# Patient Record
Sex: Male | Born: 1955
Health system: Southern US, Community
[De-identification: ages and names within clinical notes are randomized; demographics above are authoritative.]

## PROBLEM LIST (undated history)

## (undated) DIAGNOSIS — C61 Malignant neoplasm of prostate: Secondary | ICD-10-CM

## (undated) DIAGNOSIS — E119 Type 2 diabetes mellitus without complications: Secondary | ICD-10-CM

## (undated) DIAGNOSIS — G902 Horner's syndrome: Secondary | ICD-10-CM

## (undated) DIAGNOSIS — I639 Cerebral infarction, unspecified: Secondary | ICD-10-CM

## (undated) DIAGNOSIS — I10 Essential (primary) hypertension: Secondary | ICD-10-CM

## (undated) DIAGNOSIS — K219 Gastro-esophageal reflux disease without esophagitis: Secondary | ICD-10-CM

## (undated) DIAGNOSIS — I7771 Dissection of carotid artery: Secondary | ICD-10-CM

## (undated) DIAGNOSIS — E785 Hyperlipidemia, unspecified: Secondary | ICD-10-CM

## (undated) DIAGNOSIS — M1990 Unspecified osteoarthritis, unspecified site: Secondary | ICD-10-CM

## (undated) DIAGNOSIS — K2270 Barrett's esophagus without dysplasia: Secondary | ICD-10-CM

## (undated) DIAGNOSIS — E049 Nontoxic goiter, unspecified: Secondary | ICD-10-CM

## (undated) DIAGNOSIS — K5790 Diverticulosis of intestine, part unspecified, without perforation or abscess without bleeding: Secondary | ICD-10-CM

## (undated) DIAGNOSIS — Z86010 Personal history of colonic polyps: Secondary | ICD-10-CM

## (undated) HISTORY — DX: Horner's syndrome: G90.2

## (undated) HISTORY — DX: Essential (primary) hypertension: I10

## (undated) HISTORY — PX: UPPER GASTROINTESTINAL ENDOSCOPY: SHX188

## (undated) HISTORY — DX: Diverticulosis of intestine, part unspecified, without perforation or abscess without bleeding: K57.90

## (undated) HISTORY — PX: COLONOSCOPY: SHX174

## (undated) HISTORY — DX: Dissection of carotid artery: I77.71

## (undated) HISTORY — PX: COLONOSCOPY W/ BIOPSIES AND POLYPECTOMY: SHX1376

## (undated) HISTORY — DX: Hyperlipidemia, unspecified: E78.5

## (undated) HISTORY — PX: OTHER SURGICAL HISTORY: SHX169

## (undated) HISTORY — DX: Gastro-esophageal reflux disease without esophagitis: K21.9

## (undated) HISTORY — PX: KNEE SURGERY: SHX244

## (undated) HISTORY — DX: Cerebral infarction, unspecified: I63.9

## (undated) HISTORY — DX: Malignant neoplasm of prostate: C61

## (undated) HISTORY — DX: Barrett's esophagus without dysplasia: K22.70

## (undated) HISTORY — DX: Personal history of colonic polyps: Z86.010

## (undated) HISTORY — DX: Nontoxic goiter, unspecified: E04.9

---

## 1994-03-25 DIAGNOSIS — I639 Cerebral infarction, unspecified: Secondary | ICD-10-CM

## 1994-03-25 HISTORY — DX: Cerebral infarction, unspecified: I63.9

## 1999-05-02 ENCOUNTER — Encounter: Admission: RE | Admit: 1999-05-02 | Discharge: 1999-05-02 | Payer: Self-pay | Admitting: Family Medicine

## 1999-05-02 ENCOUNTER — Encounter: Payer: Self-pay | Admitting: Family Medicine

## 2001-06-18 ENCOUNTER — Encounter: Payer: Self-pay | Admitting: Family Medicine

## 2001-06-18 ENCOUNTER — Ambulatory Visit (HOSPITAL_COMMUNITY): Admission: RE | Admit: 2001-06-18 | Discharge: 2001-06-18 | Payer: Self-pay | Admitting: Family Medicine

## 2001-06-19 ENCOUNTER — Ambulatory Visit: Admission: RE | Admit: 2001-06-19 | Discharge: 2001-06-19 | Payer: Self-pay | Admitting: Family Medicine

## 2005-02-18 ENCOUNTER — Encounter: Admission: RE | Admit: 2005-02-18 | Discharge: 2005-02-18 | Payer: Self-pay | Admitting: Family Medicine

## 2005-03-25 HISTORY — PX: BIOPSY THYROID: PRO38

## 2005-10-08 ENCOUNTER — Ambulatory Visit: Payer: Self-pay | Admitting: Internal Medicine

## 2005-10-22 ENCOUNTER — Encounter: Admission: RE | Admit: 2005-10-22 | Discharge: 2005-10-22 | Payer: Self-pay | Admitting: Internal Medicine

## 2005-10-28 ENCOUNTER — Other Ambulatory Visit: Admission: RE | Admit: 2005-10-28 | Discharge: 2005-10-28 | Payer: Self-pay | Admitting: Interventional Radiology

## 2005-10-28 ENCOUNTER — Encounter (INDEPENDENT_AMBULATORY_CARE_PROVIDER_SITE_OTHER): Payer: Self-pay | Admitting: *Deleted

## 2005-10-28 ENCOUNTER — Encounter: Admission: RE | Admit: 2005-10-28 | Discharge: 2005-10-28 | Payer: Self-pay | Admitting: Internal Medicine

## 2005-11-01 ENCOUNTER — Ambulatory Visit: Payer: Self-pay | Admitting: Internal Medicine

## 2006-02-28 ENCOUNTER — Encounter: Admission: RE | Admit: 2006-02-28 | Discharge: 2006-02-28 | Payer: Self-pay | Admitting: Internal Medicine

## 2006-03-05 ENCOUNTER — Ambulatory Visit: Payer: Self-pay | Admitting: Internal Medicine

## 2006-03-05 LAB — CONVERTED CEMR LAB: Free T4: 0.9 ng/dL (ref 0.9–1.8)

## 2006-03-11 ENCOUNTER — Ambulatory Visit: Payer: Self-pay | Admitting: Internal Medicine

## 2006-03-25 DIAGNOSIS — K2270 Barrett's esophagus without dysplasia: Secondary | ICD-10-CM

## 2006-03-25 HISTORY — DX: Barrett's esophagus without dysplasia: K22.70

## 2006-04-18 ENCOUNTER — Ambulatory Visit: Payer: Self-pay | Admitting: Internal Medicine

## 2006-05-01 ENCOUNTER — Ambulatory Visit: Payer: Self-pay | Admitting: Internal Medicine

## 2006-05-06 ENCOUNTER — Encounter (INDEPENDENT_AMBULATORY_CARE_PROVIDER_SITE_OTHER): Payer: Self-pay | Admitting: Specialist

## 2006-05-06 ENCOUNTER — Ambulatory Visit: Payer: Self-pay | Admitting: Internal Medicine

## 2006-06-04 ENCOUNTER — Ambulatory Visit: Payer: Self-pay | Admitting: Internal Medicine

## 2006-08-01 ENCOUNTER — Encounter: Payer: Self-pay | Admitting: Internal Medicine

## 2006-08-01 ENCOUNTER — Ambulatory Visit: Payer: Self-pay | Admitting: Cardiology

## 2006-08-01 ENCOUNTER — Ambulatory Visit: Payer: Self-pay | Admitting: Internal Medicine

## 2006-08-01 LAB — CONVERTED CEMR LAB: BUN: 13 mg/dL (ref 6–23)

## 2006-08-25 ENCOUNTER — Telehealth: Payer: Self-pay | Admitting: Internal Medicine

## 2006-08-28 ENCOUNTER — Ambulatory Visit: Payer: Self-pay | Admitting: Internal Medicine

## 2006-08-30 LAB — CONVERTED CEMR LAB
T3, Free: 3 pg/mL (ref 2.3–4.2)
TSH: 0.34 microintl units/mL — ABNORMAL LOW (ref 0.35–5.50)

## 2006-09-02 ENCOUNTER — Encounter: Admission: RE | Admit: 2006-09-02 | Discharge: 2006-09-02 | Payer: Self-pay | Admitting: Internal Medicine

## 2007-03-03 ENCOUNTER — Ambulatory Visit: Payer: Self-pay | Admitting: Internal Medicine

## 2007-03-03 DIAGNOSIS — E1169 Type 2 diabetes mellitus with other specified complication: Secondary | ICD-10-CM | POA: Insufficient documentation

## 2007-03-03 DIAGNOSIS — K222 Esophageal obstruction: Secondary | ICD-10-CM

## 2007-03-03 DIAGNOSIS — Z8679 Personal history of other diseases of the circulatory system: Secondary | ICD-10-CM

## 2007-03-03 DIAGNOSIS — K219 Gastro-esophageal reflux disease without esophagitis: Secondary | ICD-10-CM

## 2007-03-03 DIAGNOSIS — I152 Hypertension secondary to endocrine disorders: Secondary | ICD-10-CM | POA: Insufficient documentation

## 2007-03-03 DIAGNOSIS — E782 Mixed hyperlipidemia: Secondary | ICD-10-CM

## 2007-03-03 DIAGNOSIS — Z8673 Personal history of transient ischemic attack (TIA), and cerebral infarction without residual deficits: Secondary | ICD-10-CM | POA: Insufficient documentation

## 2007-03-03 DIAGNOSIS — I10 Essential (primary) hypertension: Secondary | ICD-10-CM

## 2007-03-03 DIAGNOSIS — E042 Nontoxic multinodular goiter: Secondary | ICD-10-CM

## 2007-03-07 ENCOUNTER — Encounter (INDEPENDENT_AMBULATORY_CARE_PROVIDER_SITE_OTHER): Payer: Self-pay | Admitting: *Deleted

## 2007-04-28 ENCOUNTER — Encounter: Payer: Self-pay | Admitting: Internal Medicine

## 2007-08-08 IMAGING — CR DG CHEST 2V
2 series · 2 of 2 positions shown · non-contrast
Comparison: none

CLINICAL DATA: Fell on left chest.  Tenderness.  Pain. 
PA AND LATERAL CHEST:
The heart size and mediastinal contours are within normal limits.  Both lungs are clear.  The visualized skeletal structures are unremarkable.

[w chest pa]
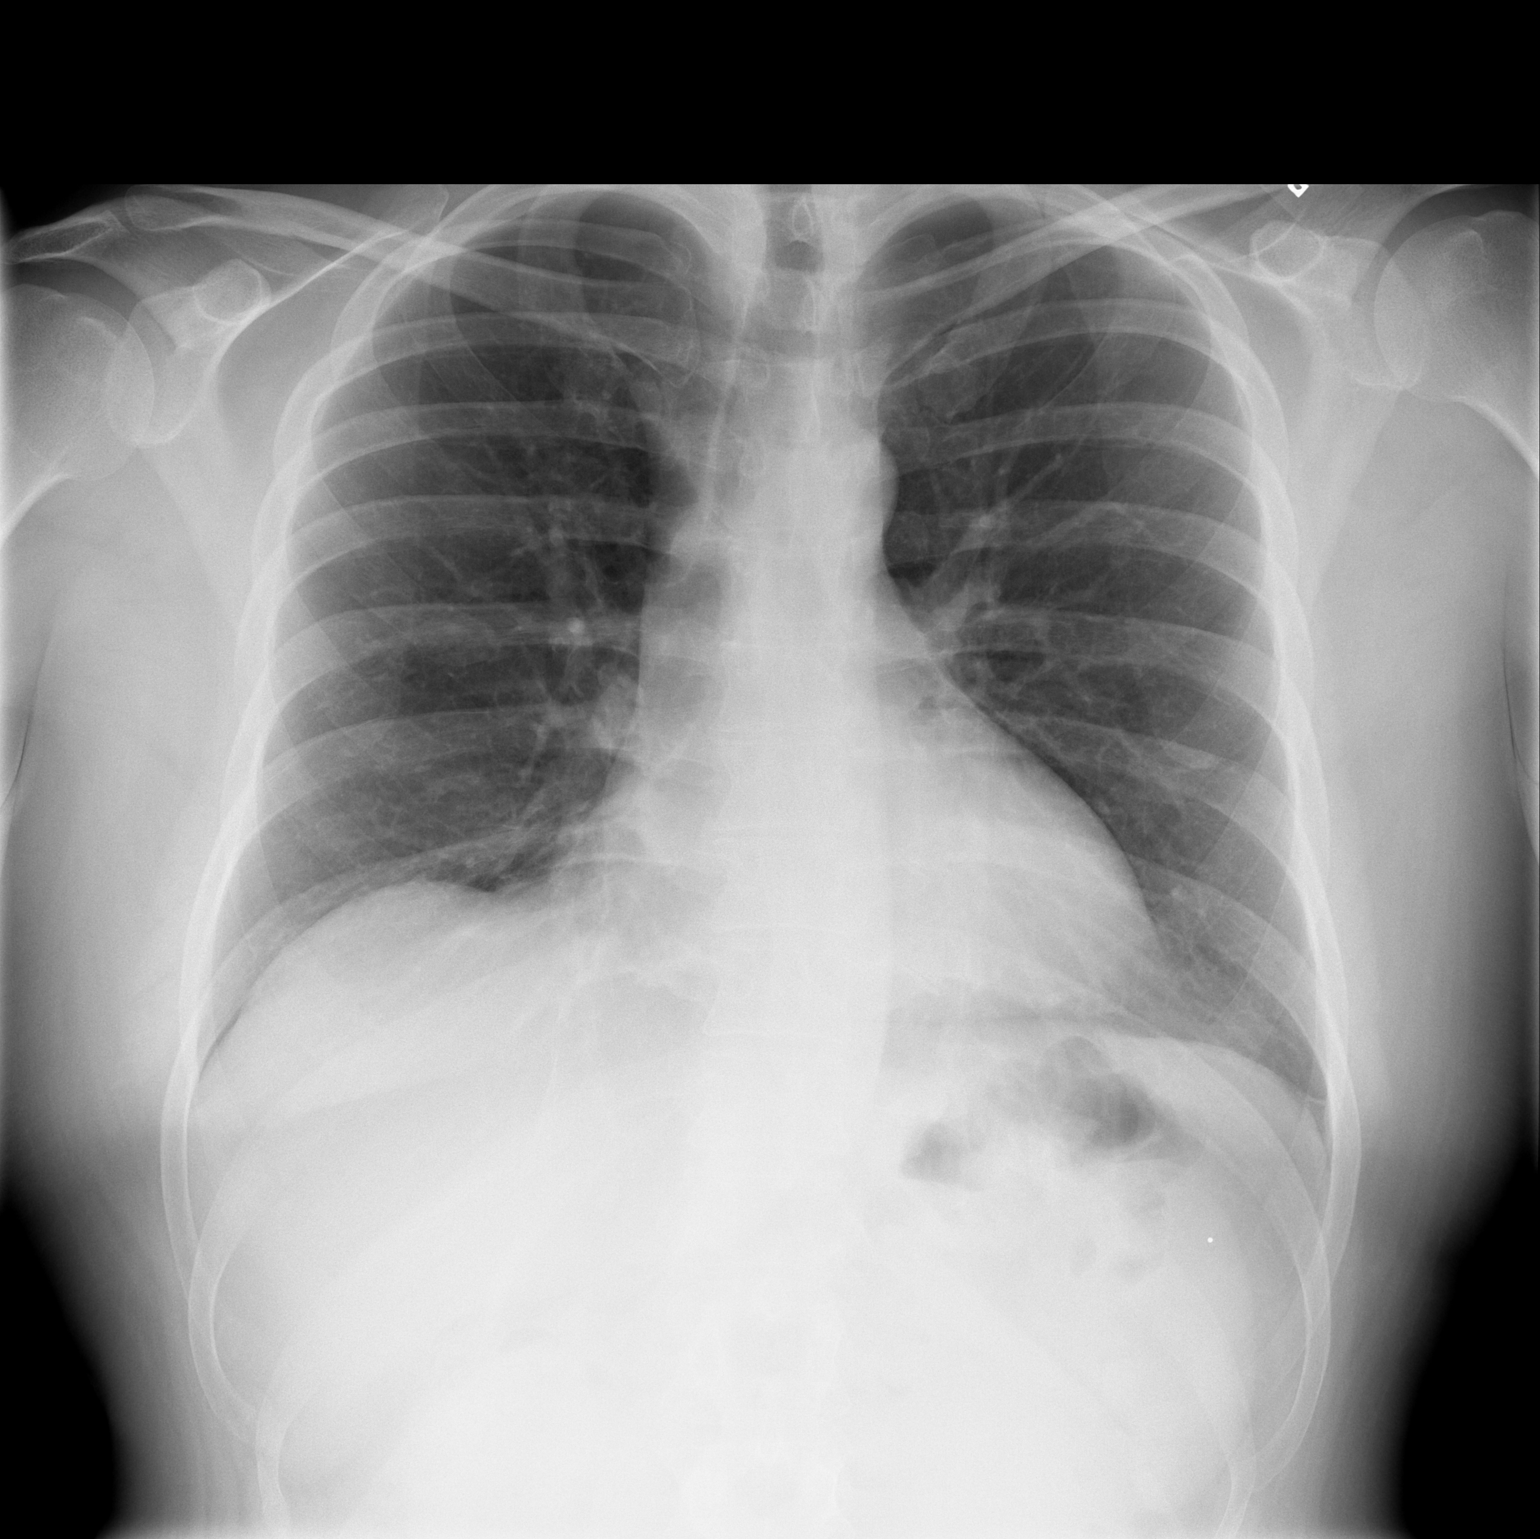

[w chest lat]
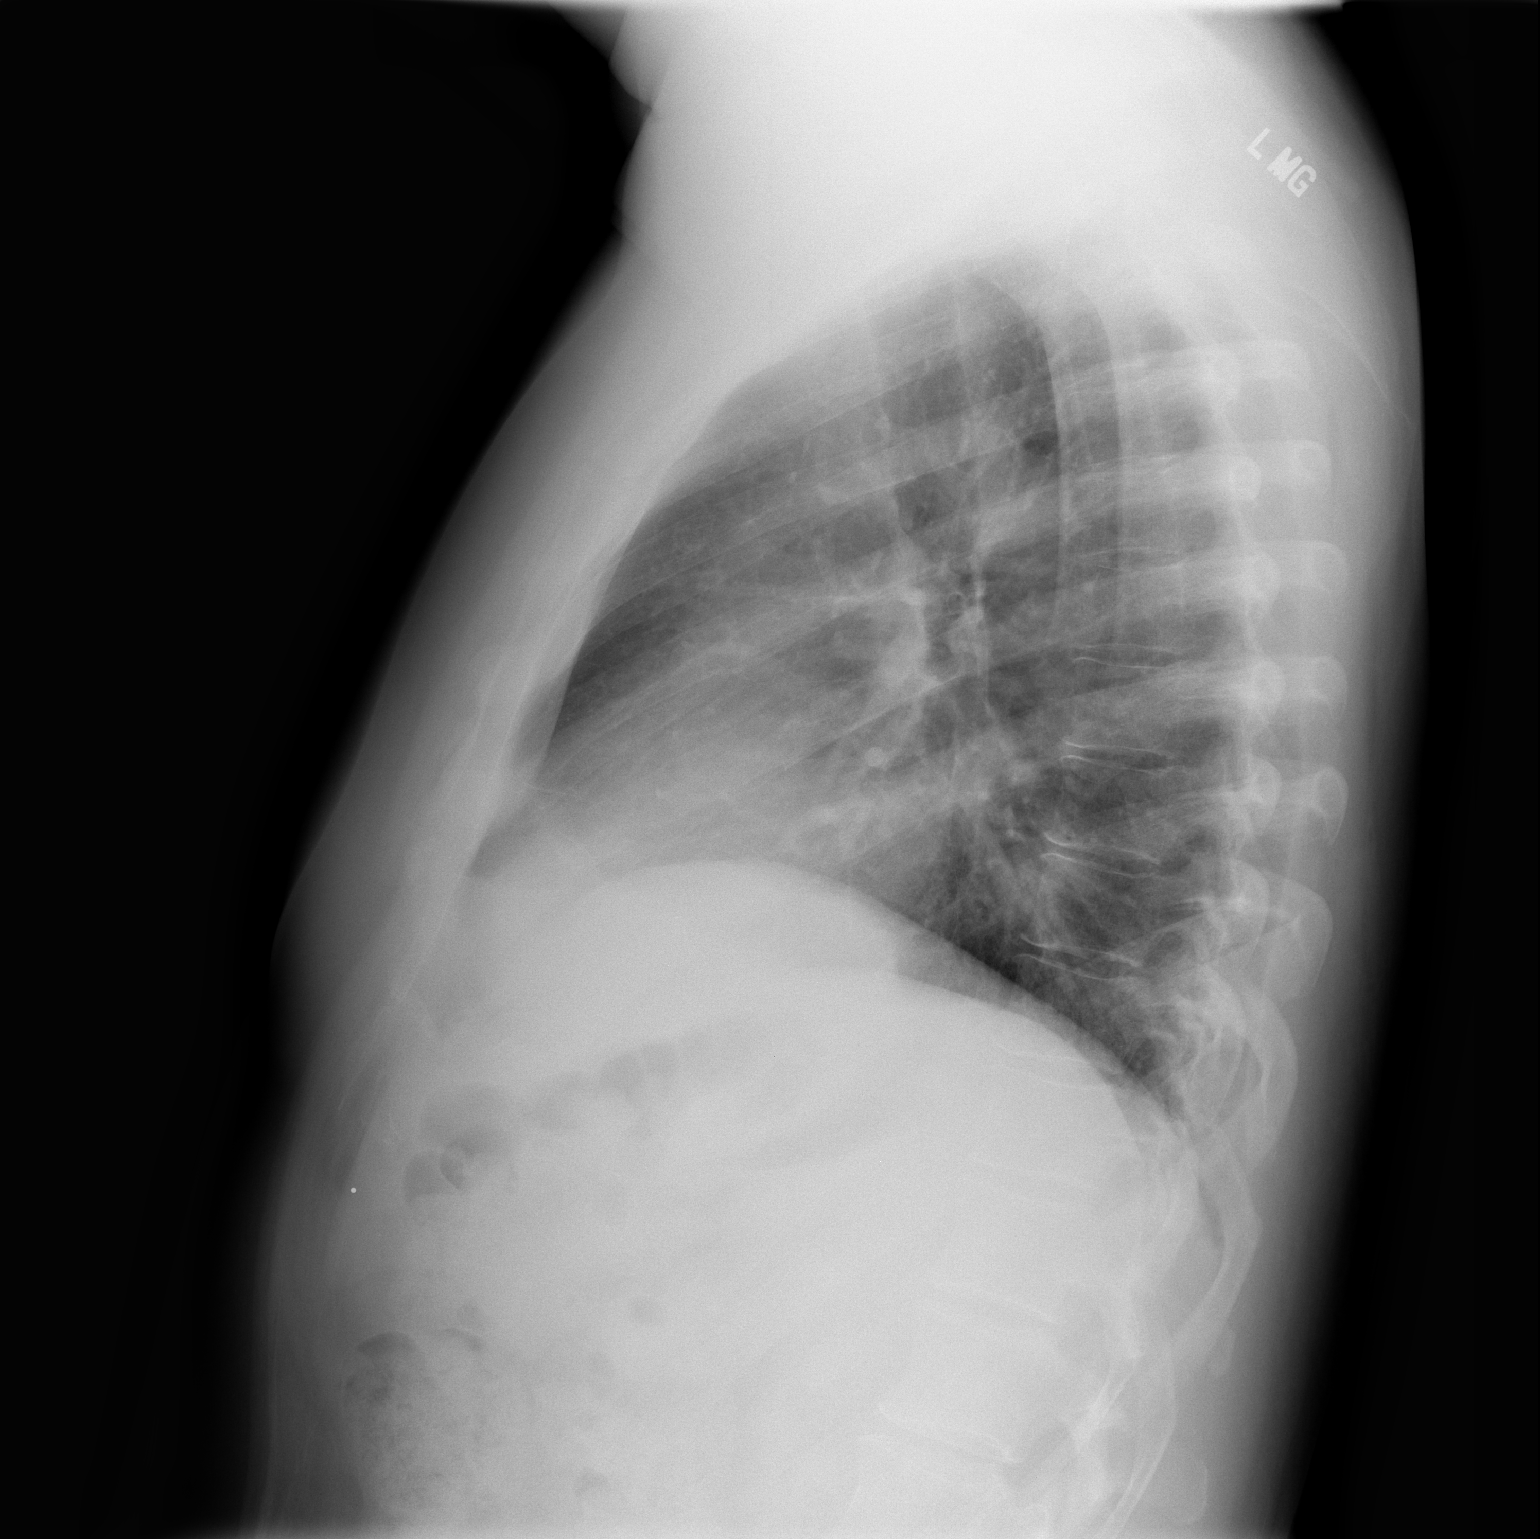

[2 of 2 positions shown; findings below may reference images not displayed]

IMPRESSION: No active cardiopulmonary disease.
LEFT RIB DETAIL - 3 VIEWS:
FINDINGS: Findings compatible with fracture of the very anterior aspect of the left 11th rib near the costochondral junction.
IMPRESSION: Fracture anterolateral aspect of the left 11th rib, the acuteness of which is uncertain.  This may represent an acute or even remote injury.

## 2007-08-08 IMAGING — CR DG RIBS 2V*L*
3 series · 3 of 3 positions shown · non-contrast
Comparison: none

CLINICAL DATA: Fell on left chest.  Tenderness.  Pain. 
PA AND LATERAL CHEST:
The heart size and mediastinal contours are within normal limits.  Both lungs are clear.  The visualized skeletal structures are unremarkable.

[w ribs ap/pa upper left *]
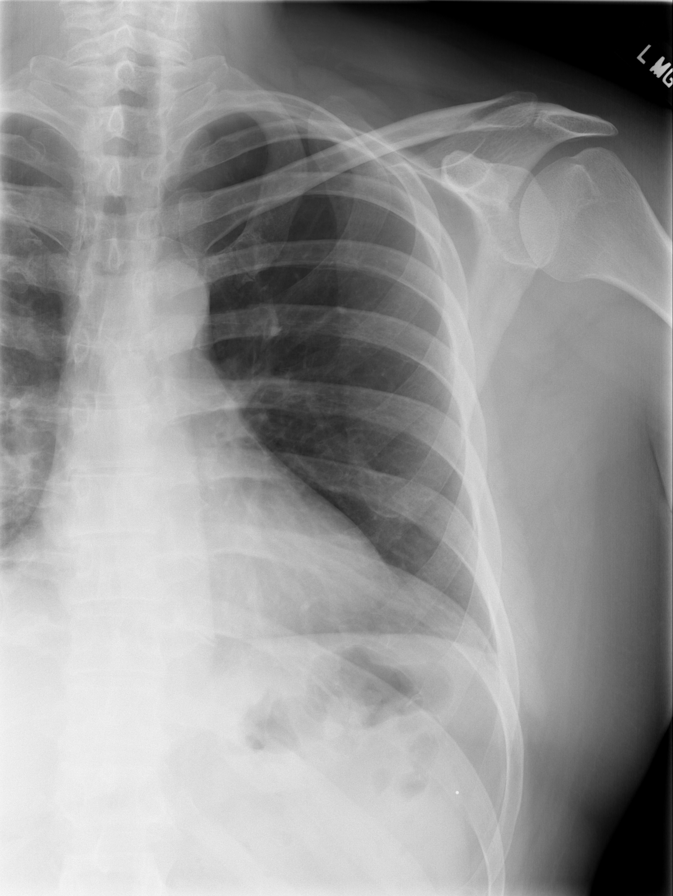

[w ribs ap/pa lower left *]
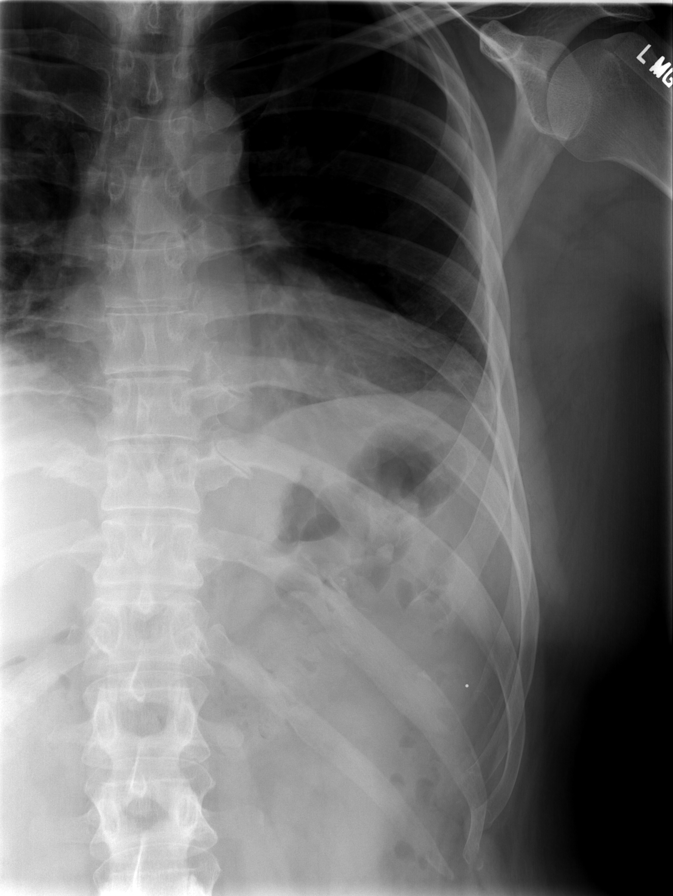

[w ribs oblique left *]
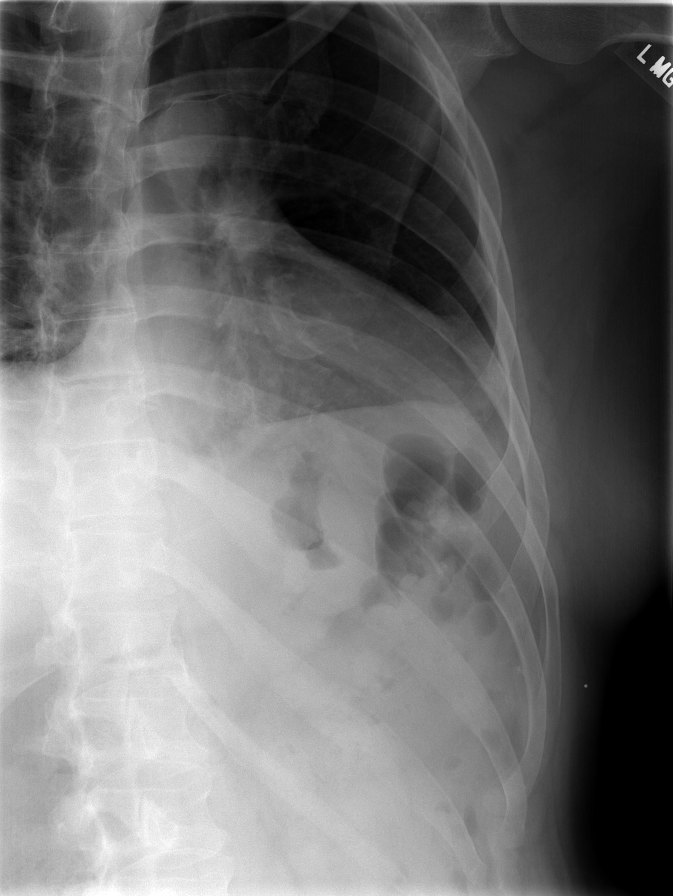

[3 of 3 positions shown; findings below may reference images not displayed]

IMPRESSION: No active cardiopulmonary disease.
LEFT RIB DETAIL - 3 VIEWS:
FINDINGS: Findings compatible with fracture of the very anterior aspect of the left 11th rib near the costochondral junction.
IMPRESSION: Fracture anterolateral aspect of the left 11th rib, the acuteness of which is uncertain.  This may represent an acute or even remote injury.

## 2007-08-13 ENCOUNTER — Telehealth: Payer: Self-pay | Admitting: Internal Medicine

## 2007-11-25 ENCOUNTER — Telehealth (INDEPENDENT_AMBULATORY_CARE_PROVIDER_SITE_OTHER): Payer: Self-pay | Admitting: *Deleted

## 2007-12-02 ENCOUNTER — Encounter: Admission: RE | Admit: 2007-12-02 | Discharge: 2007-12-02 | Payer: Self-pay | Admitting: Family Medicine

## 2007-12-03 ENCOUNTER — Ambulatory Visit: Payer: Self-pay | Admitting: Internal Medicine

## 2007-12-03 LAB — CONVERTED CEMR LAB
Free T4: 0.9 ng/dL (ref 0.6–1.6)
T3, Free: 3.1 pg/mL (ref 2.3–4.2)
TSH: 0.9 microintl units/mL (ref 0.35–5.50)

## 2007-12-04 ENCOUNTER — Telehealth (INDEPENDENT_AMBULATORY_CARE_PROVIDER_SITE_OTHER): Payer: Self-pay | Admitting: *Deleted

## 2007-12-04 ENCOUNTER — Encounter (INDEPENDENT_AMBULATORY_CARE_PROVIDER_SITE_OTHER): Payer: Self-pay | Admitting: *Deleted

## 2007-12-08 ENCOUNTER — Ambulatory Visit: Payer: Self-pay | Admitting: Internal Medicine

## 2008-01-14 ENCOUNTER — Ambulatory Visit: Payer: Self-pay | Admitting: Internal Medicine

## 2008-01-14 ENCOUNTER — Encounter: Payer: Self-pay | Admitting: Internal Medicine

## 2008-01-14 DIAGNOSIS — Z860100 Personal history of colon polyps, unspecified: Secondary | ICD-10-CM

## 2008-01-14 HISTORY — DX: Personal history of colon polyps, unspecified: Z86.0100

## 2008-01-14 HISTORY — DX: Personal history of colonic polyps: Z86.010

## 2008-01-14 LAB — HM COLONOSCOPY

## 2008-01-17 ENCOUNTER — Encounter: Payer: Self-pay | Admitting: Internal Medicine

## 2008-01-21 ENCOUNTER — Telehealth (INDEPENDENT_AMBULATORY_CARE_PROVIDER_SITE_OTHER): Payer: Self-pay | Admitting: *Deleted

## 2008-01-25 ENCOUNTER — Ambulatory Visit: Payer: Self-pay | Admitting: Internal Medicine

## 2008-02-08 ENCOUNTER — Encounter: Payer: Self-pay | Admitting: Internal Medicine

## 2008-02-29 ENCOUNTER — Encounter: Payer: Self-pay | Admitting: Internal Medicine

## 2008-03-25 DIAGNOSIS — C61 Malignant neoplasm of prostate: Secondary | ICD-10-CM

## 2008-03-25 HISTORY — DX: Malignant neoplasm of prostate: C61

## 2008-03-31 ENCOUNTER — Encounter: Payer: Self-pay | Admitting: Internal Medicine

## 2008-04-05 ENCOUNTER — Ambulatory Visit: Payer: Self-pay | Admitting: Internal Medicine

## 2008-04-13 ENCOUNTER — Telehealth: Payer: Self-pay | Admitting: Internal Medicine

## 2008-05-04 ENCOUNTER — Encounter: Payer: Self-pay | Admitting: Internal Medicine

## 2008-05-12 ENCOUNTER — Encounter (INDEPENDENT_AMBULATORY_CARE_PROVIDER_SITE_OTHER): Payer: Self-pay | Admitting: Urology

## 2008-05-12 ENCOUNTER — Observation Stay (HOSPITAL_COMMUNITY): Admission: RE | Admit: 2008-05-12 | Discharge: 2008-05-13 | Payer: Self-pay | Admitting: Urology

## 2008-05-20 ENCOUNTER — Encounter: Payer: Self-pay | Admitting: Internal Medicine

## 2008-06-15 ENCOUNTER — Telehealth (INDEPENDENT_AMBULATORY_CARE_PROVIDER_SITE_OTHER): Payer: Self-pay | Admitting: *Deleted

## 2008-06-22 ENCOUNTER — Telehealth (INDEPENDENT_AMBULATORY_CARE_PROVIDER_SITE_OTHER): Payer: Self-pay | Admitting: *Deleted

## 2008-07-01 ENCOUNTER — Encounter: Payer: Self-pay | Admitting: Internal Medicine

## 2008-09-02 ENCOUNTER — Encounter: Payer: Self-pay | Admitting: Internal Medicine

## 2008-12-02 ENCOUNTER — Encounter: Payer: Self-pay | Admitting: Internal Medicine

## 2008-12-21 ENCOUNTER — Ambulatory Visit: Payer: Self-pay | Admitting: Internal Medicine

## 2008-12-21 DIAGNOSIS — E785 Hyperlipidemia, unspecified: Secondary | ICD-10-CM

## 2008-12-21 LAB — CONVERTED CEMR LAB
ALT: 40 units/L (ref 0–53)
Basophils Relative: 0.1 % (ref 0.0–3.0)
CO2: 28 meq/L (ref 19–32)
Calcium: 9.5 mg/dL (ref 8.4–10.5)
Creatinine, Ser: 0.9 mg/dL (ref 0.4–1.5)
Eosinophils Absolute: 0.2 10*3/uL (ref 0.0–0.7)
Eosinophils Relative: 2.2 % (ref 0.0–5.0)
GFR calc non Af Amer: 94.16 mL/min (ref >60)
HDL: 56.3 mg/dL (ref >39.00)
Hemoglobin: 15.5 g/dL (ref 13.0–17.0)
Lymphocytes Relative: 33.9 % (ref 12.0–46.0)
MCHC: 34.3 g/dL (ref 30.0–36.0)
Monocytes Relative: 8.7 % (ref 3.0–12.0)
Neutrophils Relative %: 55.1 % (ref 43.0–77.0)
RBC: 4.41 M/uL (ref 4.22–5.81)
Total Bilirubin: 0.8 mg/dL (ref 0.3–1.2)
Total CHOL/HDL Ratio: 3
Total Protein: 6.9 g/dL (ref 6.0–8.3)
Triglycerides: 82 mg/dL (ref 0.0–149.0)
VLDL: 16.4 mg/dL (ref 0.0–40.0)
WBC: 8.6 10*3/uL (ref 4.5–10.5)

## 2008-12-28 ENCOUNTER — Ambulatory Visit: Payer: Self-pay | Admitting: Internal Medicine

## 2008-12-28 DIAGNOSIS — Z8546 Personal history of malignant neoplasm of prostate: Secondary | ICD-10-CM

## 2009-01-03 ENCOUNTER — Ambulatory Visit: Payer: Self-pay | Admitting: Internal Medicine

## 2009-01-03 LAB — CONVERTED CEMR LAB
OCCULT 1: NEGATIVE
OCCULT 2: NEGATIVE

## 2009-01-04 ENCOUNTER — Encounter (INDEPENDENT_AMBULATORY_CARE_PROVIDER_SITE_OTHER): Payer: Self-pay | Admitting: *Deleted

## 2009-01-09 ENCOUNTER — Telehealth (INDEPENDENT_AMBULATORY_CARE_PROVIDER_SITE_OTHER): Payer: Self-pay | Admitting: *Deleted

## 2009-01-10 ENCOUNTER — Encounter: Admission: RE | Admit: 2009-01-10 | Discharge: 2009-01-10 | Payer: Self-pay | Admitting: Internal Medicine

## 2009-01-17 ENCOUNTER — Encounter (INDEPENDENT_AMBULATORY_CARE_PROVIDER_SITE_OTHER): Payer: Self-pay | Admitting: *Deleted

## 2009-03-25 DIAGNOSIS — I7771 Dissection of carotid artery: Secondary | ICD-10-CM

## 2009-03-25 HISTORY — DX: Dissection of carotid artery: I77.71

## 2009-03-25 HISTORY — PX: OTHER SURGICAL HISTORY: SHX169

## 2009-03-25 HISTORY — PX: CAROTID ARTERY ANGIOPLASTY: SHX1300

## 2009-04-11 ENCOUNTER — Encounter: Payer: Self-pay | Admitting: Internal Medicine

## 2009-05-08 ENCOUNTER — Encounter (INDEPENDENT_AMBULATORY_CARE_PROVIDER_SITE_OTHER): Payer: Self-pay | Admitting: *Deleted

## 2009-06-02 ENCOUNTER — Encounter: Payer: Self-pay | Admitting: Internal Medicine

## 2009-06-29 ENCOUNTER — Encounter (INDEPENDENT_AMBULATORY_CARE_PROVIDER_SITE_OTHER): Payer: Self-pay | Admitting: *Deleted

## 2009-06-30 ENCOUNTER — Ambulatory Visit: Payer: Self-pay | Admitting: Internal Medicine

## 2009-07-04 ENCOUNTER — Ambulatory Visit: Payer: Self-pay | Admitting: Internal Medicine

## 2009-07-10 ENCOUNTER — Ambulatory Visit: Payer: Self-pay | Admitting: Internal Medicine

## 2009-07-10 LAB — CONVERTED CEMR LAB
AST: 32 units/L (ref 0–37)
Alkaline Phosphatase: 65 units/L (ref 39–117)
Total Bilirubin: 0.5 mg/dL (ref 0.3–1.2)
Total CHOL/HDL Ratio: 3
Triglycerides: 97 mg/dL (ref 0.0–149.0)

## 2009-07-15 ENCOUNTER — Encounter: Payer: Self-pay | Admitting: Internal Medicine

## 2009-12-06 ENCOUNTER — Ambulatory Visit: Payer: Self-pay | Admitting: Internal Medicine

## 2009-12-06 ENCOUNTER — Encounter: Payer: Self-pay | Admitting: Internal Medicine

## 2009-12-06 DIAGNOSIS — K2270 Barrett's esophagus without dysplasia: Secondary | ICD-10-CM | POA: Insufficient documentation

## 2010-01-01 ENCOUNTER — Ambulatory Visit: Payer: Self-pay | Admitting: Diagnostic Radiology

## 2010-01-01 ENCOUNTER — Ambulatory Visit (HOSPITAL_BASED_OUTPATIENT_CLINIC_OR_DEPARTMENT_OTHER): Admission: RE | Admit: 2010-01-01 | Discharge: 2010-01-01 | Payer: Self-pay | Admitting: Internal Medicine

## 2010-01-01 ENCOUNTER — Ambulatory Visit: Payer: Self-pay | Admitting: Internal Medicine

## 2010-01-01 DIAGNOSIS — R05 Cough: Secondary | ICD-10-CM

## 2010-01-04 ENCOUNTER — Encounter: Payer: Self-pay | Admitting: Internal Medicine

## 2010-01-04 ENCOUNTER — Ambulatory Visit: Payer: Self-pay | Admitting: Internal Medicine

## 2010-01-04 ENCOUNTER — Telehealth: Payer: Self-pay | Admitting: Internal Medicine

## 2010-01-04 DIAGNOSIS — G909 Disorder of the autonomic nervous system, unspecified: Secondary | ICD-10-CM | POA: Insufficient documentation

## 2010-01-04 LAB — CONVERTED CEMR LAB
CO2: 25 meq/L (ref 19–32)
Calcium: 9.7 mg/dL (ref 8.4–10.5)
Creatinine, Ser: 0.87 mg/dL (ref 0.40–1.50)
Glucose, Bld: 152 mg/dL — ABNORMAL HIGH (ref 70–99)

## 2010-01-15 ENCOUNTER — Ambulatory Visit: Payer: Self-pay | Admitting: Internal Medicine

## 2010-01-17 ENCOUNTER — Telehealth: Payer: Self-pay | Admitting: Internal Medicine

## 2010-01-17 ENCOUNTER — Encounter: Payer: Self-pay | Admitting: Internal Medicine

## 2010-01-18 ENCOUNTER — Telehealth: Payer: Self-pay | Admitting: Internal Medicine

## 2010-01-18 ENCOUNTER — Ambulatory Visit: Payer: Self-pay | Admitting: Internal Medicine

## 2010-01-18 ENCOUNTER — Telehealth (INDEPENDENT_AMBULATORY_CARE_PROVIDER_SITE_OTHER): Payer: Self-pay | Admitting: *Deleted

## 2010-01-19 ENCOUNTER — Telehealth: Payer: Self-pay | Admitting: Internal Medicine

## 2010-01-19 ENCOUNTER — Ambulatory Visit: Payer: Self-pay | Admitting: Internal Medicine

## 2010-01-19 ENCOUNTER — Encounter: Admission: RE | Admit: 2010-01-19 | Discharge: 2010-01-19 | Payer: Self-pay | Admitting: Internal Medicine

## 2010-01-19 DIAGNOSIS — I7771 Dissection of carotid artery: Secondary | ICD-10-CM | POA: Insufficient documentation

## 2010-01-22 ENCOUNTER — Ambulatory Visit (HOSPITAL_COMMUNITY): Admission: RE | Admit: 2010-01-22 | Discharge: 2010-01-22 | Payer: Self-pay | Admitting: Interventional Radiology

## 2010-01-24 ENCOUNTER — Ambulatory Visit: Payer: Self-pay | Admitting: Internal Medicine

## 2010-02-02 ENCOUNTER — Telehealth: Payer: Self-pay | Admitting: Internal Medicine

## 2010-03-25 DIAGNOSIS — G902 Horner's syndrome: Secondary | ICD-10-CM

## 2010-03-25 HISTORY — DX: Horner's syndrome: G90.2

## 2010-04-22 LAB — CONVERTED CEMR LAB
AST: 26 units/L (ref 0–37)
Basophils Absolute: 0 10*3/uL (ref 0.0–0.1)
Bilirubin, Direct: 0.1 mg/dL (ref 0.0–0.3)
Chloride: 102 meq/L (ref 96–112)
Cholesterol, target level: 200 mg/dL
Cholesterol: 174 mg/dL (ref 0–200)
Creatinine, Ser: 0.9 mg/dL (ref 0.4–1.5)
Eosinophils Absolute: 0.1 10*3/uL (ref 0.0–0.6)
Eosinophils Relative: 0.7 % (ref 0.0–5.0)
Glucose, Bld: 99 mg/dL (ref 70–99)
HCT: 44.8 % (ref 39.0–52.0)
HDL goal, serum: 40 mg/dL
Hemoglobin: 15.6 g/dL (ref 13.0–17.0)
Hgb A1c MFr Bld: 5.5 % (ref 4.6–6.0)
MCHC: 34.7 g/dL (ref 30.0–36.0)
MCV: 100 fL (ref 78.0–100.0)
Monocytes Absolute: 0.8 10*3/uL — ABNORMAL HIGH (ref 0.2–0.7)
Neutrophils Relative %: 78.9 % — ABNORMAL HIGH (ref 43.0–77.0)
PSA: 2.24 ng/mL (ref 0.10–4.00)
Potassium: 4.1 meq/L (ref 3.5–5.1)
RBC: 4.48 M/uL (ref 4.22–5.81)
RDW: 12.3 % (ref 11.5–14.6)
Sodium: 141 meq/L (ref 135–145)
Total Bilirubin: 1 mg/dL (ref 0.3–1.2)
Total CHOL/HDL Ratio: 3.1
Total Protein: 7 g/dL (ref 6.0–8.3)
WBC: 12 10*3/uL — ABNORMAL HIGH (ref 4.5–10.5)

## 2010-04-24 NOTE — Progress Notes (Signed)
Summary: XRay Order Request  Phone Note From Other Clinic   Caller: Brandon-Dr.Digby's office Summary of Call: I spoke with brandon and Dr.Digby would like XRay of lung (Apices of lung) ordered.  Dr.Hopper I placed order for Chest Xray, patient had a xray on 01/01/2010, would you please verify if it is necessary to have patient do another XRay.    Shonna Chock CMA  January 18, 2010 9:40 AM   Follow-up for Phone Call        Per Dr.Hopper this Xray will be different, patient needs to proceed with Xray.  Wife aware and ok'd.Shonna Chock CMA  January 18, 2010 10:07 AM  Follow-up by: Shonna Chock CMA,  January 18, 2010 10:07 AM

## 2010-04-24 NOTE — Letter (Signed)
Summary: [facility]  [facility]   Imported By: Lanelle Bal 06/14/2009 11:04:52  _____________________________________________________________________  External Attachment:    Type:   Image     Comment:   External Document

## 2010-04-24 NOTE — Miscellaneous (Signed)
Summary: Orders Update   Clinical Lists Changes  Problems: Added new problem of DISSECTION OF CAROTID ARTERY (ICD-443.21) - Signed Orders: Added new Referral order of Radiology Referral (Radiology) - Signed

## 2010-04-24 NOTE — Miscellaneous (Signed)
Summary: RECALL EGD .Marland Kitchen..EM  Clinical Lists Changes

## 2010-04-24 NOTE — Progress Notes (Signed)
Summary: [employer] PRIOR AUTH ISSUE  Phone Note Outgoing Call   Call placed by: Magdalen Spatz [facility],  January 18, 2010 2:18 PM Call placed to: Insurer Summary of Call: FOR THE MRI AUTHORIZATION FOR CPT (70543-ORBIT/FACE/NECK) FAX OF AUTHORIZATION NOT YET REC'D, SO I CALLED [employer] TO GET THE AUTH #.  [employer] RECORDING IS STATING THAT THIS CASE #[id number] FOR ABOVE CPT HAS BEEN WITHDRAWN AT THIS TIME.  THE [employer] RECORDING NOW ALSO STATES THAT A REQUEST FOR CT THORAX (CPT 71260) IS AWAITING PEER TO PEER?????  I WAS UNABLE TO GET A LIVE PERSON ON THE LINE.  DID SOMEONE CANCEL THE MRI ORBIT & NOW ORDER A CT THORAX????   VERY CONFUSED AT THIS POINT, PLEASE ADVISE. Initial call taken by: Magdalen Spatz [facility],  January 18, 2010 2:22 PM  Follow-up for Phone Call        the Authorization from Dr Daphine Deutscher was for brain MRI w & w/o. HE AUTHORIZED IT !  PLEASE SCHEDULE IT. Follow-up by: Marga Melnick MD,  January 18, 2010 5:08 PM  Additional Follow-up for Phone Call Additional follow up Details #1::        THERE WAS NO AUTHORIZATION EVER REC'D BY FAX, OR ANY APPROVED MRI's ON FILE WITH [employer] WHEN I CALLED THEM TO GET THE AUTH NUMBER ON 01-18-2010.  I HAVE TO HAVE AN AUTH #, PRIOR TO SCHEDULING.  NORMALLY WITH A PEER TO PEER REVIEW, THE AUTH# IS PROVIDED IN THE PHONE NOTE.  I HAVE FINALLY GOT A PRIOR AUTH# & PT IS SCHEDULED FOR TODAY FOR AN MRI BRAIN AT 11:30AM W/[facility] & PATIENT IS AWARE.   Additional Follow-up by: Magdalen Spatz [facility]  January 19, 2010 8:09 AM

## 2010-04-24 NOTE — Progress Notes (Signed)
----   Converted from flag ---- ---- 08/11/2007 8:16 AM, Charolette Child wrote: are you still wanting her to have a f/u? ------------------------------

## 2010-04-24 NOTE — Letter (Signed)
Summary: [facility]   Imported By: Lanelle Bal 01/25/2010 12:28:14  _____________________________________________________________________  External Attachment:    Type:   Image     Comment:   External Document

## 2010-04-24 NOTE — Assessment & Plan Note (Signed)
Summary: reaction to antibiotic/dt   Vital Signs:  Patient profile:   55 year old male Height:      68 inches Weight:      214.75 pounds BMI:     32.77 O2 Sat:      98 % on Room air Temp:     97.9 degrees F oral Pulse rate:   68 / minute Pulse rhythm:   regular Resp:     18 per minute BP sitting:   132 / 80  (right arm) Cuff size:   large  Vitals Entered By: Glendell Docker CMA (January 04, 2010 2:18 PM)  O2 Flow:  Room air CC: Reaction to antibiotic Is Patient Diabetic? No Pain Assessment Patient in pain? no      Comments     Primary Care Provider:  Marga Melnick, MD  CC:  Reaction to antibiotic.  History of Present Illness: 54 y/o white male recently seen for bronchitis complains left eye will not dialted and has trouble opening it.  onset was Tuesday night. He started the antibiotic on Monday night. He states he thought that it would go away , but has symptoms have not resolved.  respiratory symptoms much better no headache no neck pain no neurologic complaints  cxr -  01/01/2010 Mild central peribronchial thickening.  This can correlate with bronchitis in the appropriate clinical setting or may simply be the result of history of smoking.  No other focal or acute abnormality is seen.  Preventive Screening-Counseling & Management  Alcohol-Tobacco     Smoking Status: current  Allergies: 1)  ! Codeine 2)  ! Avelox 3)  Amoxicillin (Amoxicillin) 4)  Sulfa 5)  Erythromycin  Past History:  Past Medical History: Hyperlipidemia esophgeal stricture multi nodular goiter; S/P biopsy 2006   Hypertension GERD Prostate cancer, hx of, Dr Herma Carson Esophagus 2008 Diverticulosis Hemorrhoids Adenomatous Colon Polyps Stroke  Social History: Alcohol use-yes occasional  No diet Occupation: Retail banker / Air cabin crew for [employer] Married Current Smoker: 1/2 ppd - 30 yrs Regular exercise-yes: walking 1/2 mpd Daily Caffeine Use 2     Review of Systems       no neck pain  Physical Exam  General:  alert, well-developed, and well-nourished.   Eyes:  left eye miosis,  EOMI,  mild ptosis of left eye lid. pupil are reactive to light bilaterally Neck:  supple and no masses.   Lungs:  normal respiratory effort and normal breath sounds.   Heart:  normal rate, regular rhythm, and no gallop.     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 unclear is fluoroquinolone was trigger Check CT of chest - rule out pancoast tumor consider he is long standing smoker  consider MRI of brain and c spine. defer to PCP   Orders: Radiology Referral (Radiology) T-Basic Metabolic Panel [phone]) Ophthalmology Referral (Ophthalmology)  Problem # 2:  HYPERTENSION (ICD-401.9)  His updated medication list for this problem includes:    Verapamil Hcl Cr 240 Mg Tbcr (Verapamil hcl) .Marland Kitchen... Take 1 tablet by mouth once a day  Orders: T-Basic Metabolic Panel [id number])  BP today: 132/80 Prior BP: 124/80 (01/01/2010)  Prior 10 Yr Risk Heart Disease: 6 % (04/05/2008)  Labs Reviewed: K+: 3.9 (12/21/2008) Creat: : 0.9 (12/21/2008)   Chol: 174 (07/04/2009)   HDL: 54.20 (07/04/2009)   LDL: 100 (07/04/2009)   TG: 97.0 (07/04/2009)  Complete Medication List: 1)  Lipitor 40 Mg Tabs (Atorvastatin calcium) .... Take one tablet by mouth daily 2)  Verapamil Hcl Cr 240 Mg Tbcr (Verapamil hcl) .... Take 1 tablet by mouth once a day 3)  Nexium 40 Mg Cpdr (Esomeprazole magnesium) .Marland Kitchen.. 1 by mouth once daily 30 minutes before a meal 4)  Levitra 20 Mg Tabs (Vardenafil hcl) .... Take 1/2 -1 tablet as needed 5)  Dulera 100-5 Mcg/act Aero (Mometasone furo-formoterol fum) .... 2 puffs two times a day 6)  Benzonatate 100 Mg Caps (Benzonatate) .... One by mouth three times a day as needed cough  Patient Instructions: 1)  Please follow up with  Dr. Alwyn Ren within 1 week.  Current Allergies (reviewed today): ! CODEINE ! AVELOX AMOXICILLIN (AMOXICILLIN) SULFA ERYTHROMYCIN

## 2010-04-24 NOTE — Procedures (Signed)
Summary: GI FAILED REFFERAL  GI FAILED REFFERAL   Imported By: Freddy Jaksch 05/07/2007 15:36:48  _____________________________________________________________________  External Attachment:    Type:   Image     Comment:   External Document

## 2010-04-24 NOTE — Letter (Signed)
Summary: EGD Instructions  [facility]  [address] [place], [place] [address]   Phone: [phone]  Fax: [phone]       Eric Dillon    Mar 17, 1956    MRN: [id number]       Procedure Day /Date:  Monday  07/10/09     Arrival Time:  8:30am     Procedure Time:  9:30am     Location of Procedure:                    [facility] (4th Floor)    PREPARATION FOR ENDOSCOPY   On Monday 04/18  THE DAY OF THE PROCEDURE:  1.   No solid foods, milk or milk products are allowed after midnight the night before your procedure.  2.   Do not drink anything colored red or purple.  Avoid juices with pulp.  No orange juice.  3.  You may drink clear liquids until 7:30am  which is 2 hours before your procedure.                                                                                                CLEAR LIQUIDS INCLUDE: Water Jello Ice Popsicles Tea (sugar ok, no milk/cream) Powdered fruit flavored drinks Coffee (sugar ok, no milk/cream) Gatorade Juice: apple, white grape, white cranberry  Lemonade Clear bullion, consomm, broth Carbonated beverages (any kind) Strained chicken noodle soup Hard Candy   MEDICATION INSTRUCTIONS  Unless otherwise instructed, you should take regular prescription medications with a small sip of water as early as possible the morning of your procedure.   Additional medication instructions: n/a             OTHER INSTRUCTIONS  You will need a responsible adult at least 54 years of age to accompany you and drive you home.   This person must remain in the waiting room during your procedure.  Wear loose fitting clothing that is easily removed.  Leave jewelry and other valuables at home.  However, you may wish to bring a book to read or an iPod/MP3 player to listen to music as you wait for your procedure to start.  Remove all body piercing jewelry and leave at home.  Total time from sign-in until discharge  is approximately 2-3 hours.  You should go home directly after your procedure and rest.  You can resume normal activities the day after your procedure.  The day of your procedure you should not:   Drive   Make legal decisions   Operate machinery   Drink alcohol   Return to work  You will receive specific instructions about eating, activities and medications before you leave.    The above instructions have been reviewed and explained to me by  Sherren Kerns RN  June 30, 2009 8:52 AM'    I fully understand and can verbalize these instructions _____________________________ Date _________

## 2010-04-24 NOTE — Miscellaneous (Signed)
Summary: Orders Update   Clinical Lists Changes  Orders: Added new Referral order of Radiology Referral (Radiology) - Signed 

## 2010-04-24 NOTE — Letter (Signed)
Summary: Endoscopy Letter  [facility]  [address] [place], [place] [address]   Phone: [phone]  Fax: [phone]      May 08, 2009 MRN: [id number]   Eric Dillon [address] [place], [place]  [address]   Dear Mr. STEWART,   According to your medical record, it is time for you to schedule an Endoscopy. Endoscopic screening is recommended for patients with certain upper digestive tract conditions because of associated increased risk for cancers of the upper digestive system.  This letter has been generated based on the recommendations made at the time of your prior procedure. If you feel that in your particular situation this may no longer apply, please contact our office.  Please call our office at [phone]) to schedule this appointment or to update your records at your earliest convenience.  Thank you for cooperating with [place] to provide you with the very best care possible.   Sincerely,  Iva Boop, M.D.  [facility] [phone]

## 2010-04-24 NOTE — Letter (Signed)
Summary: [facility]  [facility]   Imported By: Lanelle Bal 12/18/2009 09:05:10  _____________________________________________________________________  External Attachment:    Type:   Image     Comment:   External Document

## 2010-04-24 NOTE — Progress Notes (Signed)
Summary: REF FOR LAB ORDER & ULTRASOUND & RX FOR VERAPAMIL   Phone Note Call from Patient Call back at Home Phone [phone]   Caller: Spouse Reason for Call: Referral Action Taken: Rx Called In Details for Reason: NEED LAB ORDER FOR THYROID,REFERRAL FOR ULTRASOUND OF THYROID,RX FOR VERAPAMIL 240 MG CALLED ING TO [facility] [id number] Summary of Call: 1)NEED LAB ORDER FOR THYROID APPT 06.05.07  2)REFERRAL FOR ULTRASOUND FOR THYROID  3)RX FOR VERAPAMIL 240 MG CALL IN T [facility] [id number]  Initial call taken by: Okey Regal [place],  August 25, 2006 11:04 AM  Follow-up for Phone Call        CHART  GIVEN TO DR Carter Kassel Follow-up by: Kandice Hams,  August 27, 2006 10:21 AM  Additional Follow-up for Phone Call Additional follow up Details #1::        chart needed   CHART IS AT YOU DESK Additional Follow-up by: Marga Melnick MD,  August 27, 2006 10:31 AM   Additional Follow-up for Phone Call Additional follow up Details #2::    CHART IS AT YOU DESK  Follow-up by: Kandice Hams,  August 27, 2006 10:40 AM  Additional Follow-up for Phone Call Additional follow up Details #3:: Details for Additional Follow-up Action Taken:  scedule [place] of thyroid  @ [facility] by Helmut Muster ([id number]); TFTs (freeT4,freeT3,TSH here; renew Verapamil X 6 months; monitor BP goal = < 130/85  pt is scheduled for US thyroid june 10 @ 9:30 documented August 29, 2006 9:07 AM at [facility] and pt is aware Additional Follow-up by: Marga Melnick MD,  August 27, 2006 10:46 AM

## 2010-04-24 NOTE — Assessment & Plan Note (Signed)
Summary: FOLLOWUP VISIT FROM DR YOO--HORNERS SYNDROME??//[facility]   Vital Signs:  Patient profile:   55 year old male Height:      67.75 inches Weight:      218 pounds BMI:     33.51 Temp:     98.1 degrees F oral Pulse rate:   72 / minute Resp:     14 per minute BP sitting:   120 / 88  (left arm) Cuff size:   large  Vitals Entered By: Shonna Chock CMA (January 15, 2010 10:21 AM) CC: Follow-up visit: Horners Syndrome   Primary Care Ashlynd Michna:  Marga Melnick, MD  CC:  Follow-up visit: Horners Syndrome.  History of Present Illness: Cough improved  with meds Rxed by Dr Artist Pais. Horner's signs also improving, but he has scratchy throat  on L & decreased sweating L face.Dr Hazle Quant to re-evaluate him 10/26. CT scan not completed due to travel obligations  Current Medications (verified): 1)  Lipitor 40 Mg Tabs (Atorvastatin Calcium) .... Take One Tablet By Mouth Daily 2)  Verapamil Hcl Cr 240 Mg Tbcr (Verapamil Hcl) .... Take 1 Tablet By Mouth Once A Day 3)  Nexium 40 Mg Cpdr (Esomeprazole Magnesium) .Marland Kitchen.. 1 By Mouth Once Daily 30 Minutes Before A Meal 4)  Levitra 20 Mg  Tabs (Vardenafil Hcl) .... Take 1/2 -1 Tablet As Needed  Allergies: 1)  ! Codeine 2)  ! Avelox 3)  Amoxicillin (Amoxicillin) 4)  Sulfa 5)  Erythromycin  Review of Systems General:  Denies chills, fever, and sweats. ENT:  Denies decreased hearing, difficulty swallowing, and hoarseness; Tinnitus is chronic . No facial pain , frontal headache or purulence. Neuro:  Denies brief paralysis, disturbances in coordination, numbness, poor balance, tingling, and weakness.  Physical Exam  General:  well-nourished,in no acute distress; alert,appropriate and cooperative throughout examination Eyes:  OD pupil > OS; ptosis OS Ears:  External ear exam shows no significant lesions or deformities.  Otoscopic examination reveals clear canals, tympanic membranes are intact bilaterally without bulging, retraction, inflammation or discharge.  Hearing is grossly normal bilaterally. Nose:  External nasal examination shows no deformity or inflammation. Nasal mucosa are pink and moist without lesions or exudates. Mouth:  Oral mucosa and oropharynx without lesions or exudates.  Teeth in good repair. Neurologic:  alert & oriented X3, cranial nerves II-XII intact except anisocoria & ptosis OS, strength normal in all extremities, sensation intact to light touch, gait normal, DTRs symmetrical and normal, and finger-to-nose normal.   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 Take one tablet by mouth daily 2)  Verapamil Hcl Cr 240 Mg Tbcr (Verapamil hcl) .... Take 1 tablet by mouth once a day 3)  Nexium 40 Mg Cpdr (Esomeprazole magnesium) .Marland Kitchen.. 1 by mouth once daily 30 minutes before a meal 4)  Levitra 20 Mg Tabs (Vardenafil hcl) .... Take 1/2 -1 tablet as needed  Patient Instructions: 1)  I defer  to Dr Hazle Quant as to need for CT scan   Orders Added: 1)  Est. Patient Level III [25956]

## 2010-04-24 NOTE — Progress Notes (Signed)
Summary: ** Recent Labs & U/S of Head/Neck**  Phone Note Outgoing Call Call back at [facility] Phone [phone]   Call placed by: Shonna Chock,  December 04, 2007 3:00 PM Call placed to: Patient Details for Reason: **RECENT LABS** **U/S NECK/HEAD** Summary of Call: Left message on machine for patient to return call when avaliable. Copy of labs mailed, U/S of Head/Neck (append) mailed./Chrae Malloy  December 04, 2007 3:00 PM   Follow-up for Phone Call        D/W PATIENT, PATIENT OK'D ALL INFORMATION-SEE APPENDS TO DOCUMENTS Follow-up by: Shonna Chock,  December 07, 2007 8:33 AM

## 2010-04-24 NOTE — Progress Notes (Signed)
Summary: PEER TO PEER REQUIRED FOR MRI ORBIT  Phone Note Outgoing Call   Call placed by: Magdalen Spatz [facility],  January 17, 2010 3:51 PM Call placed to: Database administrator of Call: IN REFERENCE TO MRI TO R/O OPTIC NERVE TRACT MASS, [employer] IS REQUIRING A PEER TO PEER REVIEW.  THIS IS EVEN THOUGH I PROVIDED CLINICAL INFO BY PHONE, I HAVE ALSO FAXED ALL RELATED NOTES FOR THIS DIAGNOSIS.  THE FAX WAS SUCCESSFULLY SENT TO [facility] CLINICAL REVIEW FAX#[phone], USING CASE#[id number].  THE PHONE # TO CALL FOR PEER TO PEER IS [phone], OPTION 4, AND REFERENCE THE ABOVE CASE #. Initial call taken by: Magdalen Spatz [facility],  January 17, 2010 3:51 PM  Follow-up for Phone Call        Dr Hazle Quant the Ophthalmologist states patient's pupillary changes have worsened  today on exam & he has diagosed "Marcus Gunn Pupil"  .He called me urgently & requested MRI . Dr Hazle Quant stated he is worried about a compressive lesion of optic nerve & chiasm. I'll  ask Dr Digby's office  to FAX his office notes to [place].I am enclosing reference on optic neuritis . Follow-up by: Marga Melnick MD,  January 17, 2010 5:18 PM  Additional Follow-up for Phone Call Additional follow up Details #1::        I have spoken with & rec'd by fax exam notes from Dr. Randon Goldsmith office.  I called back [employer], provided the exact information you provided above, along with the info from the "[employer]" printout you provided me.  Still after providing that info, [employer] states the info provided does not meet their criteria & has been placed back for "peer to peer" status.  I have faxed Dr. Randon Goldsmith exam also to [facility] this morning marked "URGENT". Additional Follow-up by: Magdalen Spatz [facility],  January 18, 2010 9:47 AM    Additional Follow-up for Phone Call Additional follow up Details #2::    Thank them very much. I shall refer this to [facility] Peer Review & to the [facility] Insurance Commissioner Follow-up by: Marga Melnick MD,  January 18, 2010 11:21 AM  Additional Follow-up  for Phone Call Additional follow up Details #3:: Details for Additional Follow-up Action Taken: Dr Daphine Deutscher has OKed MRI of brain with & w/o ; he'll FAX authorization to [phone] Additional Follow-up by: Marga Melnick MD,  January 18, 2010 1:03 PM

## 2010-04-24 NOTE — Assessment & Plan Note (Signed)
Summary: f/u from Endo--ch.    History of Present Illness Visit Type: Initial Visit Primary GI MD: Stan Head MD [facility] Primary Provider: Marga Melnick, MD Chief Complaint: Endo F/U History of Present Illness:   55 yo wm with GERD, Barrett's esophagus and esophageal ring. Last endo with dilation 54 Fr. Overall improved since dilation with 2 episodes of dysphagia to meat in last couple of months. heartbun 1-2 x/month. 2 cups coffee daily, smokes 1/2 pack/day  he has quit in past but not trying now   GI Review of Systems    Reports abdominal pain and  dysphagia with solids.      Denies acid reflux, belching, bloating, chest pain, dysphagia with liquids, heartburn, loss of appetite, nausea, vomiting, vomiting blood, weight loss, and  weight gain.      Reports rectal bleeding.     Denies anal fissure, black tarry stools, change in bowel habit, constipation, diarrhea, diverticulosis, fecal incontinence, heme positive stool, hemorrhoids, irritable bowel syndrome, jaundice, light color stool, liver problems, and  rectal pain. Preventive Screening-Counseling & Management  Alcohol-Tobacco     Smoking Status: current     Smoking Cessation Counseling: yes     Smoke Cessation Stage: precontemplative     Packs/Day: 0.5  Caffeine-Diet-Exercise     Caffeine use/day: 2     Caffeine Counseling: decrease use of caffeine    Current Medications (verified): 1)  Lipitor 40 Mg Tabs (Atorvastatin Calcium) .... Take One Tablet By Mouth Daily 2)  Verapamil Hcl Cr 240 Mg Tbcr (Verapamil Hcl) .... Take 1 Tablet By Mouth Once A Day 3)  Prevacid 30 Mg  Cpdr (Lansoprazole) .... Take One Capsule Daily 4)  Levitra 20 Mg  Tabs (Vardenafil Hcl) .... Take 1/2 -1 Tablet As Needed  Allergies (verified): 1)  ! Codeine 2)  Amoxicillin (Amoxicillin) 3)  Sulfa 4)  Erythromycin  Past History:  Past Medical History: Hyperlipidemia esophgeal stricture multi nodular goiter; S/P biopsy  2006 Hypertension GERD Prostate cancer, hx of, Dr Herma Carson Esophagus 2008 Diverticulosis Hemorrhoids Adenomatous Colon Polyps Stroke  Past Surgical History: Reviewed history from 12/28/2008 and no changes required. dissection of carotid artery with cva  1996 R knee surgery  to straigten patella endoscopy Barretts & stricture, S/P dilation  2008, Dr Leone Payor Prostatectomy, Robotic 2010  Family History: Reviewed history from 12/28/2008 and no changes required. F cva  @ 72, blood clot to brain, bypass surgery paternal grandmother cva multiple times mother diabetes  Social History: Alcohol use-yes occasional No diet Occupation:[employer] Married Current Smoker: 1/2 ppd Regular exercise-yes: walking 1/2 mpd Daily Caffeine Use 2 Packs/Day:  0.5 Caffeine use/day:  2  Vital Signs:  Patient profile:   55 year old male Height:      58 inches Weight:      214.50 pounds BMI:     44.99 Pulse rate:   76 / minute Pulse rhythm:   regular BP sitting:   140 / 88  (left arm) Cuff size:   regular  Vitals Entered By: June McMurray CMA Duncan Dull) (December 06, 2009 2:24 PM)  Physical Exam  General:  well-nourished,in no acute distress; alert,appropriate and cooperative throughout examination Lungs:  Clear throughout to auscultation. Heart:  Regular rate and rhythm; no murmurs, rubs,  or bruits. Abdomen:  Soft, nontender and nondistended. Psych:  Alert and cooperative. Normal mood and affect.   Impression & Recommendations:  Problem # 1:  ESOPHAGEAL STRICTURE (ICD-530.3) Assessment Improved still with some dysphagia so try Nexium instead of lansoprazole  Problem # 2:  GERD (ICD-530.1) Assessment: Improved Not much heartburn but changing PPI (to Nexium) because of dysphagia and ? of persistent GERD activity  Problem # 3:  BARRETTS ESOPHAGUS (ICD-530.85) Assessment: Unchanged stay on PPI  Patient Instructions: 1)  You have been changed from lansoprazole to  Nexium. When the Nexium arrives start it as written below. 2)  If you continue to have swallowing problems or heartburn changes for the worse, call us. 3)  Stop smoking and reduce caffeine. 4)  There are stop smoking programs available, call us or Dr. Alwyn Ren if you want to participate in one. 5)  Copy sent to : Marga Melnick, MD 6)  The medication list was reviewed and reconciled.  All changed / newly prescribed medications were explained.  A complete medication list was provided to the patient / caregiver. Prescriptions: NEXIUM 40 MG CPDR (ESOMEPRAZOLE MAGNESIUM) 1 by mouth once daily 30 minutes before a meal  #90 x 3   Entered and Authorized by:   Iva Boop MD, [facility]   Signed by:   Iva Boop MD, FACG on 12/06/2009   Method used:   Faxed to ...       [facility] (mail-order)             , [place]         Ph: [phone]       Fax: [phone]   RxID:   [id number]

## 2010-04-24 NOTE — Letter (Signed)
Summary: Results Follow up Letter  [facility]  [address] [place], [place] [address]   Phone: [phone]  Fax: [phone]    01/04/2009 MRN: [id number]  Eric Dillon [address] [place], [place]  [address]  Dear Eric Dillon,  The following are the results of your recent test(s):  Test         Result    Pap Smear:        Normal _____  Not Normal _____ Comments: ______________________________________________________ Cholesterol: LDL(Bad cholesterol):         Your goal is less than:         HDL (Good cholesterol):       Your goal is more than: Comments:  ______________________________________________________ Mammogram:        Normal _____  Not Normal _____ Comments:  ___________________________________________________________________ Hemoccult:        Normal __X___  Not normal _______ Comments:    _____________________________________________________________________ Other Tests:    We routinely do not discuss normal results over the telephone.  If you desire a copy of the results, or you have any questions about this information we can discuss them at your next office visit.   Sincerely,

## 2010-04-24 NOTE — Assessment & Plan Note (Signed)
Summary: FOR BP CHECK AND RESULT//PH   Vital Signs:  Patient profile:   55 year old male Weight:      214.6 pounds BMI:     32.99 Pulse rate:   60 / minute Resp:     15 per minute BP sitting:   122 / 80  (left arm) Cuff size:   large  Vitals Entered By: Shonna Chock CMA (January 24, 2010 3:01 PM) CC: Follow-up visit: B/P and recent reports, Headaches   Primary Care Provider:  Marga Melnick, MD  CC:  Follow-up visit: B/P and recent reports and Headaches.  History of Present Illness: Hypertension Follow-Up      This is a 55 year old man who presents for Hypertension follow-up  post  angiography 10/31.  The patient reports  headaches and fatigue, but denies lightheadedness, urinary frequency, and edema.  Associated symptoms include palpitations , ? anxiety.  The patient denies the following associated symptoms: chest pain, chest pressure, exercise intolerance, dyspnea, and syncope.  Adjunctive measures currently used by the patient include salt restriction. BP variable :128/75-   158/103, usually 140s/90s. .  The patient reports tearing of OS, but denies nausea, vomiting, sweats, photophobia, and phonophobia.  The headache is described as intermittent,  dull-throbbing.  The location of the pain is unilateral on the left.  Prednisone helped.  Allergies: 1)  ! Codeine 2)  Amoxicillin (Amoxicillin) 3)  Sulfa 4)  Erythromycin  Physical Exam  General:  in no acute distress; alert,appropriate and cooperative throughout examination Eyes:  Anisocoria OD > OS; EOMI  Lungs:  Normal respiratory effort, chest expands symmetrically. Lungs are clear to auscultation, no crackles or wheezes. Heart:  Normal rate and regular rhythm. S1 and S2 normal without gallop, murmur, click, rub or other extra sounds. Abdomen:  Bowel sounds positive,abdomen soft and non-tender without masses, organomegaly or hernias noted. No renal bruits or AAA Pulses:  R and L carotid,radial,dorsalis pedis and posterior  tibial pulses are full and equal bilaterally Extremities:  No clubbing, cyanosis, edema. Psych:  memory intact for recent and remote, normally interactive, and good eye contact.     Impression & Recommendations:  Problem # 1:  HEADACHE (ICD-784.0)  His updated medication list for this problem includes:    Bufferin 325 Mg Tabs (Aspirin buf(cacarb-mgcarb-mgo)) ..... Once daily with food  Problem # 2:  DISSECTION OF CAROTID ARTERY (ICD-443.21)  Problem # 3:  HORNER'S SYNDROME (ICD-337.9)  Complete Medication List: 1)  Lipitor 40 Mg Tabs (Atorvastatin calcium) .... Take one tablet by mouth daily 2)  Verapamil Hcl Cr 240 Mg Tbcr (Verapamil hcl) .... Take 1 tablet by mouth once a day 3)  Levitra 20 Mg Tabs (Vardenafil hcl) .... Take 1/2 -1 tablet as needed 4)  Prednisone 20 Mg Tabs (Prednisone) .... 0.5 tab by mouth three times a day with food 5)  Bufferin 325 Mg Tabs (Aspirin buf(cacarb-mgcarb-mgo)) .... Once daily with food 6)  Plavix 75 Mg Tabs (Clopidogrel bisulfate) .Marland Kitchen.. 1 once daily 7)  Ranitidine Hcl 150 Mg Tabs (Ranitidine hcl) .Marland Kitchen.. 1 two times a day pre meals 8)  Losartan Potassium 100 Mg Tabs (Losartan potassium) .Marland Kitchen.. 1 once daily 9)  Gabapentin 100 Mg Caps (Gabapentin) .Marland Kitchen.. 1 every 8 hrs as needed headaches  Patient Instructions: 1)  Titrate Gabapentin as discussed up to 300 mg every 8 hrs as needed . 2)  Check your Blood Pressure regularly. If it is above:  135/85 ON AVERAGE you should make an appointment. Prescriptions: GABAPENTIN  100 MG CAPS (GABAPENTIN) 1 every 8 hrs as needed headaches  #30 x 1   Entered and Authorized by:   Marga Melnick MD   Signed by:   Marga Melnick MD on 01/24/2010   Method used:   Print then Give to Patient   RxID:   [id number] LOSARTAN POTASSIUM 100 MG TABS (LOSARTAN POTASSIUM) 1 once daily  #30 x 5   Entered and Authorized by:   Marga Melnick MD   Signed by:   Marga Melnick MD on 01/24/2010   Method used:   Print then Give to  Patient   RxID:   [id number]    Orders Added: 1)  Est. Patient Level III [295[address]]  Appended Document: FOR BP CHECK AND RESULT//PH Flu Vaccine Consent Questions     Do you have a history of severe allergic reactions to this vaccine? no    Any prior history of allergic reactions to egg and/or gelatin? no    Do you have a sensitivity to the preservative Thimersol? no    Do you have a past history of Guillan-Barre Syndrome? no    Do you currently have an acute febrile illness? no    Have you ever had a severe reaction to latex? no    Vaccine information given and explained to patient? yes    Are you currently pregnant? no    Lot Number:[id number]   Exp Date:09/22/2010   Site Given  Left Deltoid IM

## 2010-04-24 NOTE — Progress Notes (Signed)
Summary: [facility] HOP--RX  Phone Note Refill Request   Refills Requested: Medication #1:  AMLODIPINE BESYLATE 5 MG TABS 1 once daily. [facility]--[phone]  Initial call taken by: Freddy Jaksch,  June 15, 2008 11:33 AM  Follow-up for Phone Call        left message for pt to call back............Marland KitchenFelecia Deloach CMA  June 16, 2008 10:54 AM  pt was given rx at [facility] need to know if he turn in to pharmacy......Marland KitchenFelecia Deloach CMA  June 16, 2008 10:57 AM  Additional Follow-up for Phone Call Additional follow up Details #1::        pt states that rx was given to local pharmacy however he now has to do mail order and need a new rx sent to them. new rx sent to pharmacy...............Marland KitchenFelecia Deloach CMA  June 16, 2008 11:07 AM      Prescriptions: AMLODIPINE BESYLATE 5 MG TABS (AMLODIPINE BESYLATE) 1 once daily  #90 x 0   Entered by:   Jeremy Johann CMA   Authorized by:   Marga Melnick MD   Signed by:   Jeremy Johann CMA on 06/16/2008   Method used:   Faxed to ...       [facility]       [address] [place], [place]  [address]       Ph: [phone]       Fax: [phone]   RxID:   [id number]

## 2010-04-24 NOTE — Progress Notes (Signed)
Summary: Possible Allergic Reaction  Phone Note Call from Patient Call back at Home Phone [phone] Call back at [phone]   Caller: Patient Call For: Dr Artist Pais Summary of Call: patient call stating he believed that he had a reaction to antibiotics that were given to him. He  states his left eye is not dilating , and will barely open. He states onset was Tuesday night. He started the antibiotic on Monday night. He states he thought that it would go away , but has symptoms have not resolved.  Initial call taken by: Glendell Docker CMA,  January 04, 2010 8:45 AM  Follow-up for Phone Call        After informing Dr Artist Pais of patient concerns, he advised that patient return for evaluation. Call was returned to patient  at [phone], no answer, a detailed voice message was left informing patient per Dr Artist Pais instructions.  Call was placed to alternate number provided by patient at [phone], patient wife was advised to have patient call to schedule. She states he has several conference calls to and will have him check his schedule and call back. Follow-up by: Glendell Docker CMA,  January 04, 2010 8:51 AM

## 2010-04-24 NOTE — Assessment & Plan Note (Signed)
Summary: lft;s elevated at other MD office//lch   Vital Signs:  Patient profile:   55 year old male Weight:      220.2 pounds Temp:     98.2 degrees F oral Pulse rate:   64 / minute Resp:     15 per minute BP sitting:   132 / 86  (left arm) Cuff size:   large  Vitals Entered By: Shonna Chock (July 04, 2009 9:33 AM) CC: 1.) Elevated LFT's per [facility]  2.) Renew meds-[facility] Comments REVIEWED MED LIST, PATIENT AGREED DOSE AND INSTRUCTION CORRECT    CC:  1.) Elevated LFT's per [facility]  2.) Renew meds-[facility].  History of Present Illness: As part of ED Drug Trial he had extensive labs done; LFTS elevated by history. Drug last taken early  02/2009; LFTs over 6 weeks both elevated. Last set  of labs was late 02/2009; he was notified him in 05/2009 & recommended he see LMD.He is not blood donor; no PMH of hepatitis or transfusions. he is on Lipitor 40 mg once daily. Two alcohol beverages / day; no excess vitamin A or Tylenol.  Allergies: 1)  ! Codeine 2)  ! * Emycin 3)  * Sulfa (Sulfonamides) Group 4)  Amoxicillin (Amoxicillin)  Review of Systems General:  Denies chills, fever, sweats, and weight loss. GI:  Denies abdominal pain and yellowish skin color; No clay colored stool . GU:  No coke colored urine.  Physical Exam  General:  well-nourished,in no acute distress; alert,appropriate and cooperative throughout examination Eyes:  No corneal or conjunctival inflammation noted.Perrla. No icterus Abdomen:  Bowel sounds positive,abdomen soft and non-tender without masses, organomegaly or hernias noted. Hepatic dullness to percussion 7 cm Skin:  Intact without suspicious lesions or rashes. No jaundice Cervical Nodes:  No lymphadenopathy noted Axillary Nodes:  No palpable lymphadenopathy Psych:  memory intact for recent and remote, normally interactive, and good eye contact.     Impression & Recommendations:  Problem # 1:  NONSPEC ELEVATION OF LEVELS OF  TRANSAMINASE/LDH (ICD-790.4)  Orders: Venipuncture ([address]) TLB-Hepatic/Liver Function Pnl (80076-HEPATIC)  Problem # 2:  HYPERLIPIDEMIA (ICD-272.2)  His updated medication list for this problem includes:    Lipitor 40 Mg Tabs (Atorvastatin calcium) .Marland Kitchen... Take one tablet by mouth daily  Orders: Venipuncture ([address]) TLB-Lipid Panel (80061-LIPID)  Complete Medication List: 1)  Lipitor 40 Mg Tabs (Atorvastatin calcium) .... Take one tablet by mouth daily 2)  Verapamil Hcl Cr 240 Mg Tbcr (Verapamil hcl) .Marland Kitchen.. 1 by mouth qd 3)  Prevacid 30 Mg Cpdr (Lansoprazole) .... Take one capsule daily 4)  Levitra 20 Mg Tabs (Vardenafil hcl) .... 1/2 -1 prn  Patient Instructions: 1)  Consume < 40 grams of HFCS sugar/ day as discussed Prescriptions: PREVACID 30 MG  CPDR (LANSOPRAZOLE) take one capsule daily  #90 x 3   Entered and Authorized by:   Marga Melnick MD   Signed by:   Marga Melnick MD on 07/04/2009   Method used:   Print then Give to Patient   RxID:   [id number] VERAPAMIL HCL CR 240 MG TBCR (VERAPAMIL HCL) 1 by mouth qd  #90 x 3   Entered and Authorized by:   Marga Melnick MD   Signed by:   Marga Melnick MD on 07/04/2009   Method used:   Print then Give to Patient   RxID:   [id number] LIPITOR 40 MG TABS (ATORVASTATIN CALCIUM) take one tablet by mouth daily  #90 x 3   Entered and Authorized by:  Marga Melnick MD   Signed by:   Marga Melnick MD on 07/04/2009   Method used:   Print then Give to Patient   RxID:   [id number]

## 2010-04-24 NOTE — Letter (Signed)
Summary: [facility]  [facility]   Imported By: Lanelle Bal 12/12/2008 10:26:23  _____________________________________________________________________  External Attachment:    Type:   Image     Comment:   External Document

## 2010-04-24 NOTE — Progress Notes (Signed)
Summary: Gabapentin  Phone Note Call from Patient Call back at [phone]   Summary of Call: Patient left message on triage that he is taking the Gabapentin 100mg  3 by mouth q 8 hrs. Patient would like prescription for 300mg . Ok to send to his pharmacy? Please advise. Initial call taken by: Lucious Groves CMA,  February 02, 2010 9:59 AM  Follow-up for Phone Call        see Rx Follow-up by: Marga Melnick MD,  February 02, 2010 10:01 AM  Additional Follow-up for Phone Call Additional follow up Details #1::        Patient notified. Additional Follow-up by: Lucious Groves CMA,  February 02, 2010 10:04 AM    New/Updated Medications: * GABAPENTIN 300 MG CAPS (GABAPENTIN) 1 every 8 hrs as needed headaches Prescriptions: GABAPENTIN 300 MG CAPS (GABAPENTIN) 1 every 8 hrs as needed headaches  #90 x 1   Entered and Authorized by:   Marga Melnick MD   Signed by:   Marga Melnick MD on 02/02/2010   Method used:   Printed then faxed to ...       [facility]  [address] [phone]* (retail)       [address]       [place], [place]  [address]       Ph: [phone]       Fax: [phone]   RxID:   [id number]

## 2010-04-24 NOTE — Procedures (Signed)
Summary: Upper Endoscopy w/DIL  Patient: Eric Dillon Note: All result statuses are Final unless otherwise noted.  Tests: (1) Upper Endoscopy w/DIL (UED)  UED Upper Endoscopy w/DIL                             DONE     [facility]     [address]     [place], [place]  [address]           ENDOSCOPY PROCEDURE REPORT           PATIENT:  Eric Dillon, Eric Dillon  MR#:  [id number]     BIRTHDATE:  12-Oct-1955, 53 yrs. old  GENDER:  male           ENDOSCOPIST:  Iva Boop, MD, [facility]           PROCEDURE DATE:  07/10/2009     PROCEDURE:  EGD with biopsy, Elease Hashimoto Dilation of the Esophagus     ASA CLASS:  Class II     INDICATIONS:  1) follow-up of Barrett's Esophagus  2) dysphagia     3) dilation of esophageal stricture barrett's diagnosed 2008, for     surveillance. He also complains of recurrent, intermittent     dysphagia and is having heartburn about once a week.           MEDICATIONS:   Fentanyl 50 mcg, Versed 7 mg IV     TOPICAL ANESTHETIC:  Exactacain Spray           DESCRIPTION OF PROCEDURE:   After the risks benefits and     alternatives of the procedure were thoroughly explained, informed     consent was obtained.  The [facility] [id number] endoscope was     introduced through the mouth and advanced to the second portion of     the duodenum, without limitations.  The instrument was slowly     withdrawn as the mucosa was carefully examined.     <<PROCEDUREIMAGES>>           Barrett's esophagus was found in the distal esophagus. One small     area of salmon-colored mucosa above Z-line (40cm) about 8x32mm     size. Multiple biopsies were obtained and sent to pathology.  An     esophageal ring was found in the distal esophagus. Lumen diameter     about 16 mm.  A hiatal hernia was found. It was 1 - 2 cm in size.     Moderate gastritis was found in the antrum. Erythema, mottling,     slightly friable.  The examination was otherwise normal.     Dilation was then performed at  the distal esophagus           1) Dilator:  Elease Hashimoto  Size(s):  54 French     Resistance:  moderate  Heme:  none     Appearance:           \           COMPLICATIONS:  None           ENDOSCOPIC IMPRESSION:     1) Barrett's esophagus in the distal esophagus - biopsied     2) Ring, esophageal in the distal esophagus - dilated 54 Fr     3) 1 - 2 cm hiatal hernia     4) Moderate gastritis in the antrum     5) Otherwise normal examination.  RECOMMENDATIONS:     1) Clear liquids until noon today then soft diet today.     2) Normal foods tomorrow.     3) He may need a change in his PPI therapy since he is having     breakthrough symptoms - await pathology reports.     4) reduce caffeine           REPEAT EXAM:  In for EGD, pending biopsy results.           Iva Boop, MD, [facility]           CC:  The Patient           n.     eSIGNED:   Iva Boop at 07/10/2009 10:38 AM           Paulla Dolly, [id number]  Note: An exclamation mark (!) indicates a result that was not dispersed into the flowsheet. Document Creation Date: 07/10/2009 10:39 AM _______________________________________________________________________  (1) Order result status: Final Collection or observation date-time: 07/10/2009 10:23 Requested date-time:  Receipt date-time:  Reported date-time:  Referring Physician:   Ordering Physician: Stan Head [id number]) Specimen Source:  Source: Launa Grill Order Number: [id number] Lab site:   Appended Document: Upper Endoscopy w/DIL, Barrett's ring, HH, gastritis   EGD  Procedure date:  07/10/2009  Findings:      1) Barrett's esophagus in the distal esophagus - biopsied INTESTINAL METAPLASIA NO DYSPLASIA     2) Ring, esophageal in the distal esophagus - dilated 54 Fr     3) 1 - 2 cm hiatal hernia    4) Moderate gastritis in the antrum  Procedures Next Due Date:    EGD: 07/2012   Appended Document: Upper Endoscopy w/DIL     Procedures Next  Due Date:    EGD: 07/2012

## 2010-04-24 NOTE — Assessment & Plan Note (Signed)
Summary: CONGESTION/HEA   Vital Signs:  Patient profile:   55 year old male Height:      68 inches Weight:      213.75 pounds BMI:     32.62 O2 Sat:      97 % on Room air Temp:     98.1 degrees F oral Pulse rate:   63 / minute Pulse rhythm:   regular Resp:     18 per minute BP sitting:   124 / 80  (right arm) Cuff size:   large  Vitals Entered By: Glendell Docker CMA (January 01, 2010 3:39 PM)  O2 Flow:  Room air CC: Cough Is Patient Diabetic? No Pain Assessment Patient in pain? no      Comments c/o productive cough, sinus headache, fatigue for the  past 2 weeks , taken claritin, dayquil and nyquil with no relief   Primary Care Provider:  Marga Melnick, MD  CC:  Cough.  History of Present Illness: 55 y/o white male with hx of tob usem c/o cough x 2 weeks congestion - upper resp and chest sputum looks light greenish sinus headache mild dyspnea with exertion no fever currently smoking 1/2 ppd   Preventive Screening-Counseling & Management  Alcohol-Tobacco     Smoking Status: current     Smoking Cessation Counseling: yes  Allergies: 1)  ! Codeine 2)  Amoxicillin (Amoxicillin) 3)  Sulfa 4)  Erythromycin  Past History:  Past Medical History: Hyperlipidemia esophgeal stricture multi nodular goiter; S/P biopsy 2006  Hypertension GERD Prostate cancer, hx of, Dr Herma Carson Esophagus 2008 Diverticulosis Hemorrhoids Adenomatous Colon Polyps Stroke  Past Surgical History: dissection of carotid artery with cva  1996 R knee surgery  to straigten patella endoscopy Barretts & stricture, S/P dilation  2008, Dr Leone Payor Prostatectomy, Robotic 2010    Family History: F cva  @ 72, blood clot to brain, bypass surgery paternal grandmother cva multiple times mother diabetes   Social History: Alcohol use-yes occasional No diet Occupation: Retail banker / Air cabin crew for [employer] Married Current Smoker: 1/2 ppd - 30 yrs Regular  exercise-yes: walking 1/2 mpd Daily Caffeine Use 2    Review of Systems       The patient complains of dyspnea on exertion.  The patient denies fever.    Physical Exam  General:  alert and overweight-appearing.   Ears:  R ear normal and L ear normal.   Nose:  no external erythema.   Mouth:  pharyngeal erythema.   Neck:  supple and no masses.  thyroid fullness Lungs:  normal respiratory effort.  slight coarse breath sounds right mid lung field and right base Heart:  normal rate, regular rhythm, and no gallop.   Psych:  normally interactive, good eye contact, not anxious appearing, and not depressed appearing.     Impression & Recommendations:  Problem # 1:  COUGH (ICD-786.2) 54 y/o smoker with acute bronchitis.  cxr neg for acute air space dz tx with avelox,  potential side effects discussed sample of dulera MDI.  proper MDI technique reviewed strongly encouraged tobacco cessation  consider check spirometry with Dr. Alwyn Ren once bronchitis resolves Orders: T-2 View CXR, Same Day (71020.5TC)  Problem # 2:  NONTOXIC MULTINODULAR GOITER (ICD-241.1) thyroid feels full on exam.  defer repeat thyroid u/s to Dr. Alwyn Ren  Complete Medication List: 1)  Lipitor 40 Mg Tabs (Atorvastatin calcium) .... Take one tablet by mouth daily 2)  Verapamil Hcl Cr 240 Mg Tbcr (Verapamil hcl) .... Take 1  tablet by mouth once a day 3)  Nexium 40 Mg Cpdr (Esomeprazole magnesium) .Marland Kitchen.. 1 by mouth once daily 30 minutes before a meal 4)  Levitra 20 Mg Tabs (Vardenafil hcl) .... Take 1/2 -1 tablet as needed 5)  Avelox 400 Mg Tabs (Moxifloxacin hcl) .... One by mouth once daily 6)  Dulera 100-5 Mcg/act Aero (Mometasone furo-formoterol fum) .... 2 puffs two times a day 7)  Benzonatate 100 Mg Caps (Benzonatate) .... One by mouth three times a day as needed cough  Patient Instructions: 1)  Call our office if your symptoms do not  improve or gets worse. 2)  I suggest you follow up with Dr. Alwyn Ren within 2  weeks Prescriptions: BENZONATATE 100 MG CAPS (BENZONATATE) one by mouth three times a day as needed cough  #30 x 0   Entered and Authorized by:   D. Thomos Lemons DO   Signed by:   D. Thomos Lemons DO on 01/01/2010   Method used:   Electronically to        [facility] [phone]* (retail)       [address]       [place], [place]  [address]       Ph: [phone]       Fax: [phone]   RxID:   [id number] AVELOX 400 MG TABS (MOXIFLOXACIN HCL) one by mouth once daily  #10 x 0   Entered and Authorized by:   D. Thomos Lemons DO   Signed by:   D. Thomos Lemons DO on 01/01/2010   Method used:   Electronically to        [facility] [phone]* (retail)       [address]       [place], [place]  [address]       Ph: [phone]       Fax: [phone]   RxID:   [id number]   Current Allergies (reviewed today): ! CODEINE AMOXICILLIN (AMOXICILLIN) SULFA ERYTHROMYCIN

## 2010-04-24 NOTE — Letter (Signed)
Summary: [facility]   Imported By: Lanelle Bal 01/25/2010 12:27:33  _____________________________________________________________________  External Attachment:    Type:   Image     Comment:   External Document

## 2010-04-24 NOTE — Progress Notes (Signed)
Summary: HA  Phone Note Call from Patient Call back at [phone]   Summary of Call: Patient left message on triage that he was seen recently and just wanted to call b/c his HA has gotten severe. Patient would like to know if he should take Prednisone again and can he given something for pain. Please advise.  Initial call taken by: Lucious Groves CMA,  January 19, 2010 11:40 AM  Follow-up for Phone Call        prednisone 20 mg , 1/2 three times a day with food #9.Marland Kitchen Results of MRI will determine specialty referral Follow-up by: Marga Melnick MD,  January 19, 2010 12:46 PM  Additional Follow-up for Phone Call Additional follow up Details #1::        Patient notified and would like to know about pain med. Please advise. Lucious Groves CMA  January 19, 2010 2:17 PM     Additional Follow-up for Phone Call Additional follow up Details #2::    Because of codeine & sulfa allergies we're limited to NSAIDS ( Ex Ibuprofen 200 mg 2 pills every 6 hrs as needed with meals) Follow-up by: Marga Melnick MD,  January 19, 2010 2:30 PM  New/Updated Medications: PREDNISONE 20 MG TABS (PREDNISONE) 0.5 tab by mouth three times a day with food Prescriptions: PREDNISONE 20 MG TABS (PREDNISONE) 0.5 tab by mouth three times a day with food  #9 x 0   Entered by:   Lucious Groves CMA   Authorized by:   Marga Melnick MD   Signed by:   Lucious Groves CMA on 01/19/2010   Method used:   Electronically to        [facility] [phone]* (retail)       [address]       [place], [place]  [address]       Ph: [phone]       Fax: [phone]   RxID:   [id number]

## 2010-04-24 NOTE — Assessment & Plan Note (Signed)
Summary: Per Dr.Hopper F/U-scm   Vital Signs:  Patient profile:   55 year old male Weight:      218 pounds BMI:     33.51 Temp:     98.0 degrees F oral Pulse rate:   60 / minute Resp:     17 per minute BP sitting:   148 / 92  (left arm) Cuff size:   large  Vitals Entered By: Shonna Chock CMA (January 19, 2010 4:52 PM) CC: Follow-up per MD, Headaches   Primary Care Provider:  Marga Melnick, MD  CC:  Follow-up per MD and Headaches.  History of Present Illness:      MRI results reviewed & options discussed.I related conversations with Dr Imogene Burn, Vascular Surgeon & Dr Corliss Skains, Neuroradilogist. His major symptom is headache. The patient reports some  nausea, tearing of eyes, nasal congestion, and  OS photophobia, but denies vomiting, sweats, and phonophobia.  The headache is described as intermittent and throbbing.  The location of the pain is unilateral on the left.  The patient denies the following high-risk features: fever, neck pain/stiffness, vision loss or change, focal weakness, and pain worse with exertion. Rx: Tylenol, NSAIDS. PMH of allergie to codeine & sulfa.   Current Medications (verified): 1)  Lipitor 40 Mg Tabs (Atorvastatin Calcium) .... Take One Tablet By Mouth Daily 2)  Verapamil Hcl Cr 240 Mg Tbcr (Verapamil Hcl) .... Take 1 Tablet By Mouth Once A Day 3)  Nexium 40 Mg Cpdr (Esomeprazole Magnesium) .Marland Kitchen.. 1 By Mouth Once Daily 30 Minutes Before A Meal 4)  Levitra 20 Mg  Tabs (Vardenafil Hcl) .... Take 1/2 -1 Tablet As Needed 5)  Prednisone 20 Mg Tabs (Prednisone) .... 0.5 Tab By Mouth Three Times A Day With Food  Allergies: 1)  ! Codeine 2)  Amoxicillin (Amoxicillin) 3)  Sulfa 4)  Erythromycin  Past History:  Past Medical History: Hyperlipidemia Esophgeal stricture multi nodular goiter; S/P biopsy 2006   Hypertension GERD Prostate cancer, PMH  of, Dr Herma Carson Esophagus 2008 Diverticulosis Hemorrhoids Adenomatous Colon Polyps Stroke from  Dissecting R Carotid Artery 1996, [facility] by [facility]  Neurology   Past Surgical History: Dissection of carotid artery with CVA (NO   Angiogram or Surgery ) 1996 R knee surgery  to straighten patella Endoscopy Barretts & stricture, S/P dilation  2008, 2011 , Dr Leone Payor Prostatectomy, Robotic 2010    Review of Systems Neuro:  Denies brief paralysis, numbness, tingling, and weakness.  Physical Exam  General:  well-nourished,in no acute distress; alert,appropriate and cooperative throughout examination Eyes:  No corneal or conjunctival inflammation noted. EOMI.  OS ptosis & OS  pupil small. Field of  Vision grossly normal. Mouth:  Oral mucosa and oropharynx without lesions or exudates.  Teeth in good repair. No tongue  Lungs:  Normal respiratory effort, chest expands symmetrically. Lungs are clear to auscultation, no crackles or wheezes. Heart:  Normal rate and regular rhythm. S1 and S2 normal without gallop, murmur, click, rub.S4  Pulses:  R and L carotid pulses are full and equal bilaterally Extremities:  No clubbing, cyanosis, edema. Neurologic:  alert & oriented X3, cranial nerves II-XII intact except for Ophth findings, strength normal in all extremities, sensation intact to light touch, and DTRs symmetrical and normal.   Skin:  Intact without suspicious lesions or rashes Cervical Nodes:  No lymphadenopathy noted Axillary Nodes:  No palpable lymphadenopathy Psych:  memory intact for recent and remote, normally interactive, and good eye contact.     Impression &  Recommendations:  Problem # 1:  DISSECTION OF CAROTID ARTERY (ICD-443.21) subacute on R ; PMH of L in 1996  Problem # 2:  HORNER'S SYNDROME (ICD-337.9)  Problem # 3:  HYPERTENSION (ICD-401.9)  His updated medication list for this problem includes:    Verapamil Hcl Cr 240 Mg Tbcr (Verapamil hcl) .Marland Kitchen... Take 1 tablet by mouth once a day    Diovan 160 Mg Tabs (Valsartan) .Marland Kitchen... 1/2-1 once daily to keep bp <  135/85  Complete Medication List: 1)  Lipitor 40 Mg Tabs (Atorvastatin calcium) .... Take one tablet by mouth daily 2)  Verapamil Hcl Cr 240 Mg Tbcr (Verapamil hcl) .... Take 1 tablet by mouth once a day 3)  Levitra 20 Mg Tabs (Vardenafil hcl) .... Take 1/2 -1 tablet as needed 4)  Prednisone 20 Mg Tabs (Prednisone) .... 0.5 tab by mouth three times a day with food 5)  Diovan 160 Mg Tabs (Valsartan) .... 1/2-1 once daily to keep bp < 135/85 6)  Bufferin 325 Mg Tabs (Aspirin buf(cacarb-mgcarb-mgo)) .... Once daily with food 7)  Plavix 75 Mg Tabs (Clopidogrel bisulfate) .Marland Kitchen.. 1 once daily 8)  Ranitidine Hcl 150 Mg Tabs (Ranitidine hcl) .Marland Kitchen.. 1 two times a day pre meals  Patient Instructions: 1)  To ER WITH RECORDS if symptoms progress. 2)  Check your Blood Pressure regularly. Your goal = AVERAGE < 135/85. No straining or lifting. Catheter Carotid Angiogram 10/31 by Dr Corliss Skains @ [facility]. [phone]). 3)  Take 650-1000mg  of Tylenol every 4-6 hours as needed for relief of pain or comfort of fever AVOID taking more than 4000mg   in a 24 hour period (can cause liver damage in higher doses). Prescriptions: RANITIDINE HCL 150 MG TABS (RANITIDINE HCL) 1 two times a day pre meals  #60 x 2   Entered and Authorized by:   Marga Melnick MD   Signed by:   Marga Melnick MD on 01/19/2010   Method used:   Print then Give to Patient   RxID:   [id number] PLAVIX 75 MG TABS (CLOPIDOGREL BISULFATE) 1 once daily  #30 x 5   Entered and Authorized by:   Marga Melnick MD   Signed by:   Marga Melnick MD on 01/19/2010   Method used:   Print then Give to Patient   RxID:   [id number] DIOVAN 160 MG TABS (VALSARTAN) 1/2-1 once daily to keep BP < 135/85  #14 x 0   Entered and Authorized by:   Marga Melnick MD   Signed by:   Marga Melnick MD on 01/19/2010   Method used:   Samples Given   RxID:   [id number]    Orders Added: 1)  Est. Patient Level IV [[address]]

## 2010-04-24 NOTE — Letter (Signed)
Summary: [facility]  [facility]   Imported By: Lanelle Bal 05/12/2008 13:16:21  _____________________________________________________________________  External Attachment:    Type:   Image     Comment:   External Document

## 2010-04-24 NOTE — Letter (Signed)
Summary: Results Follow-up Letter  [facility]  [address] [place], [place] [address]   Phone: [phone]  Fax: [phone]    03/07/2007        Eric Dillon [address] [place], [place]  [address]  Dear Mr. HOLSTON,   The following are the results of your recent test(s):  Test     Result     Pap Smear    Normal_______  Not Normal_____       Comments: _________________________________________________________ Cholesterol LDL(Bad cholesterol):          Your goal is less than:         HDL (Good cholesterol):        Your goal is more than: _________________________________________________________ Other Tests:   _________________________________________________________  Please call for an appointment Or _________________________________________________________ _________________________________________________________ _________________________________________________________  Sincerely,  Ardyth [facility]

## 2010-04-24 NOTE — Miscellaneous (Signed)
Summary: pre visit prep order for colonoscopy  Clinical Lists Changes  Medications: Added new medication of MIRALAX   POWD (POLYETHYLENE GLYCOL 3350) As per prep  instructions. - Signed Added new medication of DULCOLAX 5 MG  TBEC (BISACODYL) Day before procedure take 2 at 3pm and 2 at 8pm. - Signed Added new medication of METOCLOPRAMIDE HCL 10 MG  TABS (METOCLOPRAMIDE HCL) As per prep instructions. - Signed Rx of MIRALAX   POWD (POLYETHYLENE GLYCOL 3350) As per prep  instructions.;  #255gm x 0;  Signed;  Entered by: Alease Frame RN;  Authorized by: Iva Boop MD;  Method used: Electronically to [facility] [phone]*, [address] [place], [place]  , Ph: [phone], Fax: [phone] Rx of DULCOLAX 5 MG  TBEC (BISACODYL) Day before procedure take 2 at 3pm and 2 at 8pm.;  #4 x 0;  Signed;  Entered by: Alease Frame RN;  Authorized by: Iva Boop MD;  Method used: Electronically to [facility] [phone]*, [address], [place], [place]  , Ph: [phone], Fax: [phone] Rx of METOCLOPRAMIDE HCL 10 MG  TABS (METOCLOPRAMIDE HCL) As per prep instructions.;  #2 x 0;  Signed;  Entered by: Alease Frame RN;  Authorized by: Iva Boop MD;  Method used: Electronically to [facility] [phone]*, [address] [place], [place]  , Ph: [phone], Fax: [phone] Observations: Added new observation of ALLERGY REV: Done (12/08/2007 8:23)    Prescriptions: METOCLOPRAMIDE HCL 10 MG  TABS (METOCLOPRAMIDE HCL) As per prep instructions.  #2 x 0   Entered by:   Alease Frame RN   Authorized by:   Iva Boop MD   Signed by:   Alease Frame RN on 12/08/2007   Method used:   Electronically to        [facility] [phone]* (retail)       [address]       [place], [place]         Ph: [phone]       Fax: [phone]   RxID:   [id number] DULCOLAX 5 MG  TBEC (BISACODYL) Day before procedure take 2 at 3pm and 2 at 8pm.  #4 x 0   Entered by:   Alease Frame  RN   Authorized by:   Iva Boop MD   Signed by:   Alease Frame RN on 12/08/2007   Method used:   Electronically to        [facility] [phone]* (retail)       [address]       [place], [place]         Ph: [phone]       Fax: [phone]   RxID:   [id number] MIRALAX   POWD (POLYETHYLENE GLYCOL 3350) As per prep  instructions.  #255gm x 0   Entered by:   Alease Frame RN   Authorized by:   Iva Boop MD   Signed by:   Alease Frame RN on 12/08/2007   Method used:   Electronically to        [facility] [phone]* (retail)       [address]       [place], [place]         Ph: [phone]       Fax: [phone]   RxID:   [id number]

## 2010-04-24 NOTE — Letter (Signed)
Summary: Patient Notice-[facility]  [address] [place], [place] [address]   Phone: [phone]  Fax: [phone]        July 15, 2009 MRN: [id number]    MOZELL HARDACRE [address] [place], [place]  [address]    Dear Mr. GIAMMARCO,  I am pleased to inform you that the biopsies taken during your recent endoscopic examination did not show any evidence of cancer upon pathologic examination.  However, your biopsies indicate you have a condition known as Barrett's esophagus. While not cancer, it is pre-cancerous (can progress to cancer) and needs to be monitored with repeat endoscopic examination and biopsies.  Fortunately, it is quite rare that this develops into cancer, but careful monitoring of the condition along with taking your medication as prescribed is important in reducing the risk of developing cancer.  It is my recommendation that you have a repeat upper gastrointestinal endoscopic examination in 3 years.  Additional information/recommendations:  Please call [phone] to schedule a return visit to further      evaluate your condition. I would like to see you in late June or early July to reassess your symptoms and consider changing therapy.  Continue with treatment plan as outlined the day of your exam, for now.  Please call us if you have or develop heartburn, reflux symptoms, any swallowing problems, or if you have questions about your condition that have not been fully answered at this time.  Sincerely,  Iva Boop MD, [facility]  This letter has been electronically signed by your physician.  Appended Document: Patient Notice-[facility] letter mailed 4.25.11

## 2010-05-01 ENCOUNTER — Telehealth (INDEPENDENT_AMBULATORY_CARE_PROVIDER_SITE_OTHER): Payer: Self-pay | Admitting: *Deleted

## 2010-05-03 ENCOUNTER — Other Ambulatory Visit: Payer: Self-pay | Admitting: Internal Medicine

## 2010-05-03 ENCOUNTER — Encounter (INDEPENDENT_AMBULATORY_CARE_PROVIDER_SITE_OTHER): Payer: Self-pay | Admitting: *Deleted

## 2010-05-03 ENCOUNTER — Other Ambulatory Visit (INDEPENDENT_AMBULATORY_CARE_PROVIDER_SITE_OTHER): Payer: 59

## 2010-05-03 DIAGNOSIS — E782 Mixed hyperlipidemia: Secondary | ICD-10-CM

## 2010-05-03 DIAGNOSIS — I10 Essential (primary) hypertension: Secondary | ICD-10-CM

## 2010-05-03 DIAGNOSIS — T887XXA Unspecified adverse effect of drug or medicament, initial encounter: Secondary | ICD-10-CM

## 2010-05-03 LAB — BASIC METABOLIC PANEL
Calcium: 9.2 mg/dL (ref 8.4–10.5)
Creatinine, Ser: 1 mg/dL (ref 0.4–1.5)
GFR: 79.27 mL/min (ref >60.00)
Glucose, Bld: 90 mg/dL (ref 70–99)
Sodium: 138 mEq/L (ref 135–145)

## 2010-05-03 LAB — CBC WITH DIFFERENTIAL/PLATELET
Basophils Relative: 0.6 % (ref 0.0–3.0)
Eosinophils Absolute: 0.2 10*3/uL (ref 0.0–0.7)
MCHC: 34.1 g/dL (ref 30.0–36.0)
MCV: 101.7 fl — ABNORMAL HIGH (ref 78.0–100.0)
Monocytes Absolute: 0.8 10*3/uL (ref 0.1–1.0)
Neutrophils Relative %: 52.1 % (ref 43.0–77.0)
Platelets: 262 10*3/uL (ref 150.0–400.0)
RBC: 4.42 Mil/uL (ref 4.22–5.81)

## 2010-05-03 LAB — HEPATIC FUNCTION PANEL
ALT: 65 U/L — ABNORMAL HIGH (ref 0–53)
AST: 31 U/L (ref 0–37)
Bilirubin, Direct: 0.1 mg/dL (ref 0.0–0.3)
Total Bilirubin: 0.2 mg/dL — ABNORMAL LOW (ref 0.3–1.2)

## 2010-05-10 NOTE — Progress Notes (Signed)
Summary: Follow up studies  Phone Note Call from Patient Call back at [phone]   Summary of Call: Patient called the office noting that he had a previous MRI/MRA and angiogram and is now due for follow up studies. He had his angiogram on Halloween. Please advise of what additional studies the patient needs at this time. Patient notes that he will be out of town next week. Please advise. Initial call taken by: Lucious Groves CMA,  May 01, 2010 9:41 AM  Follow-up for Phone Call        recommend fasting labs : [facility] panel(1304X), hepatic panel,CBC & dif , TSH, BMET; Codes: 401.9, 272.4, 995.200. OV after these return with BP readings Follow-up by: Marga Melnick MD,  May 01, 2010 12:44 PM  Additional Follow-up for Phone Call Additional follow up Details #1::        Spoke with patient's wife and they are aware. Lab appt is 2.9.12 @ 8am. Patient will wait to hear [facility] are back and then will schedule an appt to see Hopp with BP readings.  Additional Follow-up by: Harold Barban,  May 01, 2010 4:10 PM

## 2010-05-15 ENCOUNTER — Encounter: Payer: Self-pay | Admitting: Internal Medicine

## 2010-05-15 ENCOUNTER — Ambulatory Visit (INDEPENDENT_AMBULATORY_CARE_PROVIDER_SITE_OTHER): Payer: 59 | Admitting: Internal Medicine

## 2010-05-15 DIAGNOSIS — I10 Essential (primary) hypertension: Secondary | ICD-10-CM

## 2010-05-15 DIAGNOSIS — G909 Disorder of the autonomic nervous system, unspecified: Secondary | ICD-10-CM

## 2010-05-15 DIAGNOSIS — I7771 Dissection of carotid artery: Secondary | ICD-10-CM

## 2010-05-15 DIAGNOSIS — E782 Mixed hyperlipidemia: Secondary | ICD-10-CM

## 2010-05-16 ENCOUNTER — Telehealth: Payer: Self-pay | Admitting: Internal Medicine

## 2010-05-18 ENCOUNTER — Encounter: Payer: Self-pay | Admitting: Internal Medicine

## 2010-05-22 ENCOUNTER — Other Ambulatory Visit: Payer: Self-pay | Admitting: Internal Medicine

## 2010-05-22 DIAGNOSIS — I7771 Dissection of carotid artery: Secondary | ICD-10-CM

## 2010-05-22 NOTE — Assessment & Plan Note (Signed)
Summary: discuss scheduling mri/cbs   Vital Signs:  Patient profile:   55 year old male Weight:      221.4 pounds BMI:     34.04 Temp:     98.1 degrees F oral Pulse rate:   64 / minute Resp:     14 per minute BP sitting:   120 / 78  (left arm) Cuff size:   large  Vitals Entered By: Shonna Chock CMA (May 15, 2010 11:16 AM) CC: 1.) Patient had angiogram in October 2011 and it was recommended MRI/MRA in 3 months, patient would like to have this set up  2.) Follow-up on labs-copy given   Primary Care Provider:  Marga Melnick, MD  CC:  1.) Patient had angiogram in October 2011 and it was recommended MRI/MRA in 3 months and patient would like to have this set up  2.) Follow-up on labs-copy given.  History of Present Illness:    [name] is here for F/U of carotid artery dissection; initially 1996 on R  as CVA ( Dr Noreene Filbert, Neuro)  & on L 2011 with presentation as Horner's Syndrome. Labs reviewed & risks discussed; LDL is @ goal & HDL protective.He  reports fatigue, but denies muscle aches, GI upset, abdominal pain, flushing, itching, constipation, and diarrhea.  The patient denies the following symptoms: chest pain/pressure, exercise intolerance, dypsnea, palpitations, syncope, and pedal edema.  Compliance with medications (by patient report) has been near 100%.  Dietary compliance has been fair.  The patient reports no exercise.  Adjunctive measures currently used by the patient include ASA.  He  reports lightheadedness which responded to decreasing Losartan . Rise in BP necessitated resumption of full pill. He has had some urinary frequency, but denies headaches.  Adjunctive measures currently used by the patient include salt restriction.  BP ranges : 98/59- 147/99. He needs F/U MRA of carotids.  Current Medications (verified): 1)  Lipitor 40 Mg Tabs (Atorvastatin Calcium) .... Take One Tablet By Mouth Daily 2)  Verapamil Hcl Cr 240 Mg Tbcr (Verapamil Hcl) .... Take 1 Tablet By Mouth  Once A Day 3)  Bufferin 325 Mg Tabs (Aspirin Buf(Cacarb-Mgcarb-Mgo)) .... Once Daily With Food 4)  Plavix 75 Mg Tabs (Clopidogrel Bisulfate) .Marland Kitchen.. 1 Once Daily 5)  Ranitidine Hcl 150 Mg Tabs (Ranitidine Hcl) .Marland Kitchen.. 1 Two Times A Day Pre Meals 6)  Losartan Potassium 100 Mg Tabs (Losartan Potassium) .Marland Kitchen.. 1 Once Daily 7)  Gabapentin 300 Mg Caps (Gabapentin) .Marland Kitchen.. 1 Every 8 Hrs As Needed Headaches  Allergies: 1)  ! Codeine 2)  Amoxicillin (Amoxicillin) 3)  Sulfa 4)  Erythromycin  Physical Exam  General:  well-nourished,in no acute distress; alert,appropriate and cooperative throughout examination Eyes:  No corneal or conjunctival inflammation noted. EOMI.  OD pupil > OS. Field of  Vision grossly normal.Slight ptosis OS  Mouth:  Oral mucosa and oropharynx without lesions or exudates.  Tongue not deviated Lungs:  Normal respiratory effort, chest expands symmetrically. Lungs are clear to auscultation, no crackles or wheezes. Heart:  normal rate, regular rhythm, no gallop, no rub, no JVD, no HJR, and grade 1/2 -1  /6 systolic murmur.   Abdomen:  Bowel sounds positive,abdomen soft and non-tender without masses, organomegaly or hernias noted. No AAA or bruits Pulses:  R and L carotid,radial,dorsalis pedis and posterior tibial pulses are full and equal bilaterally Extremities:  No clubbing, cyanosis, edema. Neurologic:  alert & oriented X3, strength normal in all extremities, and DTRs symmetrical and normal.   Skin:  Intact without suspicious lesions or rashes Cervical Nodes:  No lymphadenopathy noted Axillary Nodes:  No palpable lymphadenopathy Psych:  memory intact for recent and remote, normally interactive, and good eye contact.     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 1 once daily His updated medication list for this problem includes:    Verapamil Hcl Cr 240 Mg Tbcr (Verapamil hcl) .Marland Kitchen... Take 1 tablet by mouth once a day    Ramipril 2.5 Mg Caps (Ramipril) .Marland Kitchen... 1 once daily - two times a day to keep bp < 135/85 on average  Problem # 4:  HYPERLIPIDEMIA (ICD-272.2) LDL @ goal ; TG mildly elevated His updated medication list for this problem includes:    Lipitor 40 Mg Tabs (Atorvastatin calcium) .Marland Kitchen... Take one tablet by mouth daily  Complete Medication List: 1)  Lipitor 40 Mg Tabs (Atorvastatin calcium) .... Take one tablet by mouth daily 2)  Verapamil Hcl Cr 240 Mg Tbcr (Verapamil hcl) .... Take 1 tablet by mouth once a day 3)  Bufferin 325 Mg Tabs (Aspirin buf(cacarb-mgcarb-mgo)) .... Once daily with food 4)  Plavix 75 Mg Tabs (Clopidogrel bisulfate) .Marland Kitchen.. 1 once daily 5)  Ranitidine Hcl 150 Mg Tabs (Ranitidine hcl) .Marland Kitchen.. 1 two times a day pre meals 6)  Gabapentin 300 Mg Caps (gabapentin)  .Marland Kitchen.. 1 every 8 hrs as needed headaches 7)  Ramipril 2.5 Mg Caps (Ramipril) .Marland Kitchen.. 1 once daily - two times a day to keep bp < 135/85 on average  Patient Instructions: 1)  Check your Blood Pressure regularly.  Your goal =: 135/85 ON AVERAGE  . Call your plan to inquire [facility] to [facility]. Prescriptions: RAMIPRIL 2.5 MG CAPS (RAMIPRIL) 1 once daily - two times a day to keep BP < 135/85 ON AVERAGE  #60 x 5   Entered and Authorized by:   Marga Melnick MD   Signed by:   Marga Melnick MD on 05/15/2010   Method used:   Electronically to        [id number] (retail)       [address]       [place], [place]  [address]       Ph: [phone]       Fax: [phone]   RxID:   [id number]    Orders Added: 1)  Est. Patient Level IV [44010] 2)  Radiology Referral  [Radiology]

## 2010-05-22 NOTE — Progress Notes (Addendum)
Summary: MRI/MRA CONTRAST  Phone Note Outgoing Call   Call placed by: Magdalen Spatz [facility],  May 16, 2010 4:28 PM Call placed to: Specialist Summary of Call: IN REFERENCE TO MRA/MRI CAROTIDS, RADIOLOGY NEEDS TO KNOW ABOUT CONTRAST FOR THE MRI.  WITH, WITHOUT, OR BOTH PLEASE? Initial call taken by: Magdalen Spatz [facility]  May 16, 2010 4:35 PM  Follow-up for Phone Call        BOTH per Dr.Hopper Follow-up by: Shonna Chock CMA,  May 16, 2010 4:54 PM     Appended Document: MRI/MRA CONTRAST Authorization # [id number] for MRI/MRA .

## 2010-05-26 ENCOUNTER — Ambulatory Visit
Admission: RE | Admit: 2010-05-26 | Discharge: 2010-05-26 | Disposition: A | Discharge status: Home / Self Care | Payer: 59 | Source: Ambulatory Visit | Attending: Internal Medicine | Admitting: Internal Medicine

## 2010-05-26 ENCOUNTER — Other Ambulatory Visit: Payer: 59

## 2010-05-26 DIAGNOSIS — I7771 Dissection of carotid artery: Secondary | ICD-10-CM

## 2010-05-26 MED ORDER — GADOBENATE DIMEGLUMINE 529 MG/ML IV SOLN
20.0000 mL | Freq: Once | INTRAVENOUS | Status: AC | PRN
  Administered 2010-05-26: 20 mL via INTRAVENOUS

## 2010-05-31 ENCOUNTER — Encounter: Payer: Self-pay | Admitting: Internal Medicine

## 2010-05-31 ENCOUNTER — Ambulatory Visit (INDEPENDENT_AMBULATORY_CARE_PROVIDER_SITE_OTHER): Payer: 59 | Admitting: Internal Medicine

## 2010-05-31 ENCOUNTER — Other Ambulatory Visit: Payer: Self-pay | Admitting: Internal Medicine

## 2010-05-31 DIAGNOSIS — E042 Nontoxic multinodular goiter: Secondary | ICD-10-CM

## 2010-05-31 DIAGNOSIS — I7771 Dissection of carotid artery: Secondary | ICD-10-CM

## 2010-06-01 ENCOUNTER — Other Ambulatory Visit: Payer: Self-pay | Admitting: Internal Medicine

## 2010-06-01 DIAGNOSIS — E049 Nontoxic goiter, unspecified: Secondary | ICD-10-CM

## 2010-06-01 LAB — CONVERTED CEMR LAB

## 2010-06-01 LAB — TSH: TSH: 0.22 u[IU]/mL — ABNORMAL LOW (ref 0.35–5.50)

## 2010-06-01 LAB — T4, FREE: Free T4: 1.02 ng/dL (ref 0.60–1.60)

## 2010-06-01 LAB — T3, FREE: T3, Free: 3 pg/mL (ref 2.3–4.2)

## 2010-06-05 NOTE — Letter (Signed)
Summary: E-mail from [facility]  E-mail from [facility]   Imported By: Maryln Gottron 05/29/2010 10:30:50  _____________________________________________________________________  External Attachment:    Type:   Image     Comment:   External Document

## 2010-06-05 NOTE — Assessment & Plan Note (Signed)
Summary: discuss mri/cbs   Vital Signs:  Patient profile:   55 year old male Weight:      224.4 pounds BMI:     34.50 Temp:     98.3 degrees F oral Pulse rate:   60 / minute Resp:     14 per minute BP sitting:   116 / 72  (left arm) Cuff size:   large  Vitals Entered By: Shonna Chock CMA (May 31, 2010 3:05 PM) CC: Discuss MRI (patient with mailed copy)    Primary Care Provider:  Marga Melnick, MD  CC:  Discuss MRI (patient with mailed copy) .  History of Present Illness:    MRA & old thyroid US reviewed; dissections are healed bilaterally. [place] 10/10 vs 09/09 essentially unchanged. Monitor discussed; [place] & TFTs. Dr Jerilee Field will be consulted a to long term best management of  carotid artery   issues.  Current Medications (verified): 1)  Lipitor 40 Mg Tabs (Atorvastatin Calcium) .... Take One Tablet By Mouth Daily 2)  Verapamil Hcl Cr 240 Mg Tbcr (Verapamil Hcl) .... Take 1 Tablet By Mouth Once A Day 3)  Bufferin 325 Mg Tabs (Aspirin Buf(Cacarb-Mgcarb-Mgo)) .... Once Daily With Food 4)  Plavix 75 Mg Tabs (Clopidogrel Bisulfate) .Marland Kitchen.. 1 Once Daily 5)  Ranitidine Hcl 150 Mg Tabs (Ranitidine Hcl) .Marland Kitchen.. 1 Two Times A Day Pre Meals 6)  Gabapentin 300 Mg Caps (Gabapentin) .Marland Kitchen.. 1 Every 8 Hrs As Needed Headaches 7)  Ramipril 2.5 Mg Caps (Ramipril) .Marland Kitchen.. 1 Once Daily - Two Times A Day To Keep Bp < 135/85 On Average  Allergies: 1)  ! Codeine 2)  Amoxicillin (Amoxicillin) 3)  Sulfa 4)  Erythromycin  Review of Systems General:  Denies weight loss; Some fatigue . Eyes:  Denies blurring, double vision, and vision loss-both eyes. ENT:  Denies difficulty swallowing and hoarseness. CV:  Denies palpitations. GI:  Denies constipation and diarrhea. Derm:  Denies changes in nail beds and hair loss. Neuro:  Denies brief paralysis, disturbances in coordination, headaches, numbness, poor balance, tingling, and weakness. Endo:  Complains of cold intolerance; denies heat intolerance.  Physical  Exam  General:  well-nourished,in no acute distress; alert,appropriate and cooperative throughout examination Neck:  No deformities, masses, or tenderness noted. R asymmetric thyromegaly w/o nodules .   Heart:  Normal rate and regular rhythm. S1 and S2 normal without gallop, murmur, click, rub . Pulses:  R and L carotid  pulses are full and equal bilaterally w/o bruits Extremities:  No onycholysis Neurologic:  alert & oriented X3 and DTRs symmetrical and normal.  No tremor Skin:  Intact without suspicious lesions or rashes Psych:  memory intact for recent and remote, normally interactive, and good eye contact.     Impression & Recommendations:  Problem # 1:  DISSECTION OF CAROTID ARTERY (ICD-443.21)  Bilateral , healed   Orders: Vascular Clinic (Vascular) T-RPR (Syphilis) ([id number])  Problem # 2:  NODULAR PROSTATE WITHOUT URINARY OBSTRUCTION (ICD-600.10)  Complete Medication List: 1)  Lipitor 40 Mg Tabs (Atorvastatin calcium) .... Take one tablet by mouth daily 2)  Verapamil Hcl Cr 240 Mg Tbcr (Verapamil hcl) .... Take 1 tablet by mouth once a day 3)  Bufferin 325 Mg Tabs (Aspirin buf(cacarb-mgcarb-mgo)) .... Once daily with food 4)  Plavix 75 Mg Tabs (Clopidogrel bisulfate) .Marland Kitchen.. 1 once daily 5)  Ranitidine Hcl 150 Mg Tabs (Ranitidine hcl) .Marland Kitchen.. 1 two times a day pre meals 6)  Gabapentin 300 Mg Caps (gabapentin)  .Marland KitchenMarland KitchenMarland Kitchen 1  every 8 hrs as needed headaches 7)  Ramipril 2.5 Mg Caps (Ramipril) .Marland Kitchen.. 1 once daily - two times a day to keep bp < 135/85 on average  Other Orders: Radiology Referral (Radiology) Venipuncture [phone]) TLB-TSH (Thyroid Stimulating Hormone) (84443-TSH) TLB-T3, Free (Triiodothyronine) (84481-T3FREE) TLB-T4 (Thyrox), Free [phone])  Patient Instructions: 1)  Check your Blood Pressure regularly. If it is above:130/80 ON AVERAGE  you should make an appointment.   Orders Added: 1)  Est. Patient Level III [46962] 2)  Vascular Clinic [Vascular] 3)   Radiology Referral [Radiology] 4)  Venipuncture [95284] 5)  TLB-TSH (Thyroid Stimulating Hormone) [84443-TSH] 6)  TLB-T3, Free (Triiodothyronine) [13244-W1UUVO] 7)  TLB-T4 (Thyrox), Free [53664-QI3K] 8)  T-RPR (Syphilis) [74259-56387]  Appended Document: discuss mri/cbs

## 2010-06-06 ENCOUNTER — Other Ambulatory Visit: Payer: 59

## 2010-06-06 LAB — CBC
HCT: 44.2 % (ref 39.0–52.0)
MCHC: 34.6 g/dL (ref 30.0–36.0)
Platelets: 285 10*3/uL (ref 150–400)
RDW: 12.8 % (ref 11.5–15.5)
WBC: 13.6 10*3/uL — ABNORMAL HIGH (ref 4.0–10.5)

## 2010-06-06 LAB — BASIC METABOLIC PANEL
BUN: 15 mg/dL (ref 6–23)
Creatinine, Ser: 0.85 mg/dL (ref 0.4–1.5)
GFR calc non Af Amer: >60 mL/min (ref >60)
Glucose, Bld: 113 mg/dL — ABNORMAL HIGH (ref 70–99)
Potassium: 4.5 mEq/L (ref 3.5–5.1)

## 2010-06-06 LAB — PROTIME-INR
INR: 0.93 (ref 0.00–1.49)
Prothrombin Time: 12.7 seconds (ref 11.6–15.2)

## 2010-06-07 ENCOUNTER — Ambulatory Visit
Admission: RE | Admit: 2010-06-07 | Discharge: 2010-06-07 | Disposition: A | Discharge status: Home / Self Care | Payer: 59 | Source: Ambulatory Visit | Attending: Internal Medicine | Admitting: Internal Medicine

## 2010-06-07 DIAGNOSIS — E049 Nontoxic goiter, unspecified: Secondary | ICD-10-CM

## 2010-06-12 ENCOUNTER — Encounter (INDEPENDENT_AMBULATORY_CARE_PROVIDER_SITE_OTHER): Payer: 59 | Admitting: Vascular Surgery

## 2010-06-12 ENCOUNTER — Other Ambulatory Visit (INDEPENDENT_AMBULATORY_CARE_PROVIDER_SITE_OTHER): Payer: 59

## 2010-06-12 DIAGNOSIS — I6529 Occlusion and stenosis of unspecified carotid artery: Secondary | ICD-10-CM

## 2010-06-12 DIAGNOSIS — I7771 Dissection of carotid artery: Secondary | ICD-10-CM

## 2010-06-13 NOTE — Consult Note (Signed)
NEW PATIENT CONSULTATION  Eric Dillon, Eric Dillon DOB:  [birth year]                                       06/12/2010 ZOXWR#:[id number]  This patient is a 55 year old male referred by Dr. Marga Melnick for history of bilateral carotid artery dissections.  The patient approximately 15 years ago suffered a right carotid dissection which resulted in a left hemiparesis involving his face, arm and leg which lasted less than 2 hours and completely resolved.  He was treated with Coumadin and has had no symptoms until October 2011 when he developed acute onset of Horner's syndrome  with a left lid lag as well as miotic pupil and a dry left eye.  He was evaluated and found to have a left internal carotid dissection.  Since then he has been treated with Plavix and has had no neurologic symptoms with slow resolution of the Horner's syndrome.  He denies any other history of stroke, TIAs or amaurosis fugax other than the present illness.  CHRONIC MEDICAL PROBLEMS: 1. Hypertension. 2. Hyperlipidemia. 3. Horner's syndrome secondary to carotid artery dissection. 4. History of tobacco abuse, has now stopped. 5. Negative for coronary artery disease, diabetes, COPD.  SOCIAL HISTORY:  He is married, has 2 children.  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  He smoked half pack of cigarettes or more per day for 30+ years.  He quit in October 2011.  He drinks occasional alcohol.  FAMILY HISTORY:  Positive for borderline diabetes in his mother and myocardial infarction with death at age 33 in his father.  Negative for stroke.  REVIEW OF SYSTEMS:  Positive for dyspnea on exertion and occasional chest discomfort.  Denies any claudication, arthritis, joint pain, wheezing, occasional dysphagia.  All other systems in complete review of systems are negative.  PHYSICAL EXAMINATION:  Blood pressure 137/85, heart rate 70, respirations 22.  General:  He is a well-developed, well-nourished male who is in no  apparent distress, alert and oriented x3.  HEENT:  Normal for age.  EOMs are intact.  His left pupil is slightly miotic compared to the right.  He has a very slight lid lag on the left.  Lungs:  Clear to auscultation.  No rhonchi or wheezing.  Cardiovascular:  Regular rhythm.  No murmurs.  Carotid pulses 3+.  No audible bruits.  Abdomen: Soft and obese.  No palpable masses.  Musculoskeletal:  Exam is free of major deformities.  Neurologic exam:  Reveals the Horner's syndrome on the left, otherwise normal.  Skin:  Free of rashes.  Lower extremity exam:  Reveals 3+ femoral, 2+ popliteal and 2+ dorsalis pedis pulses palpable bilaterally.  I reviewed the clinical records provided by Dr. Alwyn Ren as well as the cerebral angiograms done by Dr. Grandville Silos. Deveshwar in October 2011 and the MR angiogram done recently.  These findings are consistent with bilateral internal carotid dissections which began several centimeters distal to the carotid bifurcation at the C1-C2 level on the right.  The left internal carotid is quite tortuous.  Today I ordered a carotid duplex exam which I reviewed and interpreted. Internal carotids where accessible in the neck are free of disease with no evidence of stenosis or abnormal flow.  This patient has had bilateral internal carotid dissections previously treated with Coumadin and currently treated with Plavix for the left side.  A follow-up carotid duplex exam will be of no  benefit since the dissections are so high in the neck, we cannot judge healing.  Both carotid dissections will ultimately heal to some degree.  There is no surgical option for this.  It is unusual to require stenting for this problem.  I will treat him with Plavix for 12 months and at that point convert him to aspirin if he has had no further  symptoms or problems. Obviously blood pressure control and reduction in any risk factors for atherosclerosis would be the best form of management.  I  will be happy to see him on a p.r.n. basis.    Quita Skye Hart Rochester, M.D. Electronically Signed  JDL/MEDQ  D:  06/12/2010  T:  06/13/2010  Job:  [id number]

## 2010-06-14 ENCOUNTER — Other Ambulatory Visit: Payer: Self-pay | Admitting: Internal Medicine

## 2010-06-19 NOTE — Procedures (Unsigned)
CAROTID DUPLEX EXAM  INDICATION:  History of possible bilateral dissections.  HISTORY: Diabetes:  No. Cardiac:  No. Hypertension:  Yes. Smoking:  Previously, quit October 2011. Previous Surgery:  No. CV History:  Currently asymptomatic, history of CVA. Amaurosis Fugax No, Paresthesias No, Hemiparesis No                                      RIGHT             LEFT Brachial systolic pressure: Brachial Doppler waveforms: Vertebral direction of flow:        Antegrade         Antegrade DUPLEX VELOCITIES (cm/sec) CCA peak systolic                   144               125 ECA peak systolic                   81                103 ICA peak systolic                   50                74 ICA end diastolic                   18                27 PLAQUE MORPHOLOGY: PLAQUE AMOUNT:                      None              None PLAQUE LOCATION:  IMPRESSION: 1. No evidence of ICA stenosis bilaterally. 2. The distal portions of bilateral internal carotid arteries are     decreased in caliber. 3. Antegrade vertebral arteries bilaterally.  ___________________________________________ Quita Skye Hart Rochester, M.D.  EM/MEDQ  D:  06/12/2010  T:  06/12/2010  Job:  [id number]

## 2010-07-10 LAB — HEMOGLOBIN AND HEMATOCRIT, BLOOD
HCT: 34.3 % — ABNORMAL LOW (ref 39.0–52.0)
Hemoglobin: 12.1 g/dL — ABNORMAL LOW (ref 13.0–17.0)

## 2010-07-10 LAB — BASIC METABOLIC PANEL
BUN: 10 mg/dL (ref 6–23)
Calcium: 9.6 mg/dL (ref 8.4–10.5)
Chloride: 106 mEq/L (ref 96–112)
Creatinine, Ser: 0.88 mg/dL (ref 0.4–1.5)

## 2010-07-10 LAB — TYPE AND SCREEN: ABO/RH(D): O POS

## 2010-07-10 LAB — CBC
MCHC: 33.9 g/dL (ref 30.0–36.0)
MCV: 101.2 fL — ABNORMAL HIGH (ref 78.0–100.0)
Platelets: 285 10*3/uL (ref 150–400)
WBC: 9.4 10*3/uL (ref 4.0–10.5)

## 2010-07-13 ENCOUNTER — Other Ambulatory Visit: Payer: Self-pay | Admitting: Internal Medicine

## 2010-07-14 ENCOUNTER — Other Ambulatory Visit: Payer: Self-pay | Admitting: Internal Medicine

## 2010-08-07 NOTE — Op Note (Signed)
NAME:  Eric Dillon, Eric Dillon NO.:  [id number]   MEDICAL RECORD NO.:  [id number]          PATIENT TYPE:  INP   LOCATION:  1409                         FACILITY:  [facility]   PHYSICIAN:  Heloise Purpura, MD      DATE OF BIRTH:  03-Feb-1956   DATE OF PROCEDURE:  05/12/2008  DATE OF DISCHARGE:                               OPERATIVE REPORT   PREOPERATIVE DIAGNOSIS:  Clinically localized adenocarcinoma of the  prostate (clinical stage T2ANXMX).   POSTOPERATIVE DIAGNOSIS:  Clinically localized adenocarcinoma of the  prostate (clinical stage T2ANXMX).   OPERATION PERFORMED:  Robotic assisted laparoscopic radical  prostatectomy (bilateral nerve sparing).   SURGEON:  Heloise Purpura, M.D.   ASSISTANT:  Delia Chimes, NP   ANESTHESIA:  General.   COMPLICATIONS:  None.   ESTIMATED BLOOD LOSS:  300 mL.   FLUIDS REPLACED:  Intravenous fluids, 1200 mL of lactated Ringer's.   SPECIMENS:  Prostate and seminal vesicles.   DISPOSITION:  Specimens to pathology.   DRAINS:  1. A #19 Blake pelvic drain.  2. A 20 French coude catheter.   INDICATIONS:  The patient is a 54 year old gentleman with recently  diagnosed clinically localized adenocarcinoma of the prostate.  After a  discussion regarding management options for treatment, he elected to  proceed with surgical therapy and the above procedure.  The potential  risks, complications and alternative treatment options were discussed in  detail, and informed consent was obtained.   DESCRIPTION OF THE OPERATION:  The patient was brought to the operating  room and a general anesthetic was administered.  He was given  preoperative antibiotics, placed in the dorsal lithotomy position, and  prepped and draped in the usual sterile fashion.  Next, a preoperative  time out was performed.  A Foley catheter was then inserted into the  bladder.  A site was selected just to the left of the umbilicus for  placement of the camera port.  This was  placed using a standard open  Hassan technique, which allowed entry into the peritoneal cavity under  direct vision and without difficulty.  A 12 mm port was then placed and  a pneumoperitoneum established.   The zero-degree lens was used to inspect the abdomen and there was no  evidence for any intra-abdominal injuries or other abnormalities.  The  remaining ports were then placed.  Bilateral 8-mm robotic ports were  placed 10 cm lateral to and just inferior to the camera port site.  An 8-  mm robotic port was placed in the far left lateral abdominal wall.  A 5-  mm port was placed between the camera port and the right robotic port.  A 12-mm port was placed in the far right lateral abdominal wall for  laparoscopic assistance.  All ports were placed under direct vision and  without difficulty.  The surgical cart was then docked.   With the aid of the cautery scissors the bladder was reflected  posteriorly allowing entry into the space of Retzius and identification  of the endopelvic fascia and prostate.  The endopelvic fascia was  incised from the apex  back to the base of the prostate bilaterally and  the underlying levator muscle fibers were swept laterally off the  prostate thereby isolating the dorsal venous complex.  The dorsal venous  complex was then stapled and divided with a 45 mm flex ETS stapler.  The  bladder neck was identified with the aid of Foley catheter manipulation  and was divided anteriorly allowing entry into the bladder and exposure  of the Foley catheter.  The catheter balloon was deflated and the  catheter was brought into the operative field, and used to retract the  prostate anteriorly.  The posterior bladder neck was examined.  Although  there was the possibility of a median lobe based on his prostate  ultrasound at the time of biopsy, no intravesical lobe was identified  upon careful inspection of the posterior bladder neck.  The posterior  bladder neck was  then divided and dissection proceeded posteriorly  between the bladder and prostate until the vasa deferentia and seminal  vesicles were identified.  The vasa deferentia were isolated, divided  and lifted anteriorly.  The seminal vesicles were then dissected down to  their tips with care to control seminal vesicle arterial blood supply.  The seminal vesicles were then lifted anteriorly in the space between  Denonvilliers fascia and the anterior rectum was bluntly developed  thereby isolating the vascular pedicles of the prostate.   The lateral prostatic fascia was then incised bilaterally allowing the  neurovascular bundles to be released.  The vascular pedicles of the  prostate were then ligated with Hem-o-lok clips above the level of the  neurovascular bundles and were divided with sharp cold scissor  dissection.  The neurovascular bundles were then swept off the apex of  the prostate.  The urethra was then sharply transected and the prostate  was disarticulated.  During this dissection there was noted to be a  posterior lip on the apex of the prostate, which was carefully dissected  away from the urethra.  Particular care was taken in this area  considering that this was where the patient's disease was noted on  biopsy.  At this point there was noted be some bleeding from the dorsal  venous complex.  This appeared to be excessive and was not able to be  controlled with cautery.  Therefore 2-0 Vicryl figure-of-eight sutures  were used to control the dorsal venous complex resulting in excellent  hemostasis.  Attention was then turned to the urethral anastomosis.   A double-armed 2-0 Vicryl suture was used to reapproximate Denonvilliers  fascia to the posterior bladder neck and the urethra.  There was noted  to be some mild tension on the bladder neck, and the bladder neck and  urethra could not be brought all the way together.  A double-armed 3-0  Monocryl suture was then used to  perform a 360-degree running tension-  free anastomosis between the bladder neck to complete the anastomosis.  This was performed in a running tension-free manner.  A new 20 [place]  coude catheter was inserted into the bladder and irrigated.  Initially  there was noted to be a slight amount of leakage when testing the  anastomosis indicating that it might not be watertight.  With a slight  amount of traction on the catheter, however, there was no longer any  further leak from the anastomosis and it was felt that this anastomosis  would be acceptable.  A #19 Blake drain was then brought through the  left robotic port  and appropriately positioned in the pelvis.  It was  secured to skin with a nylon suture.   The surgical cart was then undocked.  The right lateral 12-mm port site  was closed with a 0-Vicryl suture.  All remaining ports were removed  under direct vision and the prostate specimen was removed intact within  the Endopouch retrieval bag via the periumbilical port site.  This  fascial opening was then closed with running 0-Vicryl sutures.  All port  sites were injected with one-quarter percent Marcaine and reapproximated  at the skin level with staples.  Sterile dressings were applied.   The patient did tolerate the procedure well and without complications.  He was able to be extubated and transferred to the recovery unit in  satisfactory condition.      Heloise Purpura, MD  Electronically Signed     LB/MEDQ  D:  05/12/2008  T:  05/12/2008  Job:  [id number]

## 2010-08-10 NOTE — Assessment & Plan Note (Signed)
[facility]                         GASTROENTEROLOGY OFFICE NOTE   MAHESH, SIZEMORE                     MRN:          [id number]  DATE:06/04/2006                            DOB:          02-02-1956    CHIEF COMPLAINT:  Follow up of reflux disease and Barrett's esophagus.   Mr. Wiegman had an upper endoscopy on 05/06/06. He had a distal  esophageal stricture dilated to 51 [place]. He has no more dysphagia. He  had a 1 cm area of possible Barrett's esophagus, there was intestinal  metaplasia on the biopsies. There were 2 areas not larger than a cm in  size. I have explained to him that this could be intestinal metaplasia,  the cardia versus the Barrett's esophagus. There is an increased risk of  cancer, though overall very low to him. He has no heartburn  symptomatology on his Prevacid 30 mg daily at this time.   PHYSICAL EXAMINATION:  VITAL SIGNS: Blood pressure 110/62, pulse 64.   MEDICATIONS:  Reviewed and listed on the chart. He is on Verapamil,  Lipitor, and Prevacid.   ASSESSMENT:  Short segment Barrett's esophagus is suspected in the  setting of gastroesophageal reflux disease and a successfully dilated  esophageal stricture.   PLAN:  1. Continue Prevacid to control symptoms.  2. Repeat EGD in 3 years to reassess and provide surveillance of the      short segment of Barrett's esophagus that is suspected.  3. After he is 46 (in September of this year), he should come back for      a screening colonoscopy and this is recommended to the patient.     Iva Boop, MD,FACG  Electronically Signed    CEG/MedQ  DD: 06/04/2006  DT: 06/06/2006  Job #: [id number]   cc:   Titus Dubin. Alwyn Ren, MD,FACP,FCCP

## 2010-08-10 NOTE — Assessment & Plan Note (Signed)
[facility]                         GASTROENTEROLOGY OFFICE NOTE   Eric Dillon, Eric Dillon                     MRN:          [id number]  DATE:05/01/2006                            DOB:          1955-06-11    REFERRING PHYSICIAN:  Titus Dubin. Alwyn Ren, MD,FACP,FCCP   CHIEF COMPLAINT:  Dysphagia.   ASSESSMENT:  A 55 year old white male with a known history of an  esophageal ring on barium swallow.  He is having worsening dysphagia.  The dysphagia is to solid foods.   PLAN:  Schedule upper GI endoscopy with esophageal dilation.  Risks,  benefits, and indications are reviewed.  He understands and agrees to  proceed.   HISTORY:  This is a 55 year old white man that was found to have a ring  on his barium esophagram a couple of years ago.  He was started on  Prevacid daily.  He was told he might need a dilation if it gets  worse.  Subsequently transferred his care to Dr. Alwyn Ren.  He is now  having dysphagia two or three times a week.  Last week he had an episode  where he was eating salad and the food would not go up or go down and he  had to regurgitate.  Typical foods that bother him are beef, dry  chicken, bread.  There has been no weight loss, bleeding or heartburn  symptomatology.  His Prevacid was increased from once a day to twice a  day.   PAST MEDICAL HISTORY:  Hypertension, dyslipidemia, previous stroke,  hypothyroidism, esophageal stricture/ring.   MEDICATIONS:  1. Prevacid 30 mg twice daily.  2. Lipitor 40 mg daily.  3. Verapamil 240 mg daily.  4. Antioxidant vitamins.  5. Glucosamine and chondroitin 1500/1200 mg daily.  6. Generic allergy pill as needed.  7. Fluticasone sprays p.r.n. at night for snoring.  8. Aspirin daily.  Dr. Alwyn Ren recently told him to stop.   ALLERGIES:  SULFA, AMOXICILLIN, AND CODEINE.   FAMILY HISTORY:  Mother had diabetes.  No GI problems.   SOCIAL HISTORY:  He is married.  Two sons.  He is a Interior and spatial designer  for [employer].  Social alcohol.  No tobacco or drugs.   REVIEW OF SYSTEMS:  See medical history form.   PHYSICAL EXAMINATION:  GENERAL:  Well-developed overweight white man in  no acute distress.  VITAL SIGNS:  Height 5 feet 8 inches, weight 215 pounds.  Blood pressure  140/88, pulse 60.  EYES:  Anicteric.  ENT:  Normal mouth and pharynx.  NECK:  Supple.  No thyromegaly of mass.  CHEST:  Clear.  HEART:  S1 and S2.  I hear no rubs, murmurs, or gallops.  ABDOMEN:  Obese, soft, nontender without organomegaly or mass.  EXTREMITIES:  No peripheral edema.  SKIN:  Warm and dry.  No acute rash.  NEURO/PSYCH:  Alert and oriented x3.  LYMPH NODES:  I detect no neck or supraclavicular nodes.   I appreciate the opportunity to care for this patient.  I have reviewed  the office notes sent from Dr. Alwyn Ren.     Eric Dillon.  Leone Payor, MD,FACG  Electronically Signed    CEG/MedQ  DD: 05/01/2006  DT: 05/01/2006  Job #: [id number]   cc:   Titus Dubin. Alwyn Ren, MD,FACP,FCCP

## 2010-08-10 NOTE — Assessment & Plan Note (Signed)
[facility]                        [place] OFFICE NOTE   VLADISLAV, AXELSON                     MRN:          [id number]  DATE:04/18/2006                            DOB:          05/14/1955    Eric Dillon was seen April 18, 2006 to evaluate dysphagia.  He now  has this 2-3 times per week.  He had an acute episode on January 22  after eating soup and salad for lunch.   Apparently, esophageal stricture was diagnosed in 2005 based on a barium  swallow.  He has not had an endoscopy.   He has had a dissection of the carotid artery and is on a full aspirin  daily.  He states that he drinks a few beers several times a week, and 2  or 3 cups of coffee a day.   He has a multinodular goiter, which has been evaluated with biopsy which  revealed no atypia.   He is on Lipitor for dyslipidemia, Prevacid 30 mg daily, verapamil 240  ER, glucosamine and benazepril.   His weight is stable and actually up approximately a pound.  There was  no erythema in the oropharynx.  He had no abdominal tenderness and no  organomegaly.   I have asked him to increase his Prevacid to twice a day.  The triggers  for reflux were discussed.   He was asked to change his aspirin to enteric-coated baby aspirin daily  because of the severe GI symptoms.   It is anticipated that endoscopy will be necessary unless there is  complete resolution of the symptoms with restriction of triggers and  increasing the PPI.     Titus Dubin. Alwyn Ren, MD,FACP,FCCP  Electronically Signed    [facility]/MedQ  DD: 04/19/2006  DT: 04/19/2006  Job #: [id number]

## 2011-01-13 ENCOUNTER — Other Ambulatory Visit: Payer: Self-pay | Admitting: Internal Medicine

## 2011-02-07 ENCOUNTER — Encounter: Payer: Self-pay | Admitting: Internal Medicine

## 2011-02-08 ENCOUNTER — Encounter: Payer: Self-pay | Admitting: Internal Medicine

## 2011-02-08 ENCOUNTER — Ambulatory Visit (INDEPENDENT_AMBULATORY_CARE_PROVIDER_SITE_OTHER): Payer: 59 | Admitting: Internal Medicine

## 2011-02-08 VITALS — BP 110/70 | HR 59 | Temp 98.2°F | Resp 12 | Ht 68.5 in | Wt 224.0 lb

## 2011-02-08 DIAGNOSIS — R1310 Dysphagia, unspecified: Secondary | ICD-10-CM

## 2011-02-08 DIAGNOSIS — I7771 Dissection of carotid artery: Secondary | ICD-10-CM

## 2011-02-08 DIAGNOSIS — E042 Nontoxic multinodular goiter: Secondary | ICD-10-CM

## 2011-02-08 DIAGNOSIS — Z0000 Encounter for general adult medical examination without abnormal findings: Secondary | ICD-10-CM

## 2011-02-08 LAB — BASIC METABOLIC PANEL
Calcium: 9.4 mg/dL (ref 8.4–10.5)
GFR: 80.83 mL/min (ref >60.00)
Sodium: 140 mEq/L (ref 135–145)

## 2011-02-08 LAB — HEPATIC FUNCTION PANEL
ALT: 46 U/L (ref 0–53)
AST: 26 U/L (ref 0–37)
Albumin: 4.1 g/dL (ref 3.5–5.2)
Alkaline Phosphatase: 64 U/L (ref 39–117)
Total Protein: 7.3 g/dL (ref 6.0–8.3)

## 2011-02-08 LAB — CBC WITH DIFFERENTIAL/PLATELET
Basophils Absolute: 0.1 10*3/uL (ref 0.0–0.1)
Basophils Relative: 0.7 % (ref 0.0–3.0)
Hemoglobin: 15.8 g/dL (ref 13.0–17.0)
Lymphocytes Relative: 24.7 % (ref 12.0–46.0)
Monocytes Relative: 7.4 % (ref 3.0–12.0)
Neutro Abs: 5.6 10*3/uL (ref 1.4–7.7)
RBC: 4.64 Mil/uL (ref 4.22–5.81)
WBC: 8.6 10*3/uL (ref 4.5–10.5)

## 2011-02-08 LAB — LIPID PANEL
HDL: 63.2 mg/dL (ref >39.00)
Total CHOL/HDL Ratio: 3
VLDL: 20.8 mg/dL (ref 0.0–40.0)

## 2011-02-08 NOTE — Patient Instructions (Addendum)
Preventive Health Care: Exercise at least 30-45 minutes a day,  3-4 days a week.  Eat a low-fat diet with lots of fruits and vegetables, up to 7-9 servings per day. Avoid obesity; your goal is waist measurement < 40 inches.Consume less than 40 grams of sugar per day from foods & drinks with High Fructose Corn Sugar as # 1,2,3 or # 4 on label. Alcohol If you drink, do it moderately,less than 9 drinks per week, preferably less than 6 @ most.  Use T-Gel shampoo for psoriasis ; Cort aid applied  2 times a day as needed

## 2011-02-08 NOTE — Progress Notes (Signed)
Subjective:    Patient ID: Eric Dillon, male    DOB: [birth year], 55 y.o.   MRN: [id number]  HPI  Eric Dillon  is here for a physical;acute issues are noted below.      Review of Systems Patient reports no  vision/ hearing changes,anorexia, weight change, fever ,adenopathy, persistant / recurrent hoarseness,  chest pain,palpitations, edema,persistant / recurrent cough, hemoptysis, dyspnea(rest, exertional, paroxysmal nocturnal), gastrointestinal  bleeding (melena, rectal bleeding), abdominal pain,  GU symptoms( dysuria, hematuria, pyuria, voiding/incontinence  issues) syncope, focal weakness, memory loss, skin/hair/nail changes,depression, anxiety, abnormal bruising/bleeding (some bruising  with Plavix),or  musculoskeletal symptoms/signs.  He is inquiring about coming off the Plavix; he believes he was told by the vascular surgeon that he should be on this for one year only.  He has had some psoriasis of the scalp.  He has some dyspepsia and intermittent food dysphagia.  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     Objective:   Physical Exam Gen.: Healthy and well-nourished in appearance. Alert, appropriate and cooperative throughout exam. Head: Normocephalic without obvious abnormalities.  Moustache; pattern alopecia  Eyes: No corneal or conjunctival inflammation noted. Pupils asymmetric; OD > OS.  Fundal exam is benign without hemorrhages, exudate, papilledema. Extraocular motion intact. Vision grossly normal with lenses. Ears: External  ear exam reveals no significant lesions or deformities. Canals clear .TMs normal. Hearing is grossly normal bilaterally. Nose: External nasal exam reveals no deformity or inflammation. Nasal mucosa are pink and moist. No lesions or exudates noted.   Mouth: Oral mucosa and oropharynx reveal no lesions or exudates. Teeth in good repair. Neck: No deformities, masses, or tenderness noted. Range of motion  normal. Thyroid : A large goiter appears to be present on the right without definite nodularity. Lungs: Normal respiratory effort; chest expands symmetrically. Lungs are clear to auscultation without rales, wheezes, or increased work of breathing. Heart: Normal rate and rhythm. Normal S1 and S2. No gallop, click, or rub. No murmur. Abdomen: Bowel sounds normal; abdomen soft and nontender. No masses, organomegaly or hernias noted. Genitalia/ DRE: Dr Crecencio Mc   .                                                                                   Musculoskeletal/extremities: No deformity or scoliosis noted of  the thoracic or lumbar spine. No clubbing, cyanosis, edema, or deformity noted. Range of motion  normal .Tone & strength  normal.Joints normal. Nail health  good. Vascular: Carotid, radial artery, dorsalis pedis and  posterior tibial pulses are full and equal. No bruits present. Neurologic: Alert and oriented x3. Deep tendon reflexes symmetrical and normal.          Skin: Intact without suspicious lesions or rashes. Lymph: No cervical, axillary  lymphadenopathy present. Psych: Mood and affect are normal. Normally interactive  Assessment & Plan:  #1 comprehensive physical exam; no acute findings #2 see Problem List with Assessments & Recommendations  #3 psoriasis by history; no significant changes on physical noted  #4 intermittent food dysphagia; GI evaluation is needed to rule out stricture  #5 Plavix therapy; reassessment by vascular surgeon will be pursued  #6 goiter; ultrasound will be performed Plan: see Orders

## 2011-02-10 ENCOUNTER — Other Ambulatory Visit: Payer: Self-pay | Admitting: Internal Medicine

## 2011-02-11 ENCOUNTER — Ambulatory Visit
Admission: RE | Admit: 2011-02-11 | Discharge: 2011-02-11 | Disposition: A | Discharge status: Home / Self Care | Payer: 59 | Source: Ambulatory Visit | Attending: Internal Medicine | Admitting: Internal Medicine

## 2011-02-11 DIAGNOSIS — E042 Nontoxic multinodular goiter: Secondary | ICD-10-CM

## 2011-02-12 ENCOUNTER — Telehealth: Payer: Self-pay | Admitting: Internal Medicine

## 2011-02-12 NOTE — Telephone Encounter (Signed)
As per Dr. Candie Chroman phone call; Plavix  can be discontinued after 12 months total as long as  no new symptoms occur.Cancel F/u appt with Dr Hart Rochester

## 2011-02-12 NOTE — Telephone Encounter (Signed)
In reference to Referral back to Dr. Lawson/[facility] to discuss discontinuance of Plavix, I received call from [facility] in Dr. Candie Chroman office, who states per Dr. Candie Chroman last note from 05/2010, patient is to continue Plavix for 12 months, and only follow-up with him PRN.  Kendal Hymen is calling stating unless something new is going on, that he doesn't need to see Dr. Hart Rochester.  Dr. Alwyn Ren, please advise.

## 2011-02-13 NOTE — Telephone Encounter (Signed)
Discuss with patient  

## 2011-02-13 NOTE — Telephone Encounter (Signed)
Left message to call office

## 2011-02-18 ENCOUNTER — Other Ambulatory Visit: Payer: Self-pay | Admitting: Internal Medicine

## 2011-02-18 ENCOUNTER — Ambulatory Visit (INDEPENDENT_AMBULATORY_CARE_PROVIDER_SITE_OTHER): Payer: 59 | Admitting: Internal Medicine

## 2011-02-18 ENCOUNTER — Encounter: Payer: Self-pay | Admitting: Internal Medicine

## 2011-02-18 DIAGNOSIS — R1310 Dysphagia, unspecified: Secondary | ICD-10-CM

## 2011-02-18 DIAGNOSIS — K222 Esophageal obstruction: Secondary | ICD-10-CM

## 2011-02-18 DIAGNOSIS — K219 Gastro-esophageal reflux disease without esophagitis: Secondary | ICD-10-CM

## 2011-02-18 DIAGNOSIS — K2270 Barrett's esophagus without dysplasia: Secondary | ICD-10-CM

## 2011-02-18 DIAGNOSIS — R1314 Dysphagia, pharyngoesophageal phase: Secondary | ICD-10-CM

## 2011-02-18 MED ORDER — NEXIUM 40 MG PO CPDR: 40.0000 mg | DELAYED_RELEASE_CAPSULE | Freq: Every day | ORAL | Status: DC

## 2011-02-18 MED ORDER — RANITIDINE HCL 300 MG PO TABS: 300.0000 mg | ORAL_TABLET | Freq: Two times a day (BID) | ORAL | Status: DC

## 2011-02-18 NOTE — Patient Instructions (Addendum)
Begin the Nexium when you discontinue the Plavix. Your prescription has been sent to your mail order pharmacy, [facility]. Please start Ranitidine 300 mg take 1 by mouth twice a day, you can get this over the counter. If your swallowing problem does not resolve in 3 months please call us back. Recall office visit is set for 3 months you will be contacted around that time.

## 2011-02-18 NOTE — Progress Notes (Signed)
Subjective:    Patient ID: Eric Dillon, male    DOB: 05/19/1955, 55 y.o.   MRN: [id number]  HPI This man presents with recurrent dysphagia. He is known to me with GERD and Barrett's esophagus and esophageal stricture. He has responded to dilation in the past. He had been on Nexium 40 mg daily without heartburn or dysphagia. 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 He has been on Plavix for one year, because of a possible interaction between Nexium and Plavix he has been on ranitidine 150 mg twice daily since then. In the last 6 weeks he said 2 episodes of solid dysphagia to meat. It was transient. There are some mild heartburn symptoms. Outpatient Prescriptions Prior to Visit  Medication Sig Dispense Refill  . aspirin 325 MG buffered tablet Take 325 mg by mouth daily. TAKE WITH FOOD       . atorvastatin (LIPITOR) 40 MG tablet TAKE 1 TABLET DAILY  90 tablet  2  . ranitidine (ZANTAC) 150 MG tablet TAKE 1 TABLET BY MOUTH TWICE A DAY BEFORE MEALS REPLACES NEXIUM  60 tablet  5  . verapamil (CALAN-SR) 240 MG CR tablet TAKE 1 TABLET DAILY  90 tablet  2  . clopidogrel (PLAVIX) 75 MG tablet TAKE 1 TABLET BY MOUTH DAILY  30 tablet  5   Past Medical History  Diagnosis Date  . Hyperlipidemia   . Hypertension   . GERD (gastroesophageal reflux disease)   . Cancer     PROSTATE  . Goiter   . Barrett's esophagus 2008  . Diverticulosis   . Hemorrhoids   . Adenomatous colon polyp   . Stroke 1996    FROM DISSECTING R CAROID ARTERY   . Carotid artery dissection 2011    L   Past Surgical History  Procedure Date  . Dissection of caroid artery     WITH CVA  . Knee surgery     RIGHT..TO STRIGHTEN PATELLA  . Robotic prostatectomy   . Carotid artery angioplasty 2011    L  . Biopsy thyroid 2007    colloid  admixed with scant follicular epithelium  . Colonoscopy w/ biopsies and polypectomy 01/14/2008    adenomatous polyps, internal hemorrhoids,  diverticulosis  . Upper gastrointestinal endoscopy 4/18/12011    w/dilation, Barrett's , esophageal ring, hiatal hernia, gastritis    reports that he quit smoking about 12 months ago. He does not have any smokeless tobacco history on file. He reports that he drinks about 8.4 ounces of alcohol per week. He reports that he does not use illicit drugs. family history includes Diabetes in his mother; Heart disease in his father; Stroke in his paternal grandmother; and Stroke (age of onset:72) in his father. Allergies  Allergen Reactions  . Amoxicillin     REACTION: hives  . Codeine     REACTION: rash  . Sulfonamide Derivatives     REACTION: angioedema  . Erythromycin     REACTION: ? reaction         Review of Systems As above    Objective:   Physical Exam Well-developed well-nourished no acute distress      Assessment & Plan:  This man has GERD with stricture and Barrett's esophagus. He is having dysphagia problems. It has been rare infrequent overall. I believe this is related to not using a PPI, which was stopped since he required Plavix for a year, after a carotid artery dissection and angioplasty. He could  have used pantoprazole which appears to be safe with Plavix. At this point he is about to stop his Plavix in 3 weeks. So will resume his Nexium at that time and try to take 300 mg ranitidine twice a day in the interim, chewing food well. I will ask him to call back in 2 months to give me an update regarding her swallowing problems. If they fail to resolve an endoscopy could be needed. Will also place a recall for 3 month office visit for chart review at that time.

## 2011-02-18 NOTE — Assessment & Plan Note (Signed)
Due for repeat screening and surveillance EGD 2014. I would not change that at this time.

## 2011-04-17 ENCOUNTER — Encounter: Payer: Self-pay | Admitting: Internal Medicine

## 2011-04-17 ENCOUNTER — Other Ambulatory Visit: Payer: Self-pay | Admitting: Internal Medicine

## 2012-02-25 ENCOUNTER — Encounter: Payer: Self-pay | Admitting: Family

## 2012-02-25 ENCOUNTER — Ambulatory Visit (INDEPENDENT_AMBULATORY_CARE_PROVIDER_SITE_OTHER): Payer: 59 | Admitting: Family

## 2012-02-25 VITALS — BP 134/82 | HR 76 | Temp 98.2°F | Resp 18 | Wt 229.1 lb

## 2012-02-25 DIAGNOSIS — H109 Unspecified conjunctivitis: Secondary | ICD-10-CM

## 2012-02-25 MED ORDER — CIPROFLOXACIN HCL 0.3 % OP SOLN: 1.0000 [drp] | OPHTHALMIC | Status: DC

## 2012-02-25 NOTE — Progress Notes (Signed)
Subjective:    Patient ID: Eric Dillon, male    DOB: Jan 14, 1956, 56 y.o.   MRN: [id number]  HPI  Eric Dillon is a 56 yr old male who presents today with chief complaint of irritation and discharge of the left eye. Symptoms have been present x 3 days.  He reports that for the last 3 mornings his left eyelid has been stuck shut when he woke up. He reports associated mild blurring of his vision in the left eye as well.  Denies associated nasal congestion/cough or contact with anyone with pink eye.   Review of Systems See HPI  Past Medical History  Diagnosis Date  . Hyperlipidemia   . Hypertension   . GERD (gastroesophageal reflux disease)   . Cancer     PROSTATE  . Goiter   . Barrett's esophagus 2008  . Diverticulosis   . Hemorrhoids   . Adenomatous colon polyp   . Stroke 1996    FROM DISSECTING R CAROID ARTERY   . Carotid artery dissection 2011    L    History   Social History  . Marital Status: Married    Spouse Name: N/A    Number of Children: N/A  . Years of Education: N/A   Occupational History  . Not on file.   Social History Main Topics  . Smoking status: Former Smoker    Quit date: 01/23/2010  . Smokeless tobacco: Not on file  . Alcohol Use: 8.4 oz/week    14 Cans of beer per week  . Drug Use: No  . Sexually Active: Not on file   Other Topics Concern  . Not on file   Social History Narrative   Gets reg exercise    Past Surgical History  Procedure Date  . Dissection of caroid artery     WITH CVA  . Knee surgery     RIGHT..TO STRIGHTEN PATELLA  . Robotic prostatectomy   . Carotid artery angioplasty 2011    L  . Biopsy thyroid 2007    colloid  admixed with scant follicular epithelium  . Colonoscopy w/ biopsies and polypectomy 01/14/2008    adenomatous polyps, internal hemorrhoids, diverticulosis  . Upper gastrointestinal endoscopy 4/18/12011    w/dilation, Barrett's , esophageal ring, hiatal hernia, gastritis    Family History  Problem  Relation Age of Onset  . Diabetes Mother   . Stroke Father 69    CVA,BLOOD CLOT TO BRAIN  . Heart disease Father     BYPASS  . Stroke Paternal Grandmother     MULTIPLE CVA'S    Allergies  Allergen Reactions  . Amoxicillin     REACTION: hives  . Codeine     REACTION: rash  . Sulfonamide Derivatives     REACTION: angioedema  . Erythromycin     REACTION: ? reaction    Current Outpatient Prescriptions on File Prior to Visit  Medication Sig Dispense Refill  . aspirin 325 MG buffered tablet Take 325 mg by mouth daily. TAKE WITH FOOD       . atorvastatin (LIPITOR) 40 MG tablet TAKE 1 TABLET DAILY  90 tablet  3  . verapamil (CALAN-SR) 240 MG CR tablet TAKE 1 TABLET DAILY  90 tablet  3  . [DISCONTINUED] NEXIUM 40 MG capsule Take 1 capsule (40 mg total) by mouth daily before breakfast. Start when Plavix discontinued  90 capsule  3  . clopidogrel (PLAVIX) 75 MG tablet TAKE 1 TABLET BY MOUTH DAILY  30 tablet  5  .  ranitidine (ZANTAC) 300 MG tablet Take 1 tablet (300 mg total) by mouth 2 (two) times daily.  60 tablet  0    BP 134/82  Pulse 76  Temp 98.2 F (36.8 C) (Oral)  Resp 18  Wt 229 lb 1.3 oz (103.91 kg)  SpO2 99%       Objective:   Physical Exam  Constitutional: He appears well-developed and well-nourished. No distress.  HENT:  Head: Normocephalic and atraumatic.  Right Ear: Tympanic membrane and ear canal normal.  Left Ear: Tympanic membrane and ear canal normal.  Mouth/Throat: No oropharyngeal exudate, posterior oropharyngeal edema or posterior oropharyngeal erythema.  Eyes: Right conjunctiva is injected. Left conjunctiva is injected. Pupils are unequal.       Left pupil is noted to be smaller than the right pupil  Cardiovascular: Normal rate and regular rhythm.   No murmur heard. Pulmonary/Chest: Effort normal and breath sounds normal. No respiratory distress. He has no wheezes. He has no rales. He exhibits no tenderness.  Musculoskeletal: He exhibits no edema.   Psychiatric: He has a normal mood and affect. His behavior is normal. Judgment and thought content normal.          Assessment & Plan:  Conjunctivitis- Will rx with ciloxan (not multiple drug allergies).  Plan to treat both eyes. Pt instructed to contact us if symptoms worsen or if no improvement in 2-3 days.

## 2012-02-25 NOTE — Patient Instructions (Addendum)
Conjunctivitis Conjunctivitis is commonly called "pink eye." Conjunctivitis can be caused by bacterial or viral infection, allergies, or injuries. There is usually redness of the lining of the eye, itching, discomfort, and sometimes discharge. There may be deposits of matter along the eyelids. A viral infection usually causes a watery discharge, while a bacterial infection causes a yellowish, thick discharge. Pink eye is very contagious and spreads by direct contact. You may be given antibiotic eyedrops as part of your treatment. Before using your eye medicine, remove all drainage from the eye by washing gently with warm water and cotton balls. Continue to use the medication until you have awakened 2 mornings in a row without discharge from the eye. Do not rub your eye. This increases the irritation and helps spread infection. Use separate towels from other household members. Wash your hands with soap and water before and after touching your eyes. Use cold compresses to reduce pain and sunglasses to relieve irritation from light. Do not wear contact lenses or wear eye makeup until the infection is gone. SEEK MEDICAL CARE IF:   Your symptoms are not better after 3 days of treatment.  You have increased pain or trouble seeing.  The outer eyelids become very red or swollen. Document Released: 04/18/2004 Document Revised: 06/03/2011 Document Reviewed: 03/11/2005 Exit[employer] Patient Information 2013 [employer].  

## 2012-03-02 ENCOUNTER — Telehealth: Payer: Self-pay | Admitting: *Deleted

## 2012-03-02 DIAGNOSIS — H538 Other visual disturbances: Secondary | ICD-10-CM

## 2012-03-02 NOTE — Telephone Encounter (Signed)
Received message from pt stating pink eye seems to be a little better but he continues to have blurred vision. Wants to know if he should follow up with [place] or scheduled appt with eye doctor?  Please advise.

## 2012-03-02 NOTE — Telephone Encounter (Signed)
I would like him to see opthalmology in the next few days.  I have placed referral- Myriam Jacobson should be contacting him about his appointment.

## 2012-03-02 NOTE — Telephone Encounter (Signed)
Notified pt. 

## 2012-04-01 ENCOUNTER — Ambulatory Visit (INDEPENDENT_AMBULATORY_CARE_PROVIDER_SITE_OTHER): Payer: 59 | Admitting: Internal Medicine

## 2012-04-01 ENCOUNTER — Encounter: Payer: Self-pay | Admitting: Internal Medicine

## 2012-04-01 VITALS — BP 108/72 | HR 68 | Temp 97.6°F | Ht 67.08 in | Wt 232.8 lb

## 2012-04-01 DIAGNOSIS — E785 Hyperlipidemia, unspecified: Secondary | ICD-10-CM

## 2012-04-01 DIAGNOSIS — I10 Essential (primary) hypertension: Secondary | ICD-10-CM

## 2012-04-01 DIAGNOSIS — Z23 Encounter for immunization: Secondary | ICD-10-CM

## 2012-04-01 DIAGNOSIS — Z0000 Encounter for general adult medical examination without abnormal findings: Secondary | ICD-10-CM

## 2012-04-01 DIAGNOSIS — E042 Nontoxic multinodular goiter: Secondary | ICD-10-CM

## 2012-04-01 NOTE — Patient Instructions (Addendum)
Preventive Health Care: Exercise at least 30-45 minutes a day,  3-4 days a week.  Eat a low-fat diet with lots of fruits and vegetables, up to 7-9 servings per day.  Consume less than 40 grams of sugar per day from foods & drinks with High Fructose Corn Sugar as #1,2,3 or # 4 on label. Blood Pressure Goal  Ideally is an AVERAGE < 130/80. This AVERAGE should be calculated from @ least 5-7 BP readings taken @ different times of day on different days of week. You should not respond to isolated BP readings , but rather the AVERAGE for that week  Please  schedule fasting Labs : BMET,Lipids, hepatic panel, CBC & dif,free T3, free T4, TSH.PLEASE BRING THESE INSTRUCTIONS TO FOLLOW UP  LAB APPOINTMENT.This will guarantee correct labs are drawn, eliminating need for repeat blood sampling ( needle sticks ! ). Diagnoses /Codes: V70.0.  If you activate My Chart; the results can be released to you as soon as they populate from the lab. If you choose not to use this program; the labs have to be reviewed, copied & mailed   causing a delay in getting the results to you.

## 2012-04-01 NOTE — Progress Notes (Signed)
  Subjective:    Patient ID: Eric Dillon, male    DOB: [birth year], 57 y.o.   MRN: [id number]  HPI  Mr Eric Dillon is here for a physical;acute issues include viral iritis for which he is on antivirals & steroids.      Review of Systems HYPERTENSION: Disease Monitoring: Blood pressure range-130s/78-low 80s  Chest pain, palpitations- no       Dyspnea- no Medications: Compliance- yes  Lightheadedness,Syncope- no   Edema- no  HYPERLIPIDEMIA: Disease Monitoring: See symptoms for Hypertension Medications: Compliance- yes  Abd pain, bowel changes- no Muscle aches- no          Objective:   Physical Exam Gen.:  well-nourished in appearance. Alert, appropriate and cooperative throughout exam. Head: Normocephalic without obvious abnormalities;  pattern alopecia  Eyes: No corneal or conjunctival inflammation noted. Pupils asymmetric.Slight ptosis OS. Extraocular motion intact. Vision grossly decreased even with lenses. Ears: External  ear exam reveals no significant lesions or deformities. Canals clear .TMs normal. Hearing is grossly normal bilaterally. Nose: External nasal exam reveals no deformity or inflammation. Nasal mucosa are pink and moist. No lesions or exudates noted.   Mouth: Oral mucosa and oropharynx reveal no lesions or exudates. Teeth in good repair. Neck: No deformities, masses, or tenderness noted. Range of motion normal. Thyroid: ? Goiter on R Lungs: Normal respiratory effort; chest expands symmetrically. Lungs are clear to auscultation without rales, wheezes, or increased work of breathing. Heart: Normal rate and rhythm. Normal S1 and S2. No gallop, click, or rub. No murmur. Abdomen: Bowel sounds normal; abdomen soft and nontender. No masses, organomegaly or hernias noted. Genitalia: Dr Eric Dillon                                                        Musculoskeletal/extremities: No deformity or scoliosis noted of  the thoracic or lumbar spine. No clubbing, cyanosis,  edema, or deformity noted. Range of motion  normal .Tone & strength  normal.Joints normal. Nail health  good. Vascular: Carotid, radial artery, dorsalis pedis and  posterior tibial pulses are full and equal. No bruits present. Neurologic: Alert and oriented x3. Deep tendon reflexes symmetrical and normal.         Skin: Intact without suspicious lesions or rashes. Lymph: No cervical, axillary lymphadenopathy present. Psych: Mood and affect are normal. Normally interactive                                                                                        Assessment & Plan:  #1 comprehensive physical exam; no acute findings #2 viral iritis Plan: see Orders

## 2012-04-02 ENCOUNTER — Other Ambulatory Visit (INDEPENDENT_AMBULATORY_CARE_PROVIDER_SITE_OTHER): Payer: 59

## 2012-04-02 DIAGNOSIS — Z0000 Encounter for general adult medical examination without abnormal findings: Secondary | ICD-10-CM

## 2012-04-02 LAB — CBC WITH DIFFERENTIAL/PLATELET
Basophils Absolute: 0.1 10*3/uL (ref 0.0–0.1)
Eosinophils Relative: 2.8 % (ref 0.0–5.0)
HCT: 45.9 % (ref 39.0–52.0)
Hemoglobin: 15.3 g/dL (ref 13.0–17.0)
Lymphs Abs: 2.1 10*3/uL (ref 0.7–4.0)
MCV: 98.4 fl (ref 78.0–100.0)
Monocytes Absolute: 0.8 10*3/uL (ref 0.1–1.0)
Neutro Abs: 6.1 10*3/uL (ref 1.4–7.7)
Platelets: 309 10*3/uL (ref 150.0–400.0)
RDW: 13.1 % (ref 11.5–14.6)

## 2012-04-02 LAB — HEPATIC FUNCTION PANEL
AST: 25 U/L (ref 0–37)
Alkaline Phosphatase: 67 U/L (ref 39–117)
Bilirubin, Direct: 0 mg/dL (ref 0.0–0.3)
Total Bilirubin: 0.8 mg/dL (ref 0.3–1.2)

## 2012-04-02 LAB — BASIC METABOLIC PANEL
BUN: 15 mg/dL (ref 6–23)
CO2: 27 mEq/L (ref 19–32)
Calcium: 9.6 mg/dL (ref 8.4–10.5)
Creatinine, Ser: 1 mg/dL (ref 0.4–1.5)
Glucose, Bld: 102 mg/dL — ABNORMAL HIGH (ref 70–99)

## 2012-04-02 LAB — T3, FREE: T3, Free: 2.7 pg/mL (ref 2.3–4.2)

## 2012-04-02 LAB — LIPID PANEL: Total CHOL/HDL Ratio: 3

## 2012-04-03 ENCOUNTER — Encounter: Payer: Self-pay | Admitting: *Deleted

## 2012-04-06 ENCOUNTER — Ambulatory Visit
Admission: RE | Admit: 2012-04-06 | Discharge: 2012-04-06 | Disposition: A | Discharge status: Home / Self Care | Payer: 59 | Source: Ambulatory Visit | Attending: Internal Medicine | Admitting: Internal Medicine

## 2012-04-06 DIAGNOSIS — E042 Nontoxic multinodular goiter: Secondary | ICD-10-CM

## 2012-04-07 ENCOUNTER — Other Ambulatory Visit: Payer: 59

## 2012-04-07 ENCOUNTER — Encounter: Payer: Self-pay | Admitting: *Deleted

## 2012-04-08 ENCOUNTER — Other Ambulatory Visit: Payer: Self-pay | Admitting: Internal Medicine

## 2012-07-06 ENCOUNTER — Encounter: Payer: Self-pay | Admitting: Internal Medicine

## 2012-07-07 ENCOUNTER — Encounter: Payer: Self-pay | Admitting: Internal Medicine

## 2012-07-15 ENCOUNTER — Other Ambulatory Visit: Payer: Self-pay | Admitting: Internal Medicine

## 2012-07-16 ENCOUNTER — Encounter: Payer: Self-pay | Admitting: Internal Medicine

## 2012-07-16 ENCOUNTER — Telehealth: Payer: Self-pay | Admitting: Internal Medicine

## 2012-07-16 MED ORDER — ESOMEPRAZOLE MAGNESIUM 40 MG PO CPDR: 40.0000 mg | DELAYED_RELEASE_CAPSULE | Freq: Every day | ORAL | Status: DC

## 2012-07-16 NOTE — Telephone Encounter (Signed)
Confirmed with patient that he needed 90 day supply sent in to [facility].

## 2012-09-02 ENCOUNTER — Encounter: Payer: Self-pay | Admitting: Internal Medicine

## 2012-09-02 ENCOUNTER — Ambulatory Visit (AMBULATORY_SURGERY_CENTER): Payer: 59 | Admitting: *Deleted

## 2012-09-02 VITALS — Ht 68.0 in | Wt 220.6 lb

## 2012-09-02 DIAGNOSIS — Z1211 Encounter for screening for malignant neoplasm of colon: Secondary | ICD-10-CM

## 2012-09-02 DIAGNOSIS — Z86010 Personal history of colonic polyps: Secondary | ICD-10-CM

## 2012-09-02 DIAGNOSIS — K2270 Barrett's esophagus without dysplasia: Secondary | ICD-10-CM

## 2012-09-02 MED ORDER — NA SULFATE-K SULFATE-MG SULF 17.5-3.13-1.6 GM/177ML PO SOLN: 1.0000 | Freq: Once | ORAL | Status: DC

## 2012-09-02 NOTE — Progress Notes (Signed)
Denies allergies to eggs or soy products. Denies complications with sedation or anesthesia. 

## 2012-09-16 ENCOUNTER — Encounter: Payer: Self-pay | Admitting: Internal Medicine

## 2012-09-16 ENCOUNTER — Ambulatory Visit (AMBULATORY_SURGERY_CENTER): Payer: 59 | Admitting: Internal Medicine

## 2012-09-16 VITALS — BP 111/63 | HR 44 | Temp 97.5°F | Resp 19 | Ht 68.0 in | Wt 220.0 lb

## 2012-09-16 DIAGNOSIS — D126 Benign neoplasm of colon, unspecified: Secondary | ICD-10-CM

## 2012-09-16 DIAGNOSIS — K5730 Diverticulosis of large intestine without perforation or abscess without bleeding: Secondary | ICD-10-CM

## 2012-09-16 DIAGNOSIS — K2270 Barrett's esophagus without dysplasia: Secondary | ICD-10-CM

## 2012-09-16 DIAGNOSIS — K219 Gastro-esophageal reflux disease without esophagitis: Secondary | ICD-10-CM

## 2012-09-16 DIAGNOSIS — Z86010 Personal history of colonic polyps: Secondary | ICD-10-CM

## 2012-09-16 MED ORDER — SODIUM CHLORIDE 0.9 % IV SOLN: 500.0000 mL | INTRAVENOUS | Status: DC

## 2012-09-16 NOTE — Op Note (Signed)
[facility] [address] [place], [address]   COLONOSCOPY PROCEDURE REPORT  PATIENT: Eric, Dillon  MR#: [id number] BIRTHDATE: 06-Aug-1955 , 56  yrs. old GENDER: Male ENDOSCOPIST: Iva Boop, MD, [facility] PROCEDURE DATE:  09/16/2012 PROCEDURE:   Colonoscopy with biopsy and snare polypectomy ASA CLASS:   Class III INDICATIONS:Patient's personal history of adenomatous colon polyps.  MEDICATIONS: There was residual sedation effect present from prior procedure, propofol (Diprivan) 250mg  IV, MAC sedation, administered by CRNA, and These medications were titrated to patient response per physician's verbal order  DESCRIPTION OF PROCEDURE:   After the risks benefits and alternatives of the procedure were thoroughly explained, informed consent was obtained.  A digital rectal exam revealed a surgically absent prostate.   The LB [id number]  endoscope was introduced through the anus and advanced to the cecum, which was identified by both the appendix and ileocecal valve. No adverse events experienced.   The quality of the prep was Suprep good  The instrument was then slowly withdrawn as the colon was fully examined.      COLON FINDINGS: Two sessile polyps measuring 2 and 5 mm in size were found in the distal transverse colon.  A polypectomy was performed with cold forceps (2mm) and with a cold snare (5mm).  The resection was complete and the polyp tissue was completely retrieved. Moderate diverticulosis was noted in the sigmoid colon.   The colon mucosa was otherwise normal.   A right colon retroflexion was performed.  Retroflexed views revealed no abnormalities. The time to cecum=1 minutes 36 seconds.  Withdrawal time=9 minutes 53 seconds.  The scope was withdrawn and the procedure completed. COMPLICATIONS: There were no complications.  ENDOSCOPIC IMPRESSION: 1.   Two sessile polyps measuring 2 and 5 mm in size were found in the distal transverse colon;  polypectomy was performed with cold forceps and with a cold snare 2.   Moderate diverticulosis was noted in the sigmoid colon 3.   The colon mucosa was otherwise normal - good prep  RECOMMENDATIONS: 1.  Timing of repeat colonoscopy will be determined by pathology findings in patient with 2 diminutive adenomas 2009 2.   Consider repeating a sleep apnea evaluation (snoring and some desaturation with sedation today).   eSigned:  Iva Boop, MD, [facility] 09/16/2012 10:01 AM   cc: The Patient

## 2012-09-16 NOTE — Patient Instructions (Addendum)
YOU HAD AN ENDOSCOPIC PROCEDURE TODAY AT THE [facility]: Refer to the procedure report that was given to you for any specific questions about what was found during the examination.  If the procedure report does not answer your questions, please call your gastroenterologist to clarify.  If you requested that your care partner not be given the details of your procedure findings, then the procedure report has been included in a sealed envelope for you to review at your convenience later.  YOU SHOULD EXPECT: Some feelings of bloating in the abdomen. Passage of more gas than usual.  Walking can help get rid of the air that was put into your GI tract during the procedure and reduce the bloating. If you had a lower endoscopy (such as a colonoscopy or flexible sigmoidoscopy) you may notice spotting of blood in your stool or on the toilet paper. If you underwent a bowel prep for your procedure, then you may not have a normal bowel movement for a few days.  DIET: Your first meal following the procedure should be a light meal and then it is ok to progress to your normal diet.  A half-sandwich or bowl of soup is an example of a good first meal.  Heavy or fried foods are harder to digest and may make you feel nauseous or bloated.  Likewise meals heavy in dairy and vegetables can cause extra gas to form and this can also increase the bloating.  Drink plenty of fluids but you should avoid alcoholic beverages for 24 hours.  ACTIVITY: Your care partner should take you home directly after the procedure.  You should plan to take it easy, moving slowly for the rest of the day.  You can resume normal activity the day after the procedure however you should NOT DRIVE or use heavy machinery for 24 hours (because of the sedation medicines used during the test).    SYMPTOMS TO REPORT IMMEDIATELY: A gastroenterologist can be reached at any hour.  During normal business hours, 8:30 AM to 5:00 PM Monday through Friday,  call [phone].  After hours and on weekends, please call the GI answering service at [phone] who will take a message and have the physician on call contact you.   Following lower endoscopy (colonoscopy or flexible sigmoidoscopy):  Excessive amounts of blood in the stool  Significant tenderness or worsening of abdominal pains  Swelling of the abdomen that is new, acute  Fever of 100F or higher  Following upper endoscopy (EGD)  Vomiting of blood or coffee ground material  New chest pain or pain under the shoulder blades  Painful or persistently difficult swallowing  New shortness of breath  Fever of 100F or higher  Black, tarry-looking stools  FOLLOW UP: If any biopsies were taken you will be contacted by phone or by letter within the next 1-3 weeks.  Call your gastroenterologist if you have not heard about the biopsies in 3 weeks.  Our staff will call the home number listed on your records the next business day following your procedure to check on you and address any questions or concerns that you may have at that time regarding the information given to you following your procedure. This is a courtesy call and so if there is no answer at the home number and we have not heard from you through the emergency physician on call, we will assume that you have returned to your regular daily activities without incident.  SIGNATURES/CONFIDENTIALITY: You and/or your care   partner have signed paperwork which will be entered into your electronic medical record.  These signatures attest to the fact that that the information above on your After Visit Summary has been reviewed and is understood.  Full responsibility of the confidentiality of this discharge information lies with you and/or your care-partner.  I took biopsies of the Barrett's again and found and removed two colon polyps. Nothing suspicious. You also have diverticulosis. Not usually a problem.  I will let you know pathology  results and when to have another routine colonoscopy and endoscopy by mail.  You had some significant snoring and and we used an oral airway to help today so I recommend you consider having another sleep apnea evaluation - you can discuss with Dr. Alwyn Ren.  I appreciate the opportunity to care for you. Iva Boop, MD, [facility]

## 2012-09-16 NOTE — Progress Notes (Signed)
TALKED TO FAMILY AND PT ABOUT SLEEP STUDY, SOME OBSTRUCTION NOTED DURING PROCEDURE. ORAL AIRWAY USED . TOL WELL

## 2012-09-16 NOTE — Progress Notes (Signed)
Patient did not experience any of the following events: a burn prior to discharge; a fall within the facility; wrong site/side/patient/procedure/implant event; or a hospital transfer or hospital admission upon discharge from the facility. (G8907) Patient did not have preoperative order for IV antibiotic SSI prophylaxis. (G8918)  

## 2012-09-16 NOTE — Op Note (Signed)
[facility] [address] [place], [address]   ENDOSCOPY PROCEDURE REPORT  PATIENT: Eric Dillon, Eric Dillon  MR#: [id number] BIRTHDATE: 1955/04/28 , 56  yrs. old GENDER: Male ENDOSCOPIST: Iva Boop, MD, [facility] PROCEDURE DATE:  09/16/2012 PROCEDURE:  Esophagoscopy  and biopsy ASA CLASS:     Class III INDICATIONS:  follow up of Barrett's esophagus. MEDICATIONS: propofol (Diprivan) 250mg  IV, MAC sedation, administered by CRNA, and These medications were titrated to patient response per physician's verbal order TOPICAL ANESTHETIC: none  DESCRIPTION OF PROCEDURE: After the risks benefits and alternatives of the procedure were thoroughly explained, informed consent was obtained.  The LB [id number] endoscope was introduced through the mouth and advanced to the stomach antrum. Without limitations.  The instrument was slowly withdrawn as the mucosa was fully examined.        ESOPHAGUS: There was evidence of Barrett's esophagus at the gastroesophageal junction.  irregular Z-line at 40 cm with 3 < 5 mm tongues of columnar change. No nodules or ulcers. Multiple biopsies were performed using cold forceps.  STOMACH: The stomach mucosa appeared normal in the entire examined stomach.  Retroflexed views revealed no abnormalities.     The scope was then withdrawn from the patient and the procedure completed.  COMPLICATIONS: There were no complications. ENDOSCOPIC IMPRESSION: 1.   There was evidence of Barrett's esophagus; multiple biopsies 2.   The stomach mucosa appeared normal in the entire examined stomach - duodenum not examined by intent  RECOMMENDATIONS: 1.  Await biopsy results 2.  Proceed with a Colonoscopy.   eSigned:  Iva Boop, MD, [facility] 09/16/2012 9:55 AM CC:The Patient

## 2012-09-16 NOTE — Progress Notes (Signed)
Called to room to assist during endoscopic procedure.  Patient ID and intended procedure confirmed with present staff. Received instructions for my participation in the procedure from the performing physician.  

## 2012-09-17 ENCOUNTER — Telehealth: Payer: Self-pay | Admitting: *Deleted

## 2012-09-17 NOTE — Telephone Encounter (Signed)
  Follow up Call-  Call back number 09/16/2012  Post procedure Call Back phone  # [phone]  Permission to leave phone message Yes     Patient questions:  Do you have a fever, pain , or abdominal swelling? no Pain Score  0 *  Have you tolerated food without any problems? yes  Have you been able to return to your normal activities? yes  Do you have any questions about your discharge instructions: Diet   no Medications  no Follow up visit  no  Do you have questions or concerns about your Care? no  Actions: * If pain score is 4 or above: No action needed, pain <4.

## 2012-09-22 ENCOUNTER — Encounter: Payer: Self-pay | Admitting: Internal Medicine

## 2012-09-22 NOTE — Progress Notes (Signed)
Quick Note:  Diminutive adenomas No Barrett's this time  Repeat egd/colon 08/2017 ______

## 2012-10-10 ENCOUNTER — Other Ambulatory Visit: Payer: Self-pay | Admitting: Internal Medicine

## 2013-03-25 HISTORY — PX: CORNEAL TRANSPLANT: SHX108

## 2013-04-07 ENCOUNTER — Other Ambulatory Visit: Payer: Self-pay | Admitting: Internal Medicine

## 2013-04-08 NOTE — Telephone Encounter (Signed)
Verapamil and atorvastatin refilled per protocol. JG//CMA

## 2013-04-18 DIAGNOSIS — H16429 Pannus (corneal), unspecified eye: Secondary | ICD-10-CM | POA: Insufficient documentation

## 2013-04-18 DIAGNOSIS — I7771 Dissection of carotid artery: Secondary | ICD-10-CM | POA: Insufficient documentation

## 2013-04-23 DIAGNOSIS — D849 Immunodeficiency, unspecified: Secondary | ICD-10-CM | POA: Insufficient documentation

## 2013-06-18 ENCOUNTER — Encounter: Payer: Self-pay | Admitting: Internal Medicine

## 2013-06-18 ENCOUNTER — Ambulatory Visit (INDEPENDENT_AMBULATORY_CARE_PROVIDER_SITE_OTHER): Payer: 59 | Admitting: Internal Medicine

## 2013-06-18 VITALS — BP 130/80 | HR 73 | Temp 98.3°F | Resp 13 | Ht 67.0 in | Wt 220.0 lb

## 2013-06-18 DIAGNOSIS — Z0000 Encounter for general adult medical examination without abnormal findings: Secondary | ICD-10-CM

## 2013-06-18 DIAGNOSIS — E785 Hyperlipidemia, unspecified: Secondary | ICD-10-CM

## 2013-06-18 DIAGNOSIS — H16302 Unspecified interstitial keratitis, left eye: Secondary | ICD-10-CM | POA: Insufficient documentation

## 2013-06-18 DIAGNOSIS — E042 Nontoxic multinodular goiter: Secondary | ICD-10-CM

## 2013-06-18 DIAGNOSIS — I10 Essential (primary) hypertension: Secondary | ICD-10-CM

## 2013-06-18 MED ORDER — VERAPAMIL HCL ER 240 MG PO TBCR: EXTENDED_RELEASE_TABLET | ORAL | Status: DC

## 2013-06-18 NOTE — Progress Notes (Signed)
Pre visit review using our clinic review tool, if applicable. No additional management support is needed unless otherwise documented below in the visit note. 

## 2013-06-18 NOTE — Patient Instructions (Signed)
Minimal Blood Pressure Goal= AVERAGE < 140/90;  Ideal is an AVERAGE < 135/85. This AVERAGE should be calculated from @ least 5-7 BP readings taken @ different times of day on different days of week. You should not respond to isolated BP readings , but rather the AVERAGE for that week .Please bring your  blood pressure cuff to office visits to verify that it is reliable.It  can also be checked against the blood pressure device at the pharmacy. Finger or wrist cuffs are not dependable; an arm cuff is.  Your next office appointment will be determined based upon review of your pending labs. Those instructions will be transmitted to you through My Chart  . 

## 2013-06-18 NOTE — Progress Notes (Signed)
   Subjective:    Patient ID: Eric Dillon, male    DOB: 09-03-1955, 58 y.o.   MRN: [id number]  HPI  He is here for a physical;acute issues include interstitial keratitis. Extensive labs from Dr Silvestre Gunner reviewed     Review of Systems A heart healthy diet is not followed; no exercise due to Ophth issues.  Family history is negative for premature coronary disease. Advanced cholesterol testing not done to date.There is medication compliance with the statin.  Low dose ASA taken Specifically denied are  chest pain, palpitations, dyspnea, or claudication.  Significant abdominal symptoms, memory deficit, or myalgias not present.     Objective:   Physical Exam Gen.:  well-nourished in appearance. Alert, appropriate and cooperative throughout exam.  Head: Normocephalic without obvious abnormalities; pattern alopecia  Eyes: No corneal or conjunctival inflammation noted. Pupils unequal.Slight ptosis OS. Ears: External  ear exam reveals no significant lesions or deformities. Canals clear .TMs normal. Hearing is grossly normal bilaterally. Nose: External nasal exam reveals no deformity or inflammation. Nasal mucosa are pink and moist. No lesions or exudates noted.   Mouth: Oral mucosa and oropharynx reveal no lesions or exudates. Teeth in good repair. Neck: No deformities, masses, or tenderness noted. Range of motion slightly decreased. Thyroid :goiter on R. Lungs: Normal respiratory effort; chest expands symmetrically. Lungs are clear to auscultation without rales, wheezes, or increased work of breathing. Heart: Normal rate and rhythm. Normal S1 and S2. No gallop, click, or rub. No murmur. Abdomen: Bowel sounds normal; abdomen soft and nontender. No masses, organomegaly or hernias noted. Genitalia: as per Dr Alinda Money                                Musculoskeletal/extremities: No deformity or scoliosis noted of  the thoracic or lumbar spine.   No clubbing, cyanosis, edema, or significant extremity   deformity noted. Range of motion normal .Tone & strength normal. Hand joints normal .  Fingernail  health good. Able to lie down & sit up w/o help. Negative SLR bilaterally Vascular: Carotid, radial artery, dorsalis pedis and  posterior tibial pulses are full and equal. No bruits present. Neurologic: Alert and oriented x3. Deep tendon reflexes symmetrical and normal.  Gait normal  . Skin: Intact without suspicious lesions or rashes. Lymph: No cervical, axillary lymphadenopathy present. Psych: Mood and affect are normal. Normally interactive                                                                                       Assessment & Plan:  #1 comprehensive physical exam; no acute findings  Plan: see Orders  & Recommendations

## 2013-06-19 ENCOUNTER — Telehealth: Payer: Self-pay | Admitting: Internal Medicine

## 2013-06-19 NOTE — Telephone Encounter (Signed)
Relevant patient education assigned to patient using Emmi. ° °

## 2013-06-22 ENCOUNTER — Other Ambulatory Visit: Payer: 59

## 2013-06-22 ENCOUNTER — Ambulatory Visit
Admission: RE | Admit: 2013-06-22 | Discharge: 2013-06-22 | Disposition: A | Discharge status: Home / Self Care | Payer: 59 | Source: Ambulatory Visit | Attending: Internal Medicine | Admitting: Internal Medicine

## 2013-06-22 DIAGNOSIS — E042 Nontoxic multinodular goiter: Secondary | ICD-10-CM

## 2013-06-23 ENCOUNTER — Other Ambulatory Visit: Payer: Self-pay | Admitting: Internal Medicine

## 2013-06-23 ENCOUNTER — Other Ambulatory Visit (INDEPENDENT_AMBULATORY_CARE_PROVIDER_SITE_OTHER): Payer: 59

## 2013-06-23 DIAGNOSIS — Z0000 Encounter for general adult medical examination without abnormal findings: Secondary | ICD-10-CM

## 2013-06-23 LAB — TSH: TSH: 0.49 u[IU]/mL (ref 0.35–5.50)

## 2013-06-24 LAB — NMR LIPOPROFILE WITH LIPIDS
CHOLESTEROL, TOTAL: 191 mg/dL (ref <200)
HDL PARTICLE NUMBER: 36.1 umol/L (ref >=30.5)
HDL Size: 9.7 nm (ref >=9.2)
HDL-C: 71 mg/dL (ref >=40)
LARGE HDL: 12.3 umol/L (ref >=4.8)
LDL CALC: 95 mg/dL (ref <100)
LDL Particle Number: 1248 nmol/L — ABNORMAL HIGH (ref <1000)
LDL SIZE: 21.5 nm (ref >20.5)
LP-IR SCORE: 34 (ref <=45)
Large VLDL-P: 4.1 nmol/L — ABNORMAL HIGH (ref <=2.7)
SMALL LDL PARTICLE NUMBER: 355 nmol/L (ref <=527)
Triglycerides: 123 mg/dL (ref <150)
VLDL Size: 50.8 nm — ABNORMAL HIGH (ref <=46.6)

## 2013-06-26 ENCOUNTER — Encounter: Payer: Self-pay | Admitting: Internal Medicine

## 2013-07-15 ENCOUNTER — Other Ambulatory Visit: Payer: Self-pay | Admitting: Internal Medicine

## 2013-12-23 ENCOUNTER — Telehealth: Payer: Self-pay | Admitting: Internal Medicine

## 2013-12-23 MED ORDER — ESOMEPRAZOLE MAGNESIUM 40 MG PO CPDR: DELAYED_RELEASE_CAPSULE | ORAL | Status: DC

## 2013-12-23 NOTE — Telephone Encounter (Signed)
Refill sent in as requested and patient notified.

## 2014-01-08 ENCOUNTER — Other Ambulatory Visit: Payer: Self-pay | Admitting: Internal Medicine

## 2014-03-23 ENCOUNTER — Other Ambulatory Visit (INDEPENDENT_AMBULATORY_CARE_PROVIDER_SITE_OTHER): Payer: 59

## 2014-03-23 ENCOUNTER — Ambulatory Visit (INDEPENDENT_AMBULATORY_CARE_PROVIDER_SITE_OTHER): Payer: 59 | Admitting: Internal Medicine

## 2014-03-23 ENCOUNTER — Encounter: Payer: Self-pay | Admitting: Internal Medicine

## 2014-03-23 VITALS — BP 170/100 | HR 88 | Temp 98.0°F | Ht 67.0 in | Wt 226.5 lb

## 2014-03-23 DIAGNOSIS — K222 Esophageal obstruction: Secondary | ICD-10-CM

## 2014-03-23 DIAGNOSIS — I10 Essential (primary) hypertension: Secondary | ICD-10-CM

## 2014-03-23 DIAGNOSIS — R9431 Abnormal electrocardiogram [ECG] [EKG]: Secondary | ICD-10-CM

## 2014-03-23 DIAGNOSIS — R1013 Epigastric pain: Secondary | ICD-10-CM

## 2014-03-23 DIAGNOSIS — R079 Chest pain, unspecified: Secondary | ICD-10-CM

## 2014-03-23 DIAGNOSIS — K219 Gastro-esophageal reflux disease without esophagitis: Secondary | ICD-10-CM

## 2014-03-23 LAB — BASIC METABOLIC PANEL
BUN: 16 mg/dL (ref 6–23)
CALCIUM: 10 mg/dL (ref 8.4–10.5)
CO2: 25 meq/L (ref 19–32)
CREATININE: 1 mg/dL (ref 0.4–1.5)
Chloride: 105 mEq/L (ref 96–112)
GFR: 82.72 mL/min (ref >60.00)
Glucose, Bld: 116 mg/dL — ABNORMAL HIGH (ref 70–99)
Potassium: 4.3 mEq/L (ref 3.5–5.1)
Sodium: 139 mEq/L (ref 135–145)

## 2014-03-23 LAB — CBC WITH DIFFERENTIAL/PLATELET
Basophils Absolute: 0 10*3/uL (ref 0.0–0.1)
Basophils Relative: 0.6 % (ref 0.0–3.0)
Eosinophils Absolute: 0.1 10*3/uL (ref 0.0–0.7)
Eosinophils Relative: 1.5 % (ref 0.0–5.0)
HEMATOCRIT: 47.3 % (ref 39.0–52.0)
Hemoglobin: 15.6 g/dL (ref 13.0–17.0)
LYMPHS ABS: 0.7 10*3/uL (ref 0.7–4.0)
Lymphocytes Relative: 9.8 % — ABNORMAL LOW (ref 12.0–46.0)
MCHC: 32.9 g/dL (ref 30.0–36.0)
MCV: 106.3 fl — AB (ref 78.0–100.0)
MONO ABS: 0.8 10*3/uL (ref 0.1–1.0)
MONOS PCT: 11.2 % (ref 3.0–12.0)
Neutro Abs: 5.4 10*3/uL (ref 1.4–7.7)
Neutrophils Relative %: 76.9 % (ref 43.0–77.0)
PLATELETS: 287 10*3/uL (ref 150.0–400.0)
RBC: 4.46 Mil/uL (ref 4.22–5.81)
RDW: 12.6 % (ref 11.5–15.5)
WBC: 7 10*3/uL (ref 4.0–10.5)

## 2014-03-23 LAB — HEPATIC FUNCTION PANEL
ALK PHOS: 68 U/L (ref 39–117)
ALT: 62 U/L — ABNORMAL HIGH (ref 0–53)
AST: 25 U/L (ref 0–37)
Albumin: 4.3 g/dL (ref 3.5–5.2)
Bilirubin, Direct: 0.1 mg/dL (ref 0.0–0.3)
Total Bilirubin: 0.9 mg/dL (ref 0.2–1.2)
Total Protein: 7.4 g/dL (ref 6.0–8.3)

## 2014-03-23 LAB — LIPASE: LIPASE: 31 U/L (ref 11.0–59.0)

## 2014-03-23 LAB — AMYLASE: AMYLASE: 51 U/L (ref 27–131)

## 2014-03-23 LAB — TROPONIN I: TNIDX: 0 ug/l (ref 0.00–0.06)

## 2014-03-23 MED ORDER — RAMIPRIL 5 MG PO CAPS: 5.0000 mg | ORAL_CAPSULE | Freq: Every day | ORAL | Status: DC

## 2014-03-23 NOTE — Patient Instructions (Addendum)
Minimal Blood Pressure Goal= AVERAGE < 140/90;  Ideal is an AVERAGE < 135/85. This AVERAGE should be calculated from @ least 5-7 BP readings taken @ different times of day on different days of week. You should not respond to isolated BP readings , but rather the AVERAGE for that week .Please bring your  blood pressure cuff to office visits to verify that it is reliable.It  can also be checked against the blood pressure device at the pharmacy. Finger or wrist cuffs are not dependable; an arm cuff is.   Reflux of gastric acid may be asymptomatic as this may occur mainly during sleep.The triggers for reflux  include stress; the "aspirin family" ; alcohol; peppermint; and caffeine (coffee, tea, cola, and chocolate). The aspirin family would include aspirin and the nonsteroidal agents such as ibuprofen &  Naproxen. Tylenol would not cause reflux. If having symptoms ; food & drink should be avoided for @ least 2 hours before going to bed. Take the protein pump inhibitor 30 minutes before breakfast and 30 minutes before the evening meal for 8 weeks then go back to once a day  30 minutes before breakfast.  Take the EKG to any emergency room or preop visits. There are nonspecific changes; as long as there is no new change these are not clinically significant

## 2014-03-23 NOTE — Progress Notes (Signed)
Pre visit review using our clinic review tool, if applicable. No additional management support is needed unless otherwise documented below in the visit note. 

## 2014-03-23 NOTE — Progress Notes (Signed)
   Subjective:    Patient ID: Eric Dillon, male    DOB: 1955/09/05, 58 y.o.   MRN: [id number]  HPI He has been having epigastric and bilateral upper abdominal discomfort intermittently for 10 days, most recently this am. Occasionally it is substernal.This is nonexertional.  Burping does decrease the severity  He also has occasional palpitations  He describes some dysphagia with fluids occasionally. There is no food dysphagia.  His blood pressure is been 145/95 on average.  He's not on a heart healthy diet. He does restrict sodium  He's not been exercising due to knee symptoms but plans to do so.  PMH of Barrett's & stricture. Maternal grandfather had heart attack over age 66. Father had heart attack at 3.  Paternal grandmother had a stroke after 53.   Review of Systems   Tachycardia, exertional dyspnea, paroxysmal nocturnal dyspnea, claudication or edema are absent.  Unexplained weight loss,  significant dyspepsia,  melena, rectal bleeding, or persistently small caliber stools are denied.     Objective:   Physical Exam   Positive or pertinent findings include: Ptosis present on the left The left pupil is small. There is erythema of the oropharynx. Thyroid appears upper limits normal by palpation. Abdomen is protuberant. There is a small umbilical hernia.   General appearance :adequately nourished; in no distress. Eyes: No conjunctival inflammation or scleral icterus is present. Oral exam: Dental hygiene is good. Lips and gums are healthy appearing.There is no oropharyngeal  exudate noted.  Heart:  Normal rate and regular rhythm. S1 and S2 normal without gallop, murmur, click, rub or other extra sounds   Lungs:Chest clear to auscultation; no wheezes, rhonchi,rales ,or rubs present.No increased work of breathing.  Abdomen: bowel sounds normal, soft and non-tender without masses, or organomegaly noted.  No guarding or rebound.  Vascular : all pulses equal ; no  bruits present. Skin:Warm & dry.  Intact without suspicious lesions or rashes ; no jaundice or tenting Lymphatic: No lymphadenopathy is noted about the head, neck, axilla        Assessment & Plan:  #1 chest pain #2 HTN #3 GERD  Plan: EKG Add ACE-I;possible risk of cough & angioedema with ACE-I  Discussed BMET BID PPI CBC, LFT, amylase & lipase

## 2014-03-24 ENCOUNTER — Other Ambulatory Visit (INDEPENDENT_AMBULATORY_CARE_PROVIDER_SITE_OTHER): Payer: 59

## 2014-03-24 ENCOUNTER — Telehealth: Payer: Self-pay

## 2014-03-24 DIAGNOSIS — R739 Hyperglycemia, unspecified: Secondary | ICD-10-CM

## 2014-03-24 LAB — HEMOGLOBIN A1C: HEMOGLOBIN A1C: 6.1 % (ref 4.6–6.5)

## 2014-03-24 NOTE — Telephone Encounter (Signed)
-----   Message from Hendricks Limes, MD sent at 03/24/2014  7:08 AM EST ----- Please add A1c (R73.9)

## 2014-03-24 NOTE — Telephone Encounter (Signed)
Request for add on has been faxed to lab 

## 2014-07-04 ENCOUNTER — Other Ambulatory Visit: Payer: Self-pay | Admitting: Internal Medicine

## 2014-07-11 ENCOUNTER — Encounter: Payer: Self-pay | Admitting: Family

## 2014-07-11 ENCOUNTER — Ambulatory Visit (INDEPENDENT_AMBULATORY_CARE_PROVIDER_SITE_OTHER): Payer: 59 | Admitting: Family

## 2014-07-11 VITALS — BP 158/98 | HR 82 | Temp 98.2°F | Resp 18 | Ht 67.0 in | Wt 228.0 lb

## 2014-07-11 DIAGNOSIS — H6091 Unspecified otitis externa, right ear: Secondary | ICD-10-CM | POA: Diagnosis not present

## 2014-07-11 DIAGNOSIS — H6090 Unspecified otitis externa, unspecified ear: Secondary | ICD-10-CM | POA: Insufficient documentation

## 2014-07-11 MED ORDER — CIPROFLOXACIN-DEXAMETHASONE 0.3-0.1 % OT SUSP: 4.0000 [drp] | Freq: Two times a day (BID) | OTIC | Status: DC

## 2014-07-11 NOTE — Patient Instructions (Addendum)
Thank you for choosing [facility].  Summary/Instructions:  Your prescription(s) have been submitted to your pharmacy or been printed and provided for you. Please take as directed and contact our office if you believe you are having problem(s) with the medication(s) or have any questions.  If your symptoms worsen or fail to improve, please contact our office for further instruction, or in case of emergency go directly to the emergency room at the closest medical facility.      Otitis Externa Otitis externa is a bacterial or fungal infection of the outer ear canal. This is the area from the eardrum to the outside of the ear. Otitis externa is sometimes called "swimmer's ear." CAUSES  Possible causes of infection include:  Swimming in dirty water.  Moisture remaining in the ear after swimming or bathing.  Mild injury (trauma) to the ear.  Objects stuck in the ear (foreign body).  Cuts or scrapes (abrasions) on the outside of the ear. SIGNS AND SYMPTOMS  The first symptom of infection is often itching in the ear canal. Later signs and symptoms may include swelling and redness of the ear canal, ear pain, and yellowish-white fluid (pus) coming from the ear. The ear pain may be worse when pulling on the earlobe. DIAGNOSIS  Your health care provider will perform a physical exam. A sample of fluid may be taken from the ear and examined for bacteria or fungi. TREATMENT  Antibiotic ear drops are often given for 10 to 14 days. Treatment may also include pain medicine or corticosteroids to reduce itching and swelling. HOME CARE INSTRUCTIONS   Apply antibiotic ear drops to the ear canal as prescribed by your health care provider.  Take medicines only as directed by your health care provider.  If you have diabetes, follow any additional treatment instructions from your health care provider.  Keep all follow-up visits as directed by your health care provider. PREVENTION   Keep your  ear dry. Use the corner of a towel to absorb water out of the ear canal after swimming or bathing.  Avoid scratching or putting objects inside your ear. This can damage the ear canal or remove the protective wax that lines the canal. This makes it easier for bacteria and fungi to grow.  Avoid swimming in lakes, polluted water, or poorly chlorinated pools.  You may use ear drops made of rubbing alcohol and vinegar after swimming. Combine equal parts of white vinegar and alcohol in a bottle. Put 3 or 4 drops into each ear after swimming. SEEK MEDICAL CARE IF:   You have a fever.  Your ear is still red, swollen, painful, or draining pus after 3 days.  Your redness, swelling, or pain gets worse.  You have a severe headache.  You have redness, swelling, pain, or tenderness in the area behind your ear. MAKE SURE YOU:   Understand these instructions.  Will watch your condition.  Will get help right away if you are not doing well or get worse. Document Released: 03/11/2005 Document Revised: 07/26/2013 Document Reviewed: 03/28/2011 [facility] Patient Information 2015 [place], [place]. This information is not intended to replace advice given to you by your health care provider. Make sure you discuss any questions you have with your health care provider.

## 2014-07-11 NOTE — Assessment & Plan Note (Signed)
Symptoms and exam consistent with otitis externa. Start Ciprodex. Follow up if symptoms worsen or fail to improve.

## 2014-07-11 NOTE — Progress Notes (Signed)
   Subjective:    Patient ID: Eric Dillon, male    DOB: 1955-12-17, 59 y.o.   MRN: [id number]  Chief Complaint  Patient presents with  . Ear Pain    Right ear has been bothering him since yesterday morning, has some pain and says it feels clogged, feels like there is possible fluid in it    HPI:  Eric Dillon is a 59 y.o. male who presents today  This is a new problem. Associated symptom of right ear fullness and pain started about 1 day ago. Has tried OTC Zyrtec did not provide much relief. Denies any recent antibiotic use.    Allergies  Allergen Reactions  . Amoxicillin     REACTION: hives  . Codeine     REACTION: rash  . Sulfonamide Derivatives     REACTION: angioedema  . Erythromycin     REACTION: ? reaction    Current Outpatient Prescriptions on File Prior to Visit  Medication Sig Dispense Refill  . aspirin 325 MG buffered tablet Take 325 mg by mouth daily. TAKE WITH FOOD     . atorvastatin (LIPITOR) 40 MG tablet TAKE 1 TABLET DAILY 90 tablet 2  . esomeprazole (NEXIUM) 40 MG capsule TAKE 1 CAPSULE DAILY BEFORE BREAKFAST 90 capsule 0  . prednisoLONE acetate (PRED FORTE) 1 % ophthalmic suspension Place 1 drop into the left eye daily.     . ramipril (ALTACE) 5 MG capsule Take 1 capsule (5 mg total) by mouth daily. 30 capsule 3  . valACYclovir (VALTREX) 1000 MG tablet Take 1,000 mg by mouth daily.    . verapamil (CALAN-SR) 240 MG CR tablet TAKE 1 TABLET DAILY ( OFFICE VISIT NEEDED FOR ADDITIONAL REFILLS ) 90 tablet 2   No current facility-administered medications on file prior to visit.     Review of Systems  Constitutional: Negative for fever and chills.  HENT: Positive for ear pain and sore throat. Negative for congestion and sinus pressure.   Respiratory: Negative for cough.   Neurological: Negative for headaches.      Objective:    BP 158/98 mmHg  Pulse 82  Temp(Src) 98.2 F (36.8 C) (Oral)  Resp 18  Ht 5\' 7"  (1.702 m)  Wt 228 lb (103.42 kg)  BMI  35.70 kg/m2  SpO2 97% Nursing note and vital signs reviewed.  Physical Exam  Constitutional: He is oriented to person, place, and time. He appears well-developed and well-nourished. No distress.  HENT:  Right Ear: There is swelling. No tenderness. Decreased hearing is noted.  Left Ear: Hearing, tympanic membrane, external ear and ear canal normal.  Cardiovascular: Normal rate, regular rhythm, normal heart sounds and intact distal pulses.   Pulmonary/Chest: Effort normal and breath sounds normal.  Neurological: He is alert and oriented to person, place, and time.  Skin: Skin is warm and dry.  Psychiatric: He has a normal mood and affect. His behavior is normal. Judgment and thought content normal.       Assessment & Plan:

## 2014-07-11 NOTE — Progress Notes (Signed)
Pre visit review using our clinic review tool, if applicable. No additional management support is needed unless otherwise documented below in the visit note. 

## 2014-07-20 ENCOUNTER — Encounter: Payer: Self-pay | Admitting: Internal Medicine

## 2014-07-20 ENCOUNTER — Ambulatory Visit (INDEPENDENT_AMBULATORY_CARE_PROVIDER_SITE_OTHER): Payer: 59 | Admitting: Internal Medicine

## 2014-07-20 VITALS — BP 120/76 | HR 76 | Temp 98.0°F | Ht 67.0 in | Wt 227.0 lb

## 2014-07-20 DIAGNOSIS — H6121 Impacted cerumen, right ear: Secondary | ICD-10-CM

## 2014-07-20 DIAGNOSIS — H9201 Otalgia, right ear: Secondary | ICD-10-CM

## 2014-07-20 MED ORDER — NEOMYCIN-POLYMYXIN-HC 1 % OT SOLN: 3.0000 [drp] | OTIC | Status: DC

## 2014-07-20 NOTE — Progress Notes (Signed)
Pre visit review using our clinic review tool, if applicable. No additional management support is needed unless otherwise documented below in the visit note. 

## 2014-07-20 NOTE — Progress Notes (Signed)
   Subjective:    Patient ID: Eric Dillon, male    DOB: 1955-08-20, 59 y.o.   MRN: [id number]  HPI His symptoms began 07/10/14 as decreased hearing and fullness in the right ear. There was associated clear discharge. He also had dull pain. He was seen on the 18th and placed on ciprofloxacin/dexamethasone eardrops with initial improvement. As of 4/23 symptoms recurred. He has chronic tinnitus which is not associated.  He also describes some itchy eyes and sneezing. He's been using Zyrtec-D.  He has no other signs of upper respiratory tract infection  Review of Systems Frontal headache, facial pain , nasal purulence, dental pain, sore throat , otic pain or otic discharge denied. No fever , chills or sweats.  There is no significant cough, sputum production, wheezing,or  paroxysmal nocturnal dyspnea.    Objective:   Physical Exam  Pattern alopecia present There is marked erythema of the nasal septa bilaterally.  He has wax in the right ear which occludes visualization.  He has decreased hearing to whisper on the right.  General appearance:Adequately nourished; no acute distress or increased work of breathing is present.   Lymphatic: No  lymphadenopathy about the head, neck, or axilla . Eyes: No conjunctival inflammation or lid edema is present. There is no scleral icterus. Ears:  External ear exam shows no significant lesions or deformities.  L TM normal Nose:  External nasal examination shows no deformity or inflammation. No septal dislocation or deviation.No obstruction to airflow.  Oral exam: Dental hygiene is good; lips and gums are healthy appearing.There is no oropharyngeal erythema or exudate . Neck:  No deformities, thyromegaly, masses, or tenderness noted.   Supple with full range of motion without pain.  Heart:  Normal rate and regular rhythm. S1 and S2 normal without gallop, murmur, click, rub or other extra sounds. Lungs:Chest clear to auscultation; no wheezes,  rhonchi,rales ,or rubs present. Extremities:  No cyanosis, edema, or clubbing  noted  Skin: Warm & dry w/o tenting or jaundice. No significant lesions or rash.      Assessment & Plan:  #1 cerumen impaction with discomfort and decreased hearing After gavage & use of loop , large impaction removed in toto. R TM dull ; slight inflammation in canal @ TM. Hearing improved.

## 2014-07-20 NOTE — Patient Instructions (Signed)
Please do not use Q-tips as we discussed. Should wax build up occur, please put 2-3 drops of mineral oil in the affected  ear at night to soften the wax .Cover the canal with a  cotton ball to prevent the oil from staining bed linens. In the morning fill the ear canal with hydrogen peroxide & lie in the opposite lateral decubitus position(on the side opposite the affected ear)  for 10-15 minutes. After allowing this period of time for the peroxide to dissolve the wax ;shower and use the thinnest washrag available to wick out the wax. If both ears are involved ; alternate this treatment from ear to ear each night until no wax is found on the washrag.

## 2014-07-27 ENCOUNTER — Encounter: Payer: Self-pay | Admitting: Internal Medicine

## 2014-07-28 ENCOUNTER — Other Ambulatory Visit: Payer: Self-pay

## 2014-07-28 DIAGNOSIS — I10 Essential (primary) hypertension: Secondary | ICD-10-CM

## 2014-07-28 MED ORDER — RAMIPRIL 5 MG PO CAPS: 5.0000 mg | ORAL_CAPSULE | Freq: Every day | ORAL | Status: DC

## 2014-09-29 ENCOUNTER — Other Ambulatory Visit: Payer: Self-pay | Admitting: Internal Medicine

## 2014-10-28 ENCOUNTER — Other Ambulatory Visit: Payer: Self-pay | Admitting: Internal Medicine

## 2014-12-07 ENCOUNTER — Encounter: Payer: Self-pay | Admitting: Internal Medicine

## 2014-12-07 ENCOUNTER — Ambulatory Visit (INDEPENDENT_AMBULATORY_CARE_PROVIDER_SITE_OTHER): Payer: 59 | Admitting: Internal Medicine

## 2014-12-07 VITALS — BP 132/84 | HR 65 | Temp 98.3°F | Resp 16 | Wt 232.0 lb

## 2014-12-07 DIAGNOSIS — L409 Psoriasis, unspecified: Secondary | ICD-10-CM

## 2014-12-07 MED ORDER — MOMETASONE FUROATE 0.1 % EX CREA: 1.0000 "application " | TOPICAL_CREAM | Freq: Every day | CUTANEOUS | Status: DC

## 2014-12-07 MED ORDER — VERAPAMIL HCL ER 240 MG PO TBCR: EXTENDED_RELEASE_TABLET | ORAL | Status: DC

## 2014-12-07 NOTE — Patient Instructions (Signed)
Use T-Gel , a coal tar shampoo, 2-3 times per week. This will have an antibacterial effect on scalp lesions.

## 2014-12-07 NOTE — Progress Notes (Signed)
Pre visit review using our clinic review tool, if applicable. No additional management support is needed unless otherwise documented below in the visit note. 

## 2014-12-07 NOTE — Progress Notes (Signed)
   Subjective:    Patient ID: Eric Dillon, male    DOB: 18-Jan-1956, 59 y.o.   MRN: [id number]  HPI   He was diagnosed with psoriasis over a decade ago and was prescribed shampoos topically which were of some benefit by a Dermatologist.  He was on prednisone orally for 6 months following corneal transplant. On oral steroids there was dramatic resolution of the psoriasis. This was discontinued in June of last year and the psoriasis has recurred as of January of this year.   Review of Systems  No associated itchy, watery eyes.  Swelling of the lips or tongue denied.  Shortness of breath, wheezing, or cough absent.  No rash or urticaria noted.  Fever ,chills , or sweats denied. Purulence absent.  Diarrhea not present.  He denies any arthritic symptoms.     Objective:   Physical Exam  He has rough, erythematous, slightly scaly dermatitis mainly over the right external ear and in the retroauricular area over the mastoid area. He also has some at the scalp line. There is lesser involvement on the left.  General appearance:Adequately nourished; no acute distress or increased work of breathing is present.    Lymphatic: No  lymphadenopathy about the head, neck, or axilla .  Eyes: No conjunctival inflammation or lid edema is present. There is no scleral icterus.  Ears:  External ear exam shows no significant deformities.  Otoscopic examination reveals clear canals, tympanic membranes are intact bilaterally without bulging, retraction, inflammation or discharge.  Nose:  External nasal examination shows no deformity or inflammation. Nasal mucosa are pink and moist without lesions or exudates No septal dislocation or deviation.No obstruction to airflow.   Oral exam: Dental hygiene is good; lips and gums are healthy appearing.There is no oropharyngeal erythema or exudate .  Neck:  No deformities, thyromegaly, masses, or tenderness noted.     Heart:  Normal rate and regular rhythm.  S1 and S2 normal without gallop, murmur, click, rub or other extra sounds.   Lungs:Chest clear to auscultation; no wheezes, rhonchi,rales ,or rubs present.  Extremities:  No cyanosis, edema, or clubbing  noted    Skin: Warm & dry w/o tenting or jaundice.     Assessment & Plan:  #1 psoriasis w/o arthritic symptoms/signs  Plan: See orders and recommendations

## 2014-12-08 DIAGNOSIS — L409 Psoriasis, unspecified: Secondary | ICD-10-CM | POA: Insufficient documentation

## 2014-12-14 DIAGNOSIS — H2513 Age-related nuclear cataract, bilateral: Secondary | ICD-10-CM | POA: Insufficient documentation

## 2015-03-04 ENCOUNTER — Other Ambulatory Visit: Payer: Self-pay | Admitting: Internal Medicine

## 2015-03-15 ENCOUNTER — Ambulatory Visit (INDEPENDENT_AMBULATORY_CARE_PROVIDER_SITE_OTHER): Payer: 59 | Admitting: Internal Medicine

## 2015-03-15 ENCOUNTER — Encounter: Payer: Self-pay | Admitting: Internal Medicine

## 2015-03-15 VITALS — BP 148/100 | HR 80 | Ht 67.0 in | Wt 236.4 lb

## 2015-03-15 DIAGNOSIS — K222 Esophageal obstruction: Secondary | ICD-10-CM

## 2015-03-15 DIAGNOSIS — K2270 Barrett's esophagus without dysplasia: Secondary | ICD-10-CM | POA: Diagnosis not present

## 2015-03-15 DIAGNOSIS — K219 Gastro-esophageal reflux disease without esophagitis: Secondary | ICD-10-CM

## 2015-03-15 MED ORDER — ESOMEPRAZOLE MAGNESIUM 40 MG PO CPDR: 40.0000 mg | DELAYED_RELEASE_CAPSULE | Freq: Every day | ORAL | Status: AC

## 2015-03-15 NOTE — Progress Notes (Signed)
   Subjective:    Patient ID: Eric Dillon, male    DOB: 1955/08/02, 58 y.o.   MRN: [id number] Chief Complaint: Follow-up of GERD HPI  Eric Dillon is here for follow-up. He needs a medication refill. He was last seen about 2 years ago. He has a history of acid reflux and stricture and does well as long as he takes his PPI. There is a history of Barrett's esophagus in the past though not on recent endoscopy. He reports that in 2017 his formulary were changed and his Nexium generic will cost him $500 for 3 month supply so he anticipates switching to over-the-counter. He was to know if that is acceptable. Medications, allergies, past medical history, past surgical history, family history and social history are reviewed and updated in the EMR.  Review of Systems  As above    Objective:   Physical Exam BP 148/100 mmHg  Pulse 80  Ht 5\' 7"  (1.702 m)  Wt 236 lb 6.4 oz (107.23 kg)  BMI 37.02 kg/m2     Assessment & Plan:  GERD with stricture Doing well Is having formulary change and esomeprazole will be $500 after 03/26/2015 He will probably try OTC Nexium  If that doesn't work we will look at formulary for others   [place] - not by 2015 criteria Current diagnostic criteria would say he does not have Barrett's esophagus based upon the size of columnar tongues initially appreciated being less than a centimeter, and then he had no intestinal metaplasia on biopsies in 2014 so he may not need a follow-up exam. We can discuss that in the future.

## 2015-03-15 NOTE — Assessment & Plan Note (Signed)
Doing well Is having formulary change and esomeprazole will be $500 after 03/26/2015 He will probably try OTC Nexium  If that doesn't work we will look at formulary for others

## 2015-03-15 NOTE — Patient Instructions (Signed)
   Hope things stay ok when you switch to nexium OTC. Let me know if not.  I appreciate the opportunity to care for you. Gatha Mayer, MD, [facility]

## 2015-03-15 NOTE — Assessment & Plan Note (Addendum)
Current diagnostic criteria would say he does not have Barrett's esophagus based upon the size of columnar tongues initially appreciated being less than a centimeter, and then he had no intestinal metaplasia on biopsies in 2014 so he may not need a follow-up exam. We can discuss that in the future.

## 2015-05-20 ENCOUNTER — Other Ambulatory Visit: Payer: Self-pay | Admitting: Internal Medicine

## 2015-05-30 ENCOUNTER — Ambulatory Visit (INDEPENDENT_AMBULATORY_CARE_PROVIDER_SITE_OTHER): Payer: 59 | Admitting: Internal Medicine

## 2015-05-30 ENCOUNTER — Encounter: Payer: Self-pay | Admitting: Internal Medicine

## 2015-05-30 VITALS — BP 160/106 | HR 71 | Temp 98.7°F | Resp 16 | Wt 232.0 lb

## 2015-05-30 DIAGNOSIS — Z23 Encounter for immunization: Secondary | ICD-10-CM | POA: Diagnosis not present

## 2015-05-30 DIAGNOSIS — E782 Mixed hyperlipidemia: Secondary | ICD-10-CM

## 2015-05-30 DIAGNOSIS — Z8546 Personal history of malignant neoplasm of prostate: Secondary | ICD-10-CM

## 2015-05-30 DIAGNOSIS — E042 Nontoxic multinodular goiter: Secondary | ICD-10-CM | POA: Diagnosis not present

## 2015-05-30 DIAGNOSIS — H18891 Other specified disorders of cornea, right eye: Secondary | ICD-10-CM | POA: Insufficient documentation

## 2015-05-30 DIAGNOSIS — I7771 Dissection of carotid artery: Secondary | ICD-10-CM

## 2015-05-30 DIAGNOSIS — I10 Essential (primary) hypertension: Secondary | ICD-10-CM

## 2015-05-30 DIAGNOSIS — Z947 Corneal transplant status: Secondary | ICD-10-CM | POA: Insufficient documentation

## 2015-05-30 MED ORDER — ATORVASTATIN CALCIUM 40 MG PO TABS: 40.0000 mg | ORAL_TABLET | Freq: Every day | ORAL | Status: DC

## 2015-05-30 MED ORDER — RAMIPRIL 2.5 MG PO CAPS: 2.5000 mg | ORAL_CAPSULE | Freq: Every day | ORAL | Status: DC

## 2015-05-30 NOTE — Assessment & Plan Note (Signed)
Check tsh 

## 2015-05-30 NOTE — Assessment & Plan Note (Signed)
Following with urology No evidence of recurrence 

## 2015-05-30 NOTE — Progress Notes (Signed)
Subjective:    Patient ID: Eric Dillon, male    DOB: 1955/05/14, 60 y.o.   MRN: [id number]  HPI He is here to establish with a new pcp.   He is here for follow up.   Hyperlipidemia: He is taking his medication daily. He is compliant with a low fat/cholesterol diet. He is exercising regularly - just started to exercise and is working on increasing his exercise. He denies myalgias.   Hypertension: He is taking his medication daily - -verapamil only. When he was taking the ramipril 5 mg daily it was dropping his blood pressure too low and he has only taken it as needed for the past few months when his BP is high.  The last time it took it was a few months ago. He is compliant with a low sodium diet.  He denies chest pain, palpitations, edema, shortness of breath and regular headaches. He is exercising regularly - just started.  He does monitor his blood pressure at home but not as often as he should - it is variable, but is 135/80-85 on average.    GERD:  He is taking his medication daily as prescribed.  He denies any GERD symptoms and feels his GERD is well controlled.   For one year he has had a nonhealing sore on his nose. It will scab over and start to heal, but does open up again. He is wondering what he can do to make it heal. He has been applying Neosporin.   Medications and allergies reviewed with patient and updated if appropriate.  Patient Active Problem List   Diagnosis Date Noted  . Psoriasis 12/08/2014  . Otitis externa 07/11/2014  . Nonspecific abnormal electrocardiogram (ECG) (EKG) 03/23/2014  . 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 Dissection of carotid artery ([place]) 01/19/2010  . 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 BARRETTS ESOPHAGUS - not by 2015 criteria 12/06/2009  . PROSTATE CANCER, HX OF 12/28/2008  . OTHER AND UNSPECIFIED HYPERLIPIDEMIA 12/21/2008  . Personal history of colonic adenomas 01/14/2008  . Nontoxic multinodular goiter 03/03/2007  .  HYPERLIPIDEMIA 03/03/2007  . HYPERTENSION 03/03/2007  . GERD with stricture 03/03/2007  . CEREBROVASCULAR ACCIDENT, HX OF 03/03/2007    Current Outpatient Prescriptions on File Prior to Visit  Medication Sig Dispense Refill  . aspirin 325 MG buffered tablet Take 325 mg by mouth daily. TAKE WITH FOOD     . atorvastatin (LIPITOR) 40 MG tablet TAKE 1 TABLET DAILY 30 tablet 0  . esomeprazole (NEXIUM) 40 MG capsule Take 1 capsule (40 mg total) by mouth daily before breakfast. 90 capsule 3  . prednisoLONE acetate (PRED FORTE) 1 % ophthalmic suspension Place 1 drop into the left eye daily.     . ramipril (ALTACE) 5 MG capsule TAKE 1 CAPSULE(5 MG) BY MOUTH DAILY 30 capsule 5  . valACYclovir (VALTREX) 1000 MG tablet Take 1,000 mg by mouth daily.    . verapamil (CALAN-SR) 240 MG CR tablet TAKE 1 TABLET DAILY ( OFFICE VISIT NEEDED FOR ADDITIONAL REFILLS ) 90 tablet 3   No current facility-administered medications on file prior to visit.    Past Medical History  Diagnosis Date  . Hyperlipidemia   . Hypertension   . GERD (gastroesophageal reflux disease)   . Prostate cancer ([place])   . Goiter   . Barrett's esophagus 2008  . Diverticulosis   . Hemorrhoids   . Stroke ([place]) 1996    FROM DISSECTING R CAROID ARTERY   . Carotid  artery dissection ([place]) 2011    L  . Personal history of colonic adenomas 01/14/2008  . Horner's syndrome 2012    Past Surgical History  Procedure Laterality Date  . Dissection of caroid artery      WITH CVA  . Knee surgery      RIGHT..TO STRIGHTEN PATELLA  . Robotic prostatectomy      Dr Dutch Gray  . Carotid artery angioplasty  2011    L  . Biopsy thyroid  2007    colloid  admixed with scant follicular epithelium  . Colonoscopy w/ biopsies and polypectomy  multiple    adenomatous polyps, internal hemorrhoids, diverticulosis  . Upper gastrointestinal endoscopy  multiple    w/dilation, Barrett's , esophageal ring, hiatal hernia, gastritis  . Corneal transplant  Left 2015     corneal pannus & perforation,[facility]    Social History   Social History  . Marital Status: Married    Spouse Name: N/A  . Number of Children: 2  . Years of Education: N/A   Occupational History  . Analyst    Social History Main Topics  . Smoking status: Former Smoker    Quit date: 01/23/2010  . Smokeless tobacco: Never Used     Comment: smoked 1974-2011, up to 1 ppd  . Alcohol Use: 12.6 - 16.8 oz/week    21-28 Cans of beer per week  . Drug Use: No  . Sexual Activity: Not on file   Other Topics Concern  . Not on file   Social History Narrative   Gets reg exercise    Family History  Problem Relation Age of Onset  . Diabetes Mother   . Stroke Father 56    CVA,BLOOD CLOT TO BRAIN  . CAD Father     BYPASS  . Stroke Paternal Grandmother 81    MULTIPLE CVA'S; onset late70s  . Cancer Neg Hx   . Colon cancer Neg Hx   . Esophageal cancer Neg Hx   . Rectal cancer Neg Hx   . Stomach cancer Neg Hx     Review of Systems  Constitutional: Negative for fever.  Respiratory: Negative for cough, shortness of breath and wheezing.   Cardiovascular: Negative for chest pain, palpitations and leg swelling.  Gastrointestinal: Negative for nausea and abdominal pain.       GERD controlled  Neurological: Negative for dizziness, light-headedness and headaches.       Objective:   Filed Vitals:   05/30/15 1603  BP: 160/106  Pulse: 71  Temp: 98.7 F (37.1 C)  Resp: 16   Filed Weights   05/30/15 1603  Weight: 232 lb (105.235 kg)   Body mass index is 36.33 kg/(m^2).   Physical Exam Constitutional: Appears well-developed and well-nourished. No distress.  Neck: Neck supple. No tracheal deviation present. No thyromegaly present.  No carotid bruit. No cervical adenopathy.   Cardiovascular: Normal rate, regular rhythm and normal heart sounds.   No murmur heard.  No edema Pulmonary/Chest: Effort normal and breath sounds normal. No respiratory distress. No wheezes.        Assessment & Plan:   GERD  Following with GI GERD controlled Continue nexium   See Problem List for Assessment and Plan of chronic medical problems.  tdap today  Blood work soon - ordered   Follow up annually

## 2015-05-30 NOTE — Assessment & Plan Note (Signed)
Check lipid panel - has been controlled Continue lipitor 40 mg daily

## 2015-05-30 NOTE — Assessment & Plan Note (Signed)
BP here elevated, but typically is elevated at the doctors Variable at home, but lower Continue verapamil 240 mg daily Start ramipril 2.5mg  daily Monitor bp at home - call if not controlled Check cmp

## 2015-05-30 NOTE — Progress Notes (Signed)
Pre visit review using our clinic review tool, if applicable. No additional management support is needed unless otherwise documented below in the visit note. 

## 2015-05-30 NOTE — Patient Instructions (Signed)
   Test(s) ordered today. Your results will be released to [place] (or called to you) after review, usually within 72hours after test completion. If any changes need to be made, you will be notified at that same time.  All other Health Maintenance issues reviewed.   All recommended immunizations and age-appropriate screenings are up-to-date.  Tetanus, tdap, administered today.   Medications reviewed and updated.  Changes include changing your ramipril from 5 mg daily to 2.5mg  daily.   Your prescription(s) have been submitted to your pharmacy. Please take as directed and contact our office if you believe you are having problem(s) with the medication(s).   Please followup annually, sooner if needed.

## 2015-06-08 ENCOUNTER — Other Ambulatory Visit: Payer: Self-pay | Admitting: Internal Medicine

## 2015-06-08 ENCOUNTER — Encounter: Payer: Self-pay | Admitting: Internal Medicine

## 2015-06-08 ENCOUNTER — Other Ambulatory Visit (INDEPENDENT_AMBULATORY_CARE_PROVIDER_SITE_OTHER): Payer: 59

## 2015-06-08 DIAGNOSIS — E042 Nontoxic multinodular goiter: Secondary | ICD-10-CM | POA: Diagnosis not present

## 2015-06-08 DIAGNOSIS — I10 Essential (primary) hypertension: Secondary | ICD-10-CM

## 2015-06-08 DIAGNOSIS — E782 Mixed hyperlipidemia: Secondary | ICD-10-CM

## 2015-06-08 DIAGNOSIS — E119 Type 2 diabetes mellitus without complications: Secondary | ICD-10-CM

## 2015-06-08 DIAGNOSIS — I7771 Dissection of carotid artery: Secondary | ICD-10-CM

## 2015-06-08 DIAGNOSIS — E1169 Type 2 diabetes mellitus with other specified complication: Secondary | ICD-10-CM | POA: Insufficient documentation

## 2015-06-08 LAB — CBC WITH DIFFERENTIAL/PLATELET
BASOS PCT: 1.1 % (ref 0.0–3.0)
Basophils Absolute: 0.1 10*3/uL (ref 0.0–0.1)
Eosinophils Absolute: 0.2 10*3/uL (ref 0.0–0.7)
Eosinophils Relative: 3.3 % (ref 0.0–5.0)
HCT: 47.5 % (ref 39.0–52.0)
HEMOGLOBIN: 16.1 g/dL (ref 13.0–17.0)
LYMPHS PCT: 15.7 % (ref 12.0–46.0)
Lymphs Abs: 1.1 10*3/uL (ref 0.7–4.0)
MCHC: 33.9 g/dL (ref 30.0–36.0)
MCV: 100.6 fl — ABNORMAL HIGH (ref 78.0–100.0)
MONOS PCT: 9.9 % (ref 3.0–12.0)
Monocytes Absolute: 0.7 10*3/uL (ref 0.1–1.0)
NEUTROS ABS: 5 10*3/uL (ref 1.4–7.7)
Neutrophils Relative %: 70 % (ref 43.0–77.0)
PLATELETS: 306 10*3/uL (ref 150.0–400.0)
RBC: 4.73 Mil/uL (ref 4.22–5.81)
RDW: 13.2 % (ref 11.5–15.5)
WBC: 7.2 10*3/uL (ref 4.0–10.5)

## 2015-06-08 LAB — COMPREHENSIVE METABOLIC PANEL
ALT: 50 U/L (ref 0–53)
AST: 23 U/L (ref 0–37)
Albumin: 4.2 g/dL (ref 3.5–5.2)
Alkaline Phosphatase: 75 U/L (ref 39–117)
BUN: 15 mg/dL (ref 6–23)
CALCIUM: 9.7 mg/dL (ref 8.4–10.5)
CHLORIDE: 101 meq/L (ref 96–112)
CO2: 30 meq/L (ref 19–32)
CREATININE: 0.97 mg/dL (ref 0.40–1.50)
GFR: 84.33 mL/min (ref >60.00)
GLUCOSE: 124 mg/dL — AB (ref 70–99)
Potassium: 4.4 mEq/L (ref 3.5–5.1)
Sodium: 138 mEq/L (ref 135–145)
Total Bilirubin: 0.7 mg/dL (ref 0.2–1.2)
Total Protein: 7.1 g/dL (ref 6.0–8.3)

## 2015-06-08 LAB — TSH: TSH: 0.66 u[IU]/mL (ref 0.35–4.50)

## 2015-06-08 LAB — LIPID PANEL
CHOLESTEROL: 181 mg/dL (ref 0–200)
HDL: 46.2 mg/dL (ref >39.00)
NonHDL: 135.24
TRIGLYCERIDES: 252 mg/dL — AB (ref 0.0–149.0)
Total CHOL/HDL Ratio: 4
VLDL: 50.4 mg/dL — ABNORMAL HIGH (ref 0.0–40.0)

## 2015-06-08 LAB — HEMOGLOBIN A1C: Hgb A1c MFr Bld: 6.5 % (ref 4.6–6.5)

## 2015-06-08 LAB — LDL CHOLESTEROL, DIRECT: LDL DIRECT: 107 mg/dL

## 2015-08-03 ENCOUNTER — Encounter: Payer: Self-pay | Admitting: Internal Medicine

## 2016-02-15 ENCOUNTER — Other Ambulatory Visit: Payer: Self-pay | Admitting: Internal Medicine

## 2016-02-21 ENCOUNTER — Other Ambulatory Visit: Payer: Self-pay | Admitting: *Deleted

## 2016-02-21 MED ORDER — VERAPAMIL HCL ER 240 MG PO TBCR: EXTENDED_RELEASE_TABLET | ORAL | 1 refills | Status: DC

## 2016-02-21 NOTE — Telephone Encounter (Signed)
Rec'd call pt is needing [facility] his verapamil. Inform pt will send electronically to mail service...Eric Dillon

## 2016-05-29 DIAGNOSIS — H5712 Ocular pain, left eye: Secondary | ICD-10-CM | POA: Diagnosis not present

## 2016-05-29 DIAGNOSIS — Z947 Corneal transplant status: Secondary | ICD-10-CM | POA: Diagnosis not present

## 2016-05-29 DIAGNOSIS — H2513 Age-related nuclear cataract, bilateral: Secondary | ICD-10-CM | POA: Diagnosis not present

## 2016-05-30 DIAGNOSIS — Z85828 Personal history of other malignant neoplasm of skin: Secondary | ICD-10-CM | POA: Diagnosis not present

## 2016-06-05 ENCOUNTER — Other Ambulatory Visit: Payer: Self-pay | Admitting: *Deleted

## 2016-06-05 DIAGNOSIS — Z719 Counseling, unspecified: Secondary | ICD-10-CM | POA: Diagnosis not present

## 2016-06-05 MED ORDER — ATORVASTATIN CALCIUM 40 MG PO TABS: 40.0000 mg | ORAL_TABLET | Freq: Every day | ORAL | 0 refills | Status: DC

## 2016-06-05 MED ORDER — RAMIPRIL 2.5 MG PO CAPS: 2.5000 mg | ORAL_CAPSULE | Freq: Every day | ORAL | 0 refills | Status: DC

## 2016-06-05 NOTE — Telephone Encounter (Signed)
Left msg on triage needing refills on his lipitor and ramipril sent to [facility]. Sent electronically #[phone] will need appt for future refills...Johny Chess

## 2016-06-06 ENCOUNTER — Other Ambulatory Visit (INDEPENDENT_AMBULATORY_CARE_PROVIDER_SITE_OTHER): Payer: 59

## 2016-06-06 ENCOUNTER — Encounter: Payer: Self-pay | Admitting: Internal Medicine

## 2016-06-06 DIAGNOSIS — E119 Type 2 diabetes mellitus without complications: Secondary | ICD-10-CM

## 2016-06-06 LAB — MICROALBUMIN / CREATININE URINE RATIO
CREATININE, U: 302.8 mg/dL
MICROALB/CREAT RATIO: 0.7 mg/g (ref 0.0–30.0)
Microalb, Ur: 2 mg/dL — ABNORMAL HIGH (ref 0.0–1.9)

## 2016-06-06 LAB — COMPREHENSIVE METABOLIC PANEL
ALT: 44 U/L (ref 0–53)
AST: 23 U/L (ref 0–37)
Albumin: 4.1 g/dL (ref 3.5–5.2)
Alkaline Phosphatase: 62 U/L (ref 39–117)
BUN: 16 mg/dL (ref 6–23)
CO2: 29 meq/L (ref 19–32)
Calcium: 9.9 mg/dL (ref 8.4–10.5)
Chloride: 102 mEq/L (ref 96–112)
Creatinine, Ser: 1 mg/dL (ref 0.40–1.50)
GFR: 81.14 mL/min (ref >60.00)
GLUCOSE: 114 mg/dL — AB (ref 70–99)
POTASSIUM: 4.3 meq/L (ref 3.5–5.1)
Sodium: 138 mEq/L (ref 135–145)
Total Bilirubin: 0.5 mg/dL (ref 0.2–1.2)
Total Protein: 7 g/dL (ref 6.0–8.3)

## 2016-06-06 LAB — HEMOGLOBIN A1C: Hgb A1c MFr Bld: 6.2 % (ref 4.6–6.5)

## 2016-06-12 DIAGNOSIS — H189 Unspecified disorder of cornea: Secondary | ICD-10-CM | POA: Diagnosis not present

## 2016-06-12 DIAGNOSIS — Z719 Counseling, unspecified: Secondary | ICD-10-CM | POA: Diagnosis not present

## 2016-06-12 DIAGNOSIS — Z947 Corneal transplant status: Secondary | ICD-10-CM | POA: Diagnosis not present

## 2016-06-12 DIAGNOSIS — H5712 Ocular pain, left eye: Secondary | ICD-10-CM | POA: Diagnosis not present

## 2016-06-19 DIAGNOSIS — Z719 Counseling, unspecified: Secondary | ICD-10-CM | POA: Diagnosis not present

## 2016-06-26 ENCOUNTER — Encounter: Payer: Self-pay | Admitting: Internal Medicine

## 2016-06-26 ENCOUNTER — Ambulatory Visit (INDEPENDENT_AMBULATORY_CARE_PROVIDER_SITE_OTHER): Payer: 59 | Admitting: Internal Medicine

## 2016-06-26 VITALS — BP 154/82 | HR 75 | Temp 98.1°F | Resp 16 | Ht 67.0 in | Wt 225.0 lb

## 2016-06-26 DIAGNOSIS — E782 Mixed hyperlipidemia: Secondary | ICD-10-CM | POA: Diagnosis not present

## 2016-06-26 DIAGNOSIS — Z719 Counseling, unspecified: Secondary | ICD-10-CM | POA: Diagnosis not present

## 2016-06-26 DIAGNOSIS — Z23 Encounter for immunization: Secondary | ICD-10-CM

## 2016-06-26 DIAGNOSIS — Z8546 Personal history of malignant neoplasm of prostate: Secondary | ICD-10-CM | POA: Diagnosis not present

## 2016-06-26 DIAGNOSIS — Z0000 Encounter for general adult medical examination without abnormal findings: Secondary | ICD-10-CM | POA: Diagnosis not present

## 2016-06-26 DIAGNOSIS — K222 Esophageal obstruction: Secondary | ICD-10-CM | POA: Diagnosis not present

## 2016-06-26 DIAGNOSIS — K219 Gastro-esophageal reflux disease without esophagitis: Secondary | ICD-10-CM | POA: Diagnosis not present

## 2016-06-26 DIAGNOSIS — I10 Essential (primary) hypertension: Secondary | ICD-10-CM | POA: Diagnosis not present

## 2016-06-26 DIAGNOSIS — E119 Type 2 diabetes mellitus without complications: Secondary | ICD-10-CM | POA: Diagnosis not present

## 2016-06-26 MED ORDER — RAMIPRIL 2.5 MG PO CAPS: 2.5000 mg | ORAL_CAPSULE | Freq: Every day | ORAL | 3 refills | Status: DC

## 2016-06-26 MED ORDER — VERAPAMIL HCL ER 240 MG PO TBCR: EXTENDED_RELEASE_TABLET | ORAL | 3 refills | Status: DC

## 2016-06-26 MED ORDER — ATORVASTATIN CALCIUM 40 MG PO TABS: 40.0000 mg | ORAL_TABLET | Freq: Every day | ORAL | 3 refills | Status: DC

## 2016-06-26 NOTE — Assessment & Plan Note (Signed)
Follows with Dr Borden 

## 2016-06-26 NOTE — Assessment & Plan Note (Signed)
Lab Results  Component Value Date   HGBA1C 6.2 06/06/2016   Diet controlled Continue regular exercise Weight loss recommended

## 2016-06-26 NOTE — Assessment & Plan Note (Signed)
Continue statin. 

## 2016-06-26 NOTE — Assessment & Plan Note (Signed)
Taking otc nexium  GERD controlled Continue daily medication

## 2016-06-26 NOTE — Assessment & Plan Note (Addendum)
Elevated here today but well controlled at home Continue current medications Continue to monitor at home

## 2016-06-26 NOTE — Progress Notes (Signed)
Subjective:    Patient ID: Eric Dillon, male    DOB: Aug 15, 1955, 61 y.o.   MRN: [id number]  HPI He is here for a physical exam.   He denies changes in his health and has no questions.   He monitors his BP at home and it is well controlled 120-130's/70's.  Medications and allergies reviewed with patient and updated if appropriate.  Patient Active Problem List   Diagnosis Date Noted  . Diabetes ([place]) 06/08/2015  . H/O cornea transplant, left 05/30/2015  . Psoriasis 12/08/2014  . Nonspecific abnormal electrocardiogram (ECG) (EKG) 03/23/2014  . 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 BARRETTS ESOPHAGUS - not by 2015 criteria 12/06/2009  . PROSTATE CANCER, HX OF 12/28/2008  . Personal history of colonic adenomas 01/14/2008  . Nontoxic multinodular goiter 03/03/2007  . HYPERLIPIDEMIA 03/03/2007  . Essential hypertension 03/03/2007  . GERD with stricture 03/03/2007  . CEREBROVASCULAR ACCIDENT, HX OF 03/03/2007    Current Outpatient Prescriptions on File Prior to Visit  Medication Sig Dispense Refill  . aspirin 325 MG buffered tablet Take 325 mg by mouth daily. TAKE WITH FOOD     . atorvastatin (LIPITOR) 40 MG tablet Take 1 tablet (40 mg total) by mouth daily. Yearly physical w/labs are due must see MD for refills 90 tablet 0  . esomeprazole (NEXIUM) 40 MG capsule Take 1 capsule (40 mg total) by mouth daily before breakfast. 90 capsule 3  . prednisoLONE acetate (PRED FORTE) 1 % ophthalmic suspension Place 1 drop into the left eye daily.     . ramipril (ALTACE) 2.5 MG capsule Take 1 capsule (2.5 mg total) by mouth daily. Yearly physical is due must see MD for refills 90 capsule 0  . valACYclovir (VALTREX) 1000 MG tablet Take 1,000 mg by mouth daily.    . verapamil (CALAN-SR) 240 MG CR tablet TAKE 1 TABLET DAILY 90 tablet 1   No current facility-administered medications on file prior to  visit.     Past Medical History:  Diagnosis Date  . Barrett's esophagus 2008  . Carotid artery dissection ([place]) 2011   L  . Diverticulosis   . GERD (gastroesophageal reflux disease)   . Goiter   . Hemorrhoids   . Horner's syndrome 2012  . Hyperlipidemia   . Hypertension   . Personal history of colonic adenomas 01/14/2008  . Prostate cancer ([place])   . Stroke ([place]) 1996   FROM DISSECTING R CAROID ARTERY     Past Surgical History:  Procedure Laterality Date  . BIOPSY THYROID  2007   colloid  admixed with scant follicular epithelium  . CAROTID ARTERY ANGIOPLASTY  2011   L  . COLONOSCOPY W/ BIOPSIES AND POLYPECTOMY  multiple   adenomatous polyps, internal hemorrhoids, diverticulosis  . CORNEAL TRANSPLANT Left 2015    corneal pannus & perforation,[facility]  . DISSECTION OF CAROID ARTERY     WITH CVA  . KNEE SURGERY     RIGHT..TO STRIGHTEN PATELLA  . ROBOTIC PROSTATECTOMY     Dr Dutch Gray  . UPPER GASTROINTESTINAL ENDOSCOPY  multiple   w/dilation, Barrett's , esophageal ring, hiatal hernia, gastritis    Social History   Social History  . Marital status: Married    Spouse name: N/A  . Number of children: 2  . Years of education: N/A   Occupational History  . Analyst    Social History Main Topics  .  Smoking status: Former Smoker    Quit date: 01/23/2010  . Smokeless tobacco: Never Used     Comment: smoked 1974-2011, up to 1 ppd  . Alcohol use 12.6 - 16.8 oz/week    21 - 28 Cans of beer per week  . Drug use: No  . Sexual activity: Not on file   Other Topics Concern  . Not on file   Social History Narrative   Gets reg exercise    Family History  Problem Relation Age of Onset  . Diabetes Mother   . Stroke Father 59    CVA,BLOOD CLOT TO BRAIN  . CAD Father     BYPASS  . Stroke Paternal Grandmother 81    MULTIPLE CVA'S; onset late70s  . Cancer Neg Hx   . Colon cancer Neg Hx   . Esophageal cancer Neg Hx   . Rectal cancer Neg Hx   . Stomach cancer Neg Hx       Review of Systems  Constitutional: Negative for appetite change, chills, fatigue, fever and unexpected weight change.  HENT: Negative for trouble swallowing.   Eyes: Negative for visual disturbance.  Respiratory: Negative for cough, shortness of breath and wheezing.   Cardiovascular: Negative for chest pain, palpitations and leg swelling.  Gastrointestinal: Negative for abdominal pain, blood in stool, constipation, diarrhea and nausea.       No gerd  Genitourinary: Negative for dysuria and hematuria.  Musculoskeletal: Negative for arthralgias and back pain.  Skin: Negative for color change and rash.  Neurological: Negative for dizziness, light-headedness and headaches.  Psychiatric/Behavioral: Negative for dysphoric mood. The patient is not nervous/anxious.        Objective:   Vitals:   06/26/16 1408  BP: (!) 154/82  Pulse: 75  Resp: 16  Temp: 98.1 F (36.7 C)   Filed Weights   06/26/16 1408  Weight: 225 lb (102.1 kg)   Body mass index is 35.24 kg/m.  Wt Readings from Last 3 Encounters:  06/26/16 225 lb (102.1 kg)  05/30/15 232 lb (105.2 kg)  03/15/15 236 lb 6.4 oz (107.2 kg)     Physical Exam Constitutional: He appears well-developed and well-nourished. No distress.  HENT:  Head: Normocephalic and atraumatic.  Right Ear: External ear normal.  Left Ear: External ear normal.  Mouth/Throat: Oropharynx is clear and moist.  Normal ear canals and TM b/l  Eyes: Conjunctivae and EOM are normal.  Neck: Neck supple. No tracheal deviation present. No thyromegaly present.  No carotid bruit  Cardiovascular: Normal rate, regular rhythm, normal heart sounds and intact distal pulses.   No murmur heard. Pulmonary/Chest: Effort normal and breath sounds normal. No respiratory distress. He has no wheezes. He has no rales.  Abdominal: Soft. Bowel sounds are normal. He exhibits no distension. There is no tenderness.  Genitourinary:  deferred to urology Musculoskeletal: He  exhibits no edema.  Lymphadenopathy:    He has no cervical adenopathy.  Skin: Skin is warm and dry. He is not diaphoretic.  Psychiatric: He has a normal mood and affect. His behavior is normal.         Assessment & Plan:   Physical exam: Screening blood work  Reviewed - did not have complete blood work Immunizations  Pneumovax today, discussed shingles Colonoscopy  Up to date  Eye exams  Up to date  EKG  Last in 2015 - today's EKG  Exercise - regularly exercises Weight  Has lost weight - advised further weight loss Skin  - has seen  derm - had skin check recently Substance abuse  none  See Problem List for Assessment and Plan of chronic medical problems.  FU in one year

## 2016-06-26 NOTE — Patient Instructions (Addendum)
All other Health Maintenance issues reviewed.   All recommended immunizations and age-appropriate screenings are up-to-date or discussed.  Pneumovax immunization administered today.   Medications reviewed and updated.  No changes recommended at this time.  Your prescription(s) have been submitted to your pharmacy. Please take as directed and contact our office if you believe you are having problem(s) with the medication(s).  Please followup in one year    Health Maintenance, Male A healthy lifestyle and preventive care is important for your health and wellness. Ask your health care provider about what schedule of regular examinations is right for you. What should I know about weight and diet?  Eat a Healthy Diet  Eat plenty of vegetables, fruits, whole grains, low-fat dairy products, and lean protein.  Do not eat a lot of foods high in solid fats, added sugars, or salt. Maintain a Healthy Weight  Regular exercise can help you achieve or maintain a healthy weight. You should:  Do at least 150 minutes of exercise each week. The exercise should increase your heart rate and make you sweat (moderate-intensity exercise).  Do strength-training exercises at least twice a week. Watch Your Levels of Cholesterol and Blood Lipids  Have your blood tested for lipids and cholesterol every 5 years starting at 60 years of age. If you are at high risk for heart disease, you should start having your blood tested when you are 60 years old. You may need to have your cholesterol levels checked more often if:  Your lipid or cholesterol levels are high.  You are older than 61 years of age.  You are at high risk for heart disease. What should I know about cancer screening? Many types of cancers can be detected early and may often be prevented. Lung Cancer  You should be screened every year for lung cancer if:  You are a current smoker who has smoked for at least 30 years.  You are a former smoker  who has quit within the past 15 years.  Talk to your health care provider about your screening options, when you should start screening, and how often you should be screened. Colorectal Cancer  Routine colorectal cancer screening usually begins at 60 years of age and should be repeated every 5-10 years until you are 60 years old. You may need to be screened more often if early forms of precancerous polyps or small growths are found. Your health care provider may recommend screening at an earlier age if you have risk factors for colon cancer.  Your health care provider may recommend using home test kits to check for hidden blood in the stool.  A small camera at the end of a tube can be used to examine your colon (sigmoidoscopy or colonoscopy). This checks for the earliest forms of colorectal cancer. Prostate and Testicular Cancer  Depending on your age and overall health, your health care provider may do certain tests to screen for prostate and testicular cancer.  Talk to your health care provider about any symptoms or concerns you have about testicular or prostate cancer. Skin Cancer  Check your skin from head to toe regularly.  Tell your health care provider about any new moles or changes in moles, especially if:  There is a change in a mole's size, shape, or color.  You have a mole that is larger than a pencil eraser.  Always use sunscreen. Apply sunscreen liberally and repeat throughout the day.  Protect yourself by wearing long sleeves, pants, a wide-brimmed hat,  and sunglasses when outside. What should I know about heart disease, diabetes, and high blood pressure?  If you are 56-83 years of age, have your blood pressure checked every 3-5 years. If you are 62 years of age or older, have your blood pressure checked every year. You should have your blood pressure measured twice-once when you are at a hospital or clinic, and once when you are not at a hospital or clinic. Record the  average of the two measurements. To check your blood pressure when you are not at a hospital or clinic, you can use:  An automated blood pressure machine at a pharmacy.  A home blood pressure monitor.  Talk to your health care provider about your target blood pressure.  If you are between 46-59 years old, ask your health care provider if you should take aspirin to prevent heart disease.  Have regular diabetes screenings by checking your fasting blood sugar level.  If you are at a normal weight and have a low risk for diabetes, have this test once every three years after the age of 67.  If you are overweight and have a high risk for diabetes, consider being tested at a younger age or more often.  A one-time screening for abdominal aortic aneurysm (AAA) by ultrasound is recommended for men aged 66-75 years who are current or former smokers. What should I know about preventing infection? Hepatitis B  If you have a higher risk for hepatitis B, you should be screened for this virus. Talk with your health care provider to find out if you are at risk for hepatitis B infection. Hepatitis C  Blood testing is recommended for:  Everyone born from [birth year] through 1964.  Anyone with known risk factors for hepatitis C. Sexually Transmitted Diseases (STDs)  You should be screened each year for STDs including gonorrhea and chlamydia if:  You are sexually active and are younger than 60 years of age.  You are older than 60 years of age and your health care provider tells you that you are at risk for this type of infection.  Your sexual activity has changed since you were last screened and you are at an increased risk for chlamydia or gonorrhea. Ask your health care provider if you are at risk.  Talk with your health care provider about whether you are at high risk of being infected with HIV. Your health care provider may recommend a prescription medicine to help prevent HIV infection. What else can I  do?  Schedule regular health, dental, and eye exams.  Stay current with your vaccines (immunizations).  Do not use any tobacco products, such as cigarettes, chewing tobacco, and e-cigarettes. If you need help quitting, ask your health care provider.  Limit alcohol intake to no more than 2 drinks per day. One drink equals 12 ounces of beer, 5 ounces of wine, or 1 ounces of hard liquor.  Do not use street drugs.  Do not share needles.  Ask your health care provider for help if you need support or information about quitting drugs.  Tell your health care provider if you often feel depressed.  Tell your health care provider if you have ever been abused or do not feel safe at home. This information is not intended to replace advice given to you by your health care provider. Make sure you discuss any questions you have with your health care provider. Document Released: 09/07/2007 Document Revised: 11/08/2015 Document Reviewed: 12/13/2014 Elsevier Interactive Patient Education  2017  [employer].

## 2016-06-26 NOTE — Progress Notes (Signed)
Pre visit review using our clinic review tool, if applicable. No additional management support is needed unless otherwise documented below in the visit note. 

## 2016-07-03 DIAGNOSIS — Z719 Counseling, unspecified: Secondary | ICD-10-CM | POA: Diagnosis not present

## 2016-08-19 ENCOUNTER — Other Ambulatory Visit: Payer: Self-pay | Admitting: Internal Medicine

## 2016-08-21 DIAGNOSIS — Z947 Corneal transplant status: Secondary | ICD-10-CM | POA: Diagnosis not present

## 2016-08-21 DIAGNOSIS — H2513 Age-related nuclear cataract, bilateral: Secondary | ICD-10-CM | POA: Diagnosis not present

## 2016-08-27 DIAGNOSIS — C44311 Basal cell carcinoma of skin of nose: Secondary | ICD-10-CM | POA: Diagnosis not present

## 2016-08-27 DIAGNOSIS — L910 Hypertrophic scar: Secondary | ICD-10-CM | POA: Diagnosis not present

## 2016-08-27 DIAGNOSIS — D225 Melanocytic nevi of trunk: Secondary | ICD-10-CM | POA: Diagnosis not present

## 2016-08-27 DIAGNOSIS — L570 Actinic keratosis: Secondary | ICD-10-CM | POA: Diagnosis not present

## 2016-08-28 ENCOUNTER — Other Ambulatory Visit: Payer: Self-pay | Admitting: Dermatology

## 2016-08-28 DIAGNOSIS — C44311 Basal cell carcinoma of skin of nose: Secondary | ICD-10-CM | POA: Diagnosis not present

## 2016-10-28 DIAGNOSIS — C44311 Basal cell carcinoma of skin of nose: Secondary | ICD-10-CM | POA: Diagnosis not present

## 2016-10-30 DIAGNOSIS — C61 Malignant neoplasm of prostate: Secondary | ICD-10-CM | POA: Diagnosis not present

## 2017-01-28 ENCOUNTER — Ambulatory Visit (INDEPENDENT_AMBULATORY_CARE_PROVIDER_SITE_OTHER): Payer: 59 | Admitting: Internal Medicine

## 2017-01-28 ENCOUNTER — Encounter: Payer: Self-pay | Admitting: Internal Medicine

## 2017-01-28 VITALS — BP 144/82 | HR 67 | Temp 98.2°F | Resp 16 | Wt 230.0 lb

## 2017-01-28 DIAGNOSIS — B029 Zoster without complications: Secondary | ICD-10-CM | POA: Diagnosis not present

## 2017-01-28 NOTE — Assessment & Plan Note (Signed)
He has a rash on the left side of his face that is concerning for likely shingles.  There are no discrete blisters, but there are features to suggest shingles He is currently taking Valtrex 1000 mg daily I have asked him to increase this to twice daily for 7 days Because of the location of the rash I have asked him to see his eye doctor today or tomorrow Pain is tolerable-okay to take Advil or Tylenol as needed Advised him to call if his pain increases

## 2017-01-28 NOTE — Patient Instructions (Addendum)
Increase valtrex to twice daily for one week. Then go back to once a day.  Call your eye doctor and see him today or tomorrow.    Shingles Shingles, which is also known as herpes zoster, is an infection that causes a painful skin rash and fluid-filled blisters. Shingles is not related to genital herpes, which is a sexually transmitted infection. Shingles only develops in people who:  Have had chickenpox.  Have received the chickenpox vaccine. (This is rare.)  What are the causes? Shingles is caused by varicella-zoster virus (VZV). This is the same virus that causes chickenpox. After exposure to VZV, the virus stays in the body in an inactive (dormant) state. Shingles develops if the virus reactivates. This can happen many years after the initial exposure to VZV. It is not known what causes this virus to reactivate. What increases the risk? People who have had chickenpox or received the chickenpox vaccine are at risk for shingles. Infection is more common in people who:  Are older than age 60.  Have a weakened defense (immune) system, such as those with HIV, AIDS, or cancer.  Are taking medicines that weaken the immune system, such as transplant medicines.  Are under great stress.  What are the signs or symptoms? Early symptoms of this condition include itching, tingling, and pain in an area on your skin. Pain may be described as burning, stabbing, or throbbing. A few days or weeks after symptoms start, a painful red rash appears, usually on one side of the body in a bandlike or beltlike pattern. The rash eventually turns into fluid-filled blisters that break open, scab over, and dry up in about 2-3 weeks. At any time during the infection, you may also develop:  A fever.  Chills.  A headache.  An upset stomach.  How is this diagnosed? This condition is diagnosed with a skin exam. Sometimes, skin or fluid samples are taken from the blisters before a diagnosis is made. These samples  are examined under a microscope or sent to a lab for testing. How is this treated? There is no specific cure for this condition. Your health care provider will probably prescribe medicines to help you manage pain, recover more quickly, and avoid long-term problems. Medicines may include:  Antiviral drugs.  Anti-inflammatory drugs.  Pain medicines.  If the area involved is on your face, you may be referred to a specialist, such as an eye doctor (ophthalmologist) or an ear, nose, and throat (ENT) doctor to help you avoid eye problems, chronic pain, or disability. Follow these instructions at home: Medicines  Take medicines only as directed by your health care provider.  Apply an anti-itch or numbing cream to the affected area as directed by your health care provider. Blister and Rash Care  Take a cool bath or apply cool compresses to the area of the rash or blisters as directed by your health care provider. This may help with pain and itching.  Keep your rash covered with a loose bandage (dressing). Wear loose-fitting clothing to help ease the pain of material rubbing against the rash.  Keep your rash and blisters clean with mild soap and cool water or as directed by your health care provider.  Check your rash every day for signs of infection. These include redness, swelling, and pain that lasts or increases.  Do not pick your blisters.  Do not scratch your rash. General instructions  Rest as directed by your health care provider.  Keep all follow-up visits as directed  by your health care provider. This is important.  Until your blisters scab over, your infection can cause chickenpox in people who have never had it or been vaccinated against it. To prevent this from happening, avoid contact with other people, especially: ? Babies. ? Pregnant women. ? Children who have eczema. ? Elderly people who have transplants. ? People who have chronic illnesses, such as leukemia or  AIDS. Contact a health care provider if:  Your pain is not relieved with prescribed medicines.  Your pain does not get better after the rash heals.  Your rash looks infected. Signs of infection include redness, swelling, and pain that lasts or increases. Get help right away if:  The rash is on your face or nose.  You have facial pain, pain around your eye area, or loss of feeling on one side of your face.  You have ear pain or you have ringing in your ear.  You have loss of taste.  Your condition gets worse. This information is not intended to replace advice given to you by your health care provider. Make sure you discuss any questions you have with your health care provider. Document Released: 03/11/2005 Document Revised: 11/05/2015 Document Reviewed: 01/20/2014 Elsevier Interactive Patient Education  2017 [employer].

## 2017-01-28 NOTE — Progress Notes (Signed)
Subjective:    Patient ID: Eric Dillon, male    DOB: 1955/04/12, 61 y.o.   MRN: [id number]  HPI He is here for an acute visit.   Last Thursday he started to have pain and lumps in the back of his head on the left Side.  It spread to the left side of his face.  It does not involve the right side of his face or neck.  The pain and lumps in the back of his left hand have resolved.  There is a slight warmth to the rash.  It is an uncomfortable type pain, but more of a sharp pain if he touches it.  He denies any itching or burning sensation.  He does have a tingling sensation on occasion.  He denies any fevers or chills.  He denies being more fatigued than usual.  He does note some irritation in his left eye.  He denies any eye pain, photosensitivity or discharge from the eye.  He does feel like the rash is improving.  He denies any new products, medications or any known causes.  He does take 1000 mg of Valtrex daily.  Medications and allergies reviewed with patient and updated if appropriate.  Patient Active Problem List   Diagnosis Date Noted  . Diabetes ([place]) 06/08/2015  . H/O cornea transplant, left 05/30/2015  . Psoriasis 12/08/2014  . Nonspecific abnormal electrocardiogram (ECG) (EKG) 03/23/2014  . 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 Dissection of carotid artery ([place]) 01/19/2010  . 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 BARRETTS ESOPHAGUS - not by 2015 criteria 12/06/2009  . PROSTATE CANCER, HX OF 12/28/2008  . Personal history of colonic adenomas 01/14/2008  . Nontoxic multinodular goiter 03/03/2007  . HYPERLIPIDEMIA 03/03/2007  . Essential hypertension 03/03/2007  . GERD with stricture 03/03/2007  . CEREBROVASCULAR ACCIDENT, HX OF 03/03/2007    Current Outpatient Medications on File Prior to Visit  Medication Sig Dispense Refill  . aspirin 325 MG buffered tablet Take 325 mg by mouth daily. TAKE WITH FOOD     . atorvastatin (LIPITOR) 40 MG tablet Take 1 tablet (40  mg total) by mouth daily. 90 tablet 3  . esomeprazole (NEXIUM) 40 MG capsule Take 1 capsule (40 mg total) by mouth daily before breakfast. 90 capsule 3  . prednisoLONE acetate (PRED FORTE) 1 % ophthalmic suspension Place 1 drop into the left eye daily.     . ramipril (ALTACE) 2.5 MG capsule Take 1 capsule (2.5 mg total) by mouth daily. 90 capsule 3  . valACYclovir (VALTREX) 1000 MG tablet Take 1,000 mg by mouth daily.    . verapamil (CALAN-SR) 240 MG CR tablet TAKE 1 TABLET DAILY 90 tablet 3   No current facility-administered medications on file prior to visit.     Past Medical History:  Diagnosis Date  . Barrett's esophagus 2008  . Carotid artery dissection ([place]) 2011   L  . Diverticulosis   . GERD (gastroesophageal reflux disease)   . Goiter   . Hemorrhoids   . Horner's syndrome 2012  . Hyperlipidemia   . Hypertension   . Personal history of colonic adenomas 01/14/2008  . Prostate cancer ([place])   . Stroke ([place]) 1996   FROM DISSECTING R CAROID ARTERY     Past Surgical History:  Procedure Laterality Date  . BIOPSY THYROID  2007   colloid  admixed with scant follicular epithelium  . CAROTID ARTERY ANGIOPLASTY  2011   L  . COLONOSCOPY W/ BIOPSIES AND  POLYPECTOMY  multiple   adenomatous polyps, internal hemorrhoids, diverticulosis  . CORNEAL TRANSPLANT Left 2015    corneal pannus & perforation,[facility]  . DISSECTION OF CAROID ARTERY     WITH CVA  . KNEE SURGERY     RIGHT..TO STRIGHTEN PATELLA  . ROBOTIC PROSTATECTOMY     Dr Dutch Gray  . UPPER GASTROINTESTINAL ENDOSCOPY  multiple   w/dilation, Barrett's , esophageal ring, hiatal hernia, gastritis    Social History   Socioeconomic History  . Marital status: Married    Spouse name: None  . Number of children: 2  . Years of education: None  . Highest education level: None  Social Needs  . Financial resource strain: None  . Food insecurity - worry: None  . Food insecurity - inability: None  . Transportation needs -  medical: None  . Transportation needs - non-medical: None  Occupational History  . Occupation: Analyst  Tobacco Use  . Smoking status: Former Smoker    Last attempt to quit: 01/23/2010    Years since quitting: 7.0  . Smokeless tobacco: Never Used  . Tobacco comment: smoked 1974-2011, up to 1 ppd  Substance and Sexual Activity  . Alcohol use: Yes    Alcohol/week: 12.6 - 16.8 oz    Types: 21 - 28 Cans of beer per week  . Drug use: No  . Sexual activity: None  Other Topics Concern  . None  Social History Narrative   Gets reg exercise    Family History  Problem Relation Age of Onset  . Diabetes Mother   . Stroke Father 43       CVA,BLOOD CLOT TO BRAIN  . CAD Father        BYPASS  . Stroke Paternal Grandmother 81       MULTIPLE CVA'S; onset late70s  . Cancer Neg Hx   . Colon cancer Neg Hx   . Esophageal cancer Neg Hx   . Rectal cancer Neg Hx   . Stomach cancer Neg Hx     Review of Systems  Constitutional: Negative for chills, fatigue and fever.  Eyes: Negative for photophobia, discharge, redness and visual disturbance.       Left eye feel irritated  Skin: Positive for rash.  Neurological: Negative for dizziness, light-headedness and headaches.       Objective:   Vitals:   01/28/17 1507  BP: (!) 144/82  Pulse: 67  Resp: 16  Temp: 98.2 F (36.8 C)  SpO2: 98%   Filed Weights   01/28/17 1507  Weight: 230 lb (104.3 kg)   Body mass index is 36.02 kg/m.  Wt Readings from Last 3 Encounters:  01/28/17 230 lb (104.3 kg)  06/26/16 225 lb (102.1 kg)  05/30/15 232 lb (105.2 kg)     Physical Exam  Constitutional: He appears well-nourished. No distress.  HENT:  Head: Normocephalic and atraumatic.  Eyes: Conjunctivae and EOM are normal. Pupils are equal, round, and reactive to light. Right eye exhibits no discharge. Left eye exhibits no discharge. No scleral icterus.  Skin: He is not diaphoretic.  Erythematous slightly raised blotchy rash on left side of face  that extends up to forehead, no discrete blisters or papules.  No open wounds.  No discharge.  Slightly warm to touch.  Mildly tender with palpation.  Left posterior head without rash or blisters.          Assessment & Plan:   See Problem List for Assessment and Plan of chronic medical problems.

## 2017-01-29 DIAGNOSIS — H2513 Age-related nuclear cataract, bilateral: Secondary | ICD-10-CM | POA: Diagnosis not present

## 2017-01-29 DIAGNOSIS — Z947 Corneal transplant status: Secondary | ICD-10-CM | POA: Diagnosis not present

## 2017-02-05 DIAGNOSIS — H2513 Age-related nuclear cataract, bilateral: Secondary | ICD-10-CM | POA: Diagnosis not present

## 2017-02-05 DIAGNOSIS — Z947 Corneal transplant status: Secondary | ICD-10-CM | POA: Diagnosis not present

## 2017-04-16 DIAGNOSIS — H524 Presbyopia: Secondary | ICD-10-CM | POA: Diagnosis not present

## 2017-04-16 DIAGNOSIS — H52203 Unspecified astigmatism, bilateral: Secondary | ICD-10-CM | POA: Diagnosis not present

## 2017-08-06 DIAGNOSIS — L821 Other seborrheic keratosis: Secondary | ICD-10-CM | POA: Diagnosis not present

## 2017-08-06 DIAGNOSIS — D1801 Hemangioma of skin and subcutaneous tissue: Secondary | ICD-10-CM | POA: Diagnosis not present

## 2017-08-06 DIAGNOSIS — L814 Other melanin hyperpigmentation: Secondary | ICD-10-CM | POA: Diagnosis not present

## 2017-08-06 DIAGNOSIS — L570 Actinic keratosis: Secondary | ICD-10-CM | POA: Diagnosis not present

## 2017-08-29 ENCOUNTER — Other Ambulatory Visit: Payer: Self-pay | Admitting: Internal Medicine

## 2017-09-09 ENCOUNTER — Encounter: Payer: Self-pay | Admitting: Internal Medicine

## 2017-09-09 ENCOUNTER — Other Ambulatory Visit (INDEPENDENT_AMBULATORY_CARE_PROVIDER_SITE_OTHER): Payer: 59

## 2017-09-09 ENCOUNTER — Ambulatory Visit (INDEPENDENT_AMBULATORY_CARE_PROVIDER_SITE_OTHER): Payer: 59 | Admitting: Internal Medicine

## 2017-09-09 VITALS — BP 146/86 | HR 73 | Temp 98.5°F | Resp 16 | Ht 67.0 in | Wt 225.0 lb

## 2017-09-09 DIAGNOSIS — C4490 Unspecified malignant neoplasm of skin, unspecified: Secondary | ICD-10-CM

## 2017-09-09 DIAGNOSIS — E119 Type 2 diabetes mellitus without complications: Secondary | ICD-10-CM

## 2017-09-09 DIAGNOSIS — E042 Nontoxic multinodular goiter: Secondary | ICD-10-CM | POA: Diagnosis not present

## 2017-09-09 DIAGNOSIS — E782 Mixed hyperlipidemia: Secondary | ICD-10-CM

## 2017-09-09 DIAGNOSIS — Z0000 Encounter for general adult medical examination without abnormal findings: Secondary | ICD-10-CM | POA: Diagnosis not present

## 2017-09-09 DIAGNOSIS — I10 Essential (primary) hypertension: Secondary | ICD-10-CM

## 2017-09-09 DIAGNOSIS — K222 Esophageal obstruction: Secondary | ICD-10-CM

## 2017-09-09 DIAGNOSIS — K219 Gastro-esophageal reflux disease without esophagitis: Secondary | ICD-10-CM

## 2017-09-09 DIAGNOSIS — R600 Localized edema: Secondary | ICD-10-CM | POA: Insufficient documentation

## 2017-09-09 DIAGNOSIS — Z8546 Personal history of malignant neoplasm of prostate: Secondary | ICD-10-CM | POA: Diagnosis not present

## 2017-09-09 DIAGNOSIS — Z85828 Personal history of other malignant neoplasm of skin: Secondary | ICD-10-CM | POA: Insufficient documentation

## 2017-09-09 LAB — CBC WITH DIFFERENTIAL/PLATELET
BASOS PCT: 1 % (ref 0.0–3.0)
Basophils Absolute: 0.1 10*3/uL (ref 0.0–0.1)
Eosinophils Absolute: 0.1 10*3/uL (ref 0.0–0.7)
Eosinophils Relative: 1 % (ref 0.0–5.0)
HEMATOCRIT: 43.6 % (ref 39.0–52.0)
HEMOGLOBIN: 15 g/dL (ref 13.0–17.0)
Lymphocytes Relative: 9.2 % — ABNORMAL LOW (ref 12.0–46.0)
Lymphs Abs: 1 10*3/uL (ref 0.7–4.0)
MCHC: 34.4 g/dL (ref 30.0–36.0)
MCV: 102.2 fl — ABNORMAL HIGH (ref 78.0–100.0)
MONO ABS: 0.8 10*3/uL (ref 0.1–1.0)
Monocytes Relative: 7.6 % (ref 3.0–12.0)
NEUTROS ABS: 8.5 10*3/uL — AB (ref 1.4–7.7)
Neutrophils Relative %: 81.2 % — ABNORMAL HIGH (ref 43.0–77.0)
Platelets: 308 10*3/uL (ref 150.0–400.0)
RBC: 4.27 Mil/uL (ref 4.22–5.81)
RDW: 12.9 % (ref 11.5–15.5)
WBC: 10.4 10*3/uL (ref 4.0–10.5)

## 2017-09-09 LAB — LIPID PANEL
Cholesterol: 179 mg/dL (ref 0–200)
HDL: 70.6 mg/dL (ref >39.00)
LDL CALC: 69 mg/dL (ref 0–99)
NONHDL: 108.72
Total CHOL/HDL Ratio: 3
Triglycerides: 198 mg/dL — ABNORMAL HIGH (ref 0.0–149.0)
VLDL: 39.6 mg/dL (ref 0.0–40.0)

## 2017-09-09 LAB — COMPREHENSIVE METABOLIC PANEL
ALBUMIN: 4.3 g/dL (ref 3.5–5.2)
ALT: 46 U/L (ref 0–53)
AST: 22 U/L (ref 0–37)
Alkaline Phosphatase: 70 U/L (ref 39–117)
BUN: 17 mg/dL (ref 6–23)
CHLORIDE: 101 meq/L (ref 96–112)
CO2: 27 meq/L (ref 19–32)
Calcium: 9.7 mg/dL (ref 8.4–10.5)
Creatinine, Ser: 0.85 mg/dL (ref 0.40–1.50)
GFR: 97.47 mL/min (ref >60.00)
Glucose, Bld: 157 mg/dL — ABNORMAL HIGH (ref 70–99)
POTASSIUM: 4.2 meq/L (ref 3.5–5.1)
Sodium: 136 mEq/L (ref 135–145)
Total Bilirubin: 0.4 mg/dL (ref 0.2–1.2)
Total Protein: 7.1 g/dL (ref 6.0–8.3)

## 2017-09-09 LAB — TSH: TSH: 0.17 u[IU]/mL — AB (ref 0.35–4.50)

## 2017-09-09 LAB — HEMOGLOBIN A1C: Hgb A1c MFr Bld: 6.2 % (ref 4.6–6.5)

## 2017-09-09 MED ORDER — ATORVASTATIN CALCIUM 40 MG PO TABS: 40.0000 mg | ORAL_TABLET | Freq: Every day | ORAL | 1 refills | Status: DC

## 2017-09-09 MED ORDER — VERAPAMIL HCL ER 240 MG PO TBCR: 240.0000 mg | EXTENDED_RELEASE_TABLET | Freq: Every day | ORAL | 1 refills | Status: DC

## 2017-09-09 MED ORDER — RAMIPRIL 2.5 MG PO CAPS: 2.5000 mg | ORAL_CAPSULE | Freq: Every day | ORAL | 1 refills | Status: DC

## 2017-09-09 NOTE — Progress Notes (Signed)
Subjective:    Patient ID: Eric Dillon, male    DOB: 02-19-1956, 62 y.o.   MRN: [id number]  HPI He is here for a physical exam.   Blood pressure at home is 125-135/80's.  He is currently not exercising regularly because his left knee has been hurting.  He plans on seeing his orthopedic.  Prior to that he was walking on the treadmill.  He feels he is eating fairly well.  He denies any changes in his health he has no major concerns.  Medications and allergies reviewed with patient and updated if appropriate.  Patient Active Problem List   Diagnosis Date Noted  . Skin cancer 09/09/2017  . Herpes zoster without complication 69/62/9528  . Diabetes ([place]) 06/08/2015  . H/O cornea transplant, left 05/30/2015  . Psoriasis 12/08/2014  . Nonspecific abnormal electrocardiogram (ECG) (EKG) 03/23/2014  . 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 Dissection of carotid artery ([place]) 01/19/2010  . 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 BARRETTS ESOPHAGUS - not by 2015 criteria 12/06/2009  . PROSTATE CANCER, HX OF 12/28/2008  . Personal history of colonic adenomas 01/14/2008  . Nontoxic multinodular goiter 03/03/2007  . HYPERLIPIDEMIA 03/03/2007  . Essential hypertension 03/03/2007  . GERD with stricture 03/03/2007  . CEREBROVASCULAR ACCIDENT, HX OF 03/03/2007    Current Outpatient Medications on File Prior to Visit  Medication Sig Dispense Refill  . aspirin 325 MG buffered tablet Take 325 mg by mouth daily. TAKE WITH FOOD     . esomeprazole (NEXIUM) 40 MG capsule Take 1 capsule (40 mg total) by mouth daily before breakfast. 90 capsule 3  . valACYclovir (VALTREX) 1000 MG tablet Take 1,000 mg by mouth daily.     No current facility-administered medications on file prior to visit.     Past Medical History:  Diagnosis Date  . Barrett's esophagus 2008  . Carotid artery dissection ([place]) 2011   L  . Diverticulosis   . GERD (gastroesophageal reflux disease)   . Goiter   . Hemorrhoids    . Horner's syndrome 2012  . Hyperlipidemia   . Hypertension   . Personal history of colonic adenomas 01/14/2008  . Prostate cancer ([place])   . Stroke ([place]) 1996   FROM DISSECTING R CAROID ARTERY     Past Surgical History:  Procedure Laterality Date  . BIOPSY THYROID  2007   colloid  admixed with scant follicular epithelium  . CAROTID ARTERY ANGIOPLASTY  2011   L  . COLONOSCOPY W/ BIOPSIES AND POLYPECTOMY  multiple   adenomatous polyps, internal hemorrhoids, diverticulosis  . CORNEAL TRANSPLANT Left 2015    corneal pannus & perforation,[facility]  . DISSECTION OF CAROID ARTERY     WITH CVA  . KNEE SURGERY     RIGHT..TO STRIGHTEN PATELLA  . ROBOTIC PROSTATECTOMY     Dr Dutch Gray  . UPPER GASTROINTESTINAL ENDOSCOPY  multiple   w/dilation, Barrett's , esophageal ring, hiatal hernia, gastritis    Social History   Socioeconomic History  . Marital status: Married    Spouse name: Not on file  . Number of children: 2  . Years of education: Not on file  . Highest education level: Not on file  Occupational History  . Occupation: Insurance underwriter  . Financial resource strain: Not on file  . Food insecurity:    Worry: Not on file    Inability: Not on file  . Transportation needs:    Medical: Not on file    Non-medical:  Not on file  Tobacco Use  . Smoking status: Former Smoker    Last attempt to quit: 01/23/2010    Years since quitting: 7.6  . Smokeless tobacco: Never Used  . Tobacco comment: smoked 1974-2011, up to 1 ppd  Substance and Sexual Activity  . Alcohol use: Yes    Alcohol/week: 12.6 - 16.8 oz    Types: 21 - 28 Cans of beer per week  . Drug use: No  . Sexual activity: Not on file  Lifestyle  . Physical activity:    Days per week: Not on file    Minutes per session: Not on file  . Stress: Not on file  Relationships  . Social connections:    Talks on phone: Not on file    Gets together: Not on file    Attends religious service: Not on file    Active  member of club or organization: Not on file    Attends meetings of clubs or organizations: Not on file    Relationship status: Not on file  Other Topics Concern  . Not on file  Social History Narrative   Gets reg exercise    Family History  Problem Relation Age of Onset  . Diabetes Mother   . Stroke Father 61       CVA,BLOOD CLOT TO BRAIN  . CAD Father        BYPASS  . Stroke Paternal Grandmother 81       MULTIPLE CVA'S; onset late70s  . Cancer Neg Hx   . Colon cancer Neg Hx   . Esophageal cancer Neg Hx   . Rectal cancer Neg Hx   . Stomach cancer Neg Hx     Review of Systems  Constitutional: Negative for chills and fever.  Eyes: Negative for visual disturbance.  Respiratory: Negative for cough, shortness of breath and wheezing.   Cardiovascular: Positive for leg swelling (left ankle - knee OA). Negative for chest pain and palpitations.  Gastrointestinal: Negative for abdominal pain, blood in stool, constipation, diarrhea and nausea.  Genitourinary: Negative for dysuria and hematuria.  Musculoskeletal: Positive for arthralgias (left knee) and back pain (sciatica). Negative for myalgias.  Skin: Negative for color change and rash.  Neurological: Negative for dizziness, light-headedness, numbness and headaches.  Psychiatric/Behavioral: Negative for dysphoric mood and sleep disturbance. The patient is not nervous/anxious.        Objective:   Vitals:   09/09/17 1458  BP: (!) 146/86  Pulse: 73  Resp: 16  Temp: 98.5 F (36.9 C)  SpO2: 97%   Filed Weights   09/09/17 1458  Weight: 225 lb (102.1 kg)   Body mass index is 35.24 kg/m.  BP Readings from Last 3 Encounters:  09/09/17 (!) 146/86  01/28/17 (!) 144/82  06/26/16 (!) 154/82    Wt Readings from Last 3 Encounters:  09/09/17 225 lb (102.1 kg)  01/28/17 230 lb (104.3 kg)  06/26/16 225 lb (102.1 kg)     Physical Exam Constitutional: He appears well-developed and well-nourished. No distress.  HENT:  Head:  Normocephalic and atraumatic.  Right Ear: External ear normal.  Left Ear: External ear normal.  Mouth/Throat: Oropharynx is clear and moist.  Normal ear canals and TM b/l  Eyes: Conjunctivae and EOM are normal.  Neck: Neck supple. No tracheal deviation present. No thyromegaly present.  No carotid bruit  Cardiovascular: Normal rate, regular rhythm, normal heart sounds and intact distal pulses.  No murmur heard.  Trace bilateral lower extremity edema-left ankle slightly  more than right Pulmonary/Chest: Effort normal and breath sounds normal. No respiratory distress. He has no wheezes. He has no rales.  Abdominal: Soft. He exhibits no distension. There is no tenderness.  Genitourinary: deferred to urology  Lymphadenopathy:   He has no cervical adenopathy.  Skin: Skin is warm and dry. He is not diaphoretic.  Psychiatric: He has a normal mood and affect. His behavior is normal.   Diabetic Foot Exam - Simple   Simple Foot Form Diabetic Foot exam was performed with the following findings:  Yes 09/09/2017  5:01 PM  Visual Inspection No deformities, no ulcerations, no other skin breakdown bilaterally:  Yes Sensation Testing Intact to touch and monofilament testing bilaterally:  Yes Pulse Check Posterior Tibialis and Dorsalis pulse intact bilaterally:  Yes Comments          Assessment & Plan:   Physical exam: Screening blood work  ordered Immunizations   Discussed shingrix, others up to date Colonoscopy   Due this month - 09/16/17, last done 2014 Eye exams  Up to date  - dr Tressie Ellis EKG     Done 06/2016 Exercise  None - due to left knee pain Weight   encouraged weight loss Skin   no concerns Substance abuse none  See Problem List for Assessment and Plan of chronic medical problems.  Follow-up in 6 months-diabetes

## 2017-09-09 NOTE — Assessment & Plan Note (Signed)
Check A1c Foot exam normal Eye exam up-to-date-normal Continue diabetic diet Start exercising when able Ideally work on weight loss

## 2017-09-09 NOTE — Assessment & Plan Note (Signed)
Blood pressure better controlled at home, but slightly elevated here He will continue to monitor-discussed goal blood pressure Continue current medication CMP, TSH

## 2017-09-09 NOTE — Assessment & Plan Note (Signed)
Sees derm annually 

## 2017-09-09 NOTE — Assessment & Plan Note (Signed)
Following with urology

## 2017-09-09 NOTE — Assessment & Plan Note (Signed)
Check TSH 

## 2017-09-09 NOTE — Assessment & Plan Note (Signed)
GERD controlled with over-the-counter Nexium Continue daily medication

## 2017-09-09 NOTE — Patient Instructions (Addendum)
Test(s) ordered today. Your results will be released to MyChart (or called to you) after review, usually within 72hours after test completion. If any changes need to be made, you will be notified at that same time.  All other Health Maintenance issues reviewed.   All recommended immunizations and age-appropriate screenings are up-to-date or discussed.  No immunizations administered today.   Medications reviewed and updated.  No changes recommended at this time.  Your prescription(s) have been submitted to your pharmacy. Please take as directed and contact our office if you believe you are having problem(s) with the medication(s).   Please followup in 6 months    Health Maintenance, Male A healthy lifestyle and preventive care is important for your health and wellness. Ask your health care provider about what schedule of regular examinations is right for you. What should I know about weight and diet? Eat a Healthy Diet  Eat plenty of vegetables, fruits, whole grains, low-fat dairy products, and lean protein.  Do not eat a lot of foods high in solid fats, added sugars, or salt.  Maintain a Healthy Weight Regular exercise can help you achieve or maintain a healthy weight. You should:  Do at least 150 minutes of exercise each week. The exercise should increase your heart rate and make you sweat (moderate-intensity exercise).  Do strength-training exercises at least twice a week.  Watch Your Levels of Cholesterol and Blood Lipids  Have your blood tested for lipids and cholesterol every 5 years starting at 61 years of age. If you are at high risk for heart disease, you should start having your blood tested when you are 61 years old. You may need to have your cholesterol levels checked more often if: ? Your lipid or cholesterol levels are high. ? You are older than 61 years of age. ? You are at high risk for heart disease.  What should I know about cancer screening? Many types of  cancers can be detected early and may often be prevented. Lung Cancer  You should be screened every year for lung cancer if: ? You are a current smoker who has smoked for at least 30 years. ? You are a former smoker who has quit within the past 15 years.  Talk to your health care provider about your screening options, when you should start screening, and how often you should be screened.  Colorectal Cancer  Routine colorectal cancer screening usually begins at 61 years of age and should be repeated every 5-10 years until you are 61 years old. You may need to be screened more often if early forms of precancerous polyps or small growths are found. Your health care provider may recommend screening at an earlier age if you have risk factors for colon cancer.  Your health care provider may recommend using home test kits to check for hidden blood in the stool.  A small camera at the end of a tube can be used to examine your colon (sigmoidoscopy or colonoscopy). This checks for the earliest forms of colorectal cancer.  Prostate and Testicular Cancer  Depending on your age and overall health, your health care provider may do certain tests to screen for prostate and testicular cancer.  Talk to your health care provider about any symptoms or concerns you have about testicular or prostate cancer.  Skin Cancer  Check your skin from head to toe regularly.  Tell your health care provider about any new moles or changes in moles, especially if: ? There is a   change in a mole's size, shape, or color. ? You have a mole that is larger than a pencil eraser.  Always use sunscreen. Apply sunscreen liberally and repeat throughout the day.  Protect yourself by wearing long sleeves, pants, a wide-brimmed hat, and sunglasses when outside.  What should I know about heart disease, diabetes, and high blood pressure?  If you are 18-39 years of age, have your blood pressure checked every 3-5 years. If you are  40 years of age or older, have your blood pressure checked every year. You should have your blood pressure measured twice-once when you are at a hospital or clinic, and once when you are not at a hospital or clinic. Record the average of the two measurements. To check your blood pressure when you are not at a hospital or clinic, you can use: ? An automated blood pressure machine at a pharmacy. ? A home blood pressure monitor.  Talk to your health care provider about your target blood pressure.  If you are between 45-79 years old, ask your health care provider if you should take aspirin to prevent heart disease.  Have regular diabetes screenings by checking your fasting blood sugar level. ? If you are at a normal weight and have a low risk for diabetes, have this test once every three years after the age of 45. ? If you are overweight and have a high risk for diabetes, consider being tested at a younger age or more often.  A one-time screening for abdominal aortic aneurysm (AAA) by ultrasound is recommended for men aged 65-75 years who are current or former smokers. What should I know about preventing infection? Hepatitis B If you have a higher risk for hepatitis B, you should be screened for this virus. Talk with your health care provider to find out if you are at risk for hepatitis B infection. Hepatitis C Blood testing is recommended for:  Everyone born from 1945 through 1965.  Anyone with known risk factors for hepatitis C.  Sexually Transmitted Diseases (STDs)  You should be screened each year for STDs including gonorrhea and chlamydia if: ? You are sexually active and are younger than 61 years of age. ? You are older than 61 years of age and your health care provider tells you that you are at risk for this type of infection. ? Your sexual activity has changed since you were last screened and you are at an increased risk for chlamydia or gonorrhea. Ask your health care provider if you  are at risk.  Talk with your health care provider about whether you are at high risk of being infected with HIV. Your health care provider may recommend a prescription medicine to help prevent HIV infection.  What else can I do?  Schedule regular health, dental, and eye exams.  Stay current with your vaccines (immunizations).  Do not use any tobacco products, such as cigarettes, chewing tobacco, and e-cigarettes. If you need help quitting, ask your health care provider.  Limit alcohol intake to no more than 2 drinks per day. One drink equals 12 ounces of beer, 5 ounces of wine, or 1 ounces of hard liquor.  Do not use street drugs.  Do not share needles.  Ask your health care provider for help if you need support or information about quitting drugs.  Tell your health care provider if you often feel depressed.  Tell your health care provider if you have ever been abused or do not feel safe at home.   This information is not intended to replace advice given to you by your health care provider. Make sure you discuss any questions you have with your health care provider. Document Released: 09/07/2007 Document Revised: 11/08/2015 Document Reviewed: 12/13/2014 Elsevier Interactive Patient Education  2018 [employer]  

## 2017-09-09 NOTE — Assessment & Plan Note (Signed)
Check lipid panel  Continue daily statin Regular exercise and healthy diet encouraged  

## 2017-09-09 NOTE — Assessment & Plan Note (Signed)
Mild on exam-left ankle slightly more, likely related to left knee arthritis Monitor-no change in treatment as needed Once able start regular exercise and work on weight loss

## 2017-09-11 ENCOUNTER — Encounter: Payer: Self-pay | Admitting: Internal Medicine

## 2017-09-11 ENCOUNTER — Other Ambulatory Visit: Payer: Self-pay | Admitting: Internal Medicine

## 2017-09-11 DIAGNOSIS — R7989 Other specified abnormal findings of blood chemistry: Secondary | ICD-10-CM

## 2017-10-29 ENCOUNTER — Ambulatory Visit (INDEPENDENT_AMBULATORY_CARE_PROVIDER_SITE_OTHER): Payer: 59 | Admitting: *Deleted

## 2017-10-29 DIAGNOSIS — Z23 Encounter for immunization: Secondary | ICD-10-CM | POA: Diagnosis not present

## 2017-10-29 DIAGNOSIS — Z8546 Personal history of malignant neoplasm of prostate: Secondary | ICD-10-CM | POA: Diagnosis not present

## 2017-11-05 ENCOUNTER — Other Ambulatory Visit (INDEPENDENT_AMBULATORY_CARE_PROVIDER_SITE_OTHER): Payer: 59

## 2017-11-05 DIAGNOSIS — R7989 Other specified abnormal findings of blood chemistry: Secondary | ICD-10-CM | POA: Diagnosis not present

## 2017-11-05 LAB — TSH: TSH: 0.23 u[IU]/mL — ABNORMAL LOW (ref 0.35–4.50)

## 2017-11-05 LAB — T4, FREE: Free T4: 0.98 ng/dL (ref 0.60–1.60)

## 2017-11-05 LAB — T3, FREE: T3, Free: 3.3 pg/mL (ref 2.3–4.2)

## 2017-11-06 LAB — THYROID ANTIBODIES: Thyroglobulin Ab: <1 IU/mL (ref <=1)

## 2017-11-08 ENCOUNTER — Encounter: Payer: Self-pay | Admitting: Internal Medicine

## 2017-11-08 DIAGNOSIS — R7989 Other specified abnormal findings of blood chemistry: Secondary | ICD-10-CM | POA: Insufficient documentation

## 2017-11-10 ENCOUNTER — Encounter: Payer: Self-pay | Admitting: Internal Medicine

## 2017-11-12 DIAGNOSIS — H2513 Age-related nuclear cataract, bilateral: Secondary | ICD-10-CM | POA: Diagnosis not present

## 2017-11-12 DIAGNOSIS — Z947 Corneal transplant status: Secondary | ICD-10-CM | POA: Diagnosis not present

## 2017-12-15 DIAGNOSIS — H52203 Unspecified astigmatism, bilateral: Secondary | ICD-10-CM | POA: Diagnosis not present

## 2017-12-15 DIAGNOSIS — H5213 Myopia, bilateral: Secondary | ICD-10-CM | POA: Diagnosis not present

## 2017-12-15 DIAGNOSIS — H524 Presbyopia: Secondary | ICD-10-CM | POA: Diagnosis not present

## 2017-12-19 ENCOUNTER — Encounter: Payer: Self-pay | Admitting: Internal Medicine

## 2017-12-29 ENCOUNTER — Ambulatory Visit (INDEPENDENT_AMBULATORY_CARE_PROVIDER_SITE_OTHER): Payer: 59 | Admitting: *Deleted

## 2017-12-29 DIAGNOSIS — Z23 Encounter for immunization: Secondary | ICD-10-CM | POA: Diagnosis not present

## 2018-01-23 ENCOUNTER — Ambulatory Visit (AMBULATORY_SURGERY_CENTER): Payer: Self-pay

## 2018-01-23 ENCOUNTER — Encounter: Payer: Self-pay | Admitting: Internal Medicine

## 2018-01-23 VITALS — Ht 67.0 in | Wt 226.4 lb

## 2018-01-23 DIAGNOSIS — K2270 Barrett's esophagus without dysplasia: Secondary | ICD-10-CM

## 2018-01-23 DIAGNOSIS — Z86010 Personal history of colonic polyps: Secondary | ICD-10-CM

## 2018-01-23 NOTE — Progress Notes (Signed)
Per pt, no allergies to soy or egg products.Pt not taking any weight loss meds or using  O2 at home.  Pt refused emmi video. 

## 2018-02-06 ENCOUNTER — Ambulatory Visit (AMBULATORY_SURGERY_CENTER): Payer: 59 | Admitting: Internal Medicine

## 2018-02-06 ENCOUNTER — Encounter: Payer: Self-pay | Admitting: Internal Medicine

## 2018-02-06 VITALS — BP 111/66 | HR 50 | Temp 98.7°F | Resp 10 | Ht 67.0 in | Wt 226.0 lb

## 2018-02-06 DIAGNOSIS — D122 Benign neoplasm of ascending colon: Secondary | ICD-10-CM | POA: Diagnosis not present

## 2018-02-06 DIAGNOSIS — K3189 Other diseases of stomach and duodenum: Secondary | ICD-10-CM

## 2018-02-06 DIAGNOSIS — Z86010 Personal history of colonic polyps: Secondary | ICD-10-CM

## 2018-02-06 DIAGNOSIS — K2270 Barrett's esophagus without dysplasia: Secondary | ICD-10-CM

## 2018-02-06 MED ORDER — SODIUM CHLORIDE 0.9 % IV SOLN: 500.0000 mL | Freq: Once | INTRAVENOUS | Status: DC

## 2018-02-06 NOTE — Patient Instructions (Addendum)
I do not see signs of Barrett's esophagus - looking back I suspect that you had some changes that were actually in the stomach. Barrett's guidelines for diagnosis have changed and at this point I would say that you do not have it.  One tiny colon polyp removed. I will let you know pathology results and when to have another routine colonoscopy by mail and/or My Chart.  I appreciate the opportunity to care for you. Gatha Mayer, MD, FACG  YOU HAD AN ENDOSCOPIC PROCEDURE TODAY AT [facility]:   Refer to the procedure report that was given to you for any specific questions about what was found during the examination.  If the procedure report does not answer your questions, please call your gastroenterologist to clarify.  If you requested that your care partner not be given the details of your procedure findings, then the procedure report has been included in a sealed envelope for you to review at your convenience later.  YOU SHOULD EXPECT: Some feelings of bloating in the abdomen. Passage of more gas than usual.  Walking can help get rid of the air that was put into your GI tract during the procedure and reduce the bloating. If you had a lower endoscopy (such as a colonoscopy or flexible sigmoidoscopy) you may notice spotting of blood in your stool or on the toilet paper. If you underwent a bowel prep for your procedure, you may not have a normal bowel movement for a few days.  Please Note:  You might notice some irritation and congestion in your nose or some drainage.  This is from the oxygen used during your procedure.  There is no need for concern and it should clear up in a day or so.  SYMPTOMS TO REPORT IMMEDIATELY:   Following lower endoscopy (colonoscopy or flexible sigmoidoscopy):  Excessive amounts of blood in the stool  Significant tenderness or worsening of abdominal pains  Swelling of the abdomen that is new, acute  Fever of 100F or higher   Following  upper endoscopy (EGD)  Vomiting of blood or coffee ground material  New chest pain or pain under the shoulder blades  Painful or persistently difficult swallowing  New shortness of breath  Fever of 100F or higher  Black, tarry-looking stools  For urgent or emergent issues, a gastroenterologist can be reached at any hour by calling [phone].   DIET:  We do recommend a small meal at first, but then you may proceed to your regular diet.  Drink plenty of fluids but you should avoid alcoholic beverages for 24 hours.  ACTIVITY:  You should plan to take it easy for the rest of today and you should NOT DRIVE or use heavy machinery until tomorrow (because of the sedation medicines used during the test).    FOLLOW UP: Our staff will call the number listed on your records the next business day following your procedure to check on you and address any questions or concerns that you may have regarding the information given to you following your procedure. If we do not reach you, we will leave a message.  However, if you are feeling well and you are not experiencing any problems, there is no need to return our call.  We will assume that you have returned to your regular daily activities without incident.  If any biopsies were taken you will be contacted by phone or by letter within the next 1-3 weeks.  Please call us at [phone]  [phone] if you have not heard about the biopsies in 3 weeks.    SIGNATURES/CONFIDENTIALITY: You and/or your care partner have signed paperwork which will be entered into your electronic medical record.  These signatures attest to the fact that that the information above on your After Visit Summary has been reviewed and is understood.  Full responsibility of the confidentiality of this discharge information lies with you and/or your care-partner.

## 2018-02-06 NOTE — Progress Notes (Signed)
Called to room to assist during endoscopic procedure.  Patient ID and intended procedure confirmed with present staff. Received instructions for my participation in the procedure from the performing physician.  

## 2018-02-06 NOTE — Op Note (Signed)
[facility] Patient Name: Eric Dillon Procedure Date: 02/06/2018 1:11 PM MRN: [id number] Endoscopist: Gatha Mayer , MD Age: 62 Referring MD:  Date of Birth: June 15, 1955 Gender: Male Account #: [id number] Procedure:                Colonoscopy Indications:              Surveillance: Personal history of adenomatous                            polyps on last colonoscopy 5 years ago Medicines:                Propofol per Anesthesia, Monitored Anesthesia Care Procedure:                Pre-Anesthesia Assessment:                           - Prior to the procedure, a History and Physical                            was performed, and patient medications and                            allergies were reviewed. The patient's tolerance of                            previous anesthesia was also reviewed. The risks                            and benefits of the procedure and the sedation                            options and risks were discussed with the patient.                            All questions were answered, and informed consent                            was obtained. Prior Anticoagulants: The patient has                            taken no previous anticoagulant or antiplatelet                            agents. ASA Grade Assessment: II - A patient with                            mild systemic disease. After reviewing the risks                            and benefits, the patient was deemed in                            satisfactory condition to undergo the procedure.  After obtaining informed consent, the colonoscope                            was passed under direct vision. Throughout the                            procedure, the patient's blood pressure, pulse, and                            oxygen saturations were monitored continuously. The                            Colonoscope was introduced through the anus and   advanced to the the cecum, identified by                            appendiceal orifice and ileocecal valve. The                            colonoscopy was performed without difficulty. The                            patient tolerated the procedure fairly well. The                            quality of the bowel preparation was good. The                            ileocecal valve, appendiceal orifice, and rectum                            were photographed. The bowel preparation used was                            Miralax. Scope In: 1:24:00 PM Scope Out: 1:38:09 PM Scope Withdrawal Time: 0 hours 12 minutes 2 seconds  Total Procedure Duration: 0 hours 14 minutes 9 seconds  Findings:                 The perianal and digital rectal examinations were                            normal. Pertinent negatives include normal prostate                            (size, shape, and consistency).                           A 3 mm polyp was found in the ascending colon. The                            polyp was sessile. The polyp was removed with a                            cold snare. Resection and  retrieval were complete.                            Verification of patient identification for the                            specimen was done. Estimated blood loss was minimal.                           Multiple diverticula were found in the sigmoid                            colon and ascending colon.                           Internal hemorrhoids were found.                           The exam was otherwise without abnormality on                            direct and retroflexion views. Complications:            No immediate complications. Estimated Blood Loss:     Estimated blood loss was minimal. Impression:               - One 3 mm polyp in the ascending colon, removed                            with a cold snare. Resected and retrieved.                           - Diverticulosis in the sigmoid colon  and in the                            ascending colon.                           - Internal hemorrhoids.                           - The examination was otherwise normal on direct                            and retroflexion views.                           - Personal history of colonic polyps - adenomas                            last in 2014. Recommendation:           - Patient has a contact number available for                            emergencies. The signs and symptoms of potential  delayed complications were discussed with the                            patient. Return to normal activities tomorrow.                            Written discharge instructions were provided to the                            patient.                           - Resume previous diet.                           - Continue present medications.                           - Repeat colonoscopy is recommended for                            surveillance. The colonoscopy date will be                            determined after pathology results from today's                            exam become available for review. Gatha Mayer, MD 02/06/2018 1:53:10 PM This report has been signed electronically.

## 2018-02-06 NOTE — Op Note (Signed)
[facility] Patient Name: Eric Dillon Procedure Date: 02/06/2018 1:11 PM MRN: [id number] Endoscopist: Gatha Mayer , MD Age: 62 Referring MD:  Date of Birth: 08-06-1955 Gender: Male Account #: [id number] Procedure:                Upper GI endoscopy Indications:              Follow-up of Barrett's esophagus? Medicines:                Propofol per Anesthesia, Monitored Anesthesia Care Procedure:                Pre-Anesthesia Assessment:                           - Prior to the procedure, a History and Physical                            was performed, and patient medications and                            allergies were reviewed. The patient's tolerance of                            previous anesthesia was also reviewed. The risks                            and benefits of the procedure and the sedation                            options and risks were discussed with the patient.                            All questions were answered, and informed consent                            was obtained. Prior Anticoagulants: The patient has                            taken no previous anticoagulant or antiplatelet                            agents. ASA Grade Assessment: II - A patient with                            mild systemic disease. After reviewing the risks                            and benefits, the patient was deemed in                            satisfactory condition to undergo the procedure.                           After obtaining informed consent, the endoscope was  passed under direct vision. Throughout the                            procedure, the patient's blood pressure, pulse, and                            oxygen saturations were monitored continuously. The                            Model GIF-[id number]) scope was introduced                            through the mouth, and advanced to the second part      of duodenum. The upper GI endoscopy was                            accomplished without difficulty. The patient                            tolerated the procedure well. Scope In: Scope Out: Findings:                 The Z-line was irregular and was found at the                            gastroesophageal junction.                           The exam was otherwise without abnormality.                           The cardia and gastric fundus were normal on                            retroflexion. Complications:            No immediate complications. Estimated Blood Loss:     Estimated blood loss: none. Impression:               - Z-line irregular, at the gastroesophageal                            junction. NO EVIDENCE FOR BARRETT'S ESOPHAGUS TODAY                            - NO INTESTINAL METAPLASIA IN 2015 - ONLY PRIOR -                            PROBABLY HAD INTESTINAL METAPLASIA OF CARDA                           - The examination was otherwise normal.                           - No specimens collected. Recommendation:           - Patient has a contact number  available for                            emergencies. The signs and symptoms of potential                            delayed complications were discussed with the                            patient. Return to normal activities tomorrow.                            Written discharge instructions were provided to the                            patient.                           - Resume previous diet.                           - Continue present medications.                           - See the other procedure note for documentation of                            additional recommendations. Gatha Mayer, MD 02/06/2018 1:48:53 PM This report has been signed electronically.

## 2018-02-06 NOTE — Progress Notes (Signed)
Report to PACU, RN, vss, BBS= Clear.  

## 2018-02-09 ENCOUNTER — Telehealth: Payer: Self-pay

## 2018-02-09 ENCOUNTER — Telehealth: Payer: Self-pay | Admitting: *Deleted

## 2018-02-09 NOTE — Telephone Encounter (Signed)
Attempted to reach pt. With follow-up call following endoscopic procedure 02/09/2018.  LM on pt. Voice mail to call if he has any questions or concerns.

## 2018-02-09 NOTE — Telephone Encounter (Signed)
  Follow up Call-  Call back number 02/06/2018  Post procedure Call Back phone  # [phone]  Permission to leave phone message Yes  Some recent data might be hidden     Patient questions:  Message left to call us if necessary.

## 2018-02-15 ENCOUNTER — Encounter: Payer: Self-pay | Admitting: Internal Medicine

## 2018-02-15 NOTE — Progress Notes (Signed)
3 mm adenoma Recall 2024 My Chart

## 2018-02-23 ENCOUNTER — Other Ambulatory Visit: Payer: Self-pay | Admitting: Internal Medicine

## 2018-03-10 DIAGNOSIS — E0590 Thyrotoxicosis, unspecified without thyrotoxic crisis or storm: Secondary | ICD-10-CM | POA: Insufficient documentation

## 2018-03-10 NOTE — Progress Notes (Signed)
Subjective:    Patient ID: Eric Dillon, male    DOB: [birth year], 62 y.o.   MRN: [id number]  HPI The patient is here for follow up.  He states he is exercising, but could do more.  He is trying to increase that.  He is trying to eat healthy, but has not been eating as healthy since Thanksgiving.  Hypertension: He is taking his medication daily. He is compliant with a low sodium diet.  He denies chest pain, palpitations, edema, shortness of breath and regular headaches. He is exercising .  He does monitor his blood pressure at home - it has been good.    Diabetes: He is controlling his sugars with diet. He is compliant with a diabetic diet. He is exercising. He checks his feet daily and denies foot lesions. He is up-to-date with an ophthalmology examination.   GERD with stricture:  He is taking his medication daily as prescribed.  He denies any GERD symptoms and feels his GERD is well controlled.   Hyperlipidemia: He is taking his medication daily. He is compliant with a low fat/cholesterol diet. He is exercising . He denies myalgias.   Low TSH: He has had 2 episodes of a slightly low TSH.  Thyroid antibodies have been negative.  He denies any changes in his weight or energy level but does not make sense.   Medications and allergies reviewed with patient and updated if appropriate.  Patient Active Problem List   Diagnosis Date Noted  . Subclinical hyperthyroidism 03/10/2018  . Low TSH level 11/08/2017  . Skin cancer 09/09/2017  . Bilateral lower extremity edema 09/09/2017  . Herpes zoster without complication 81/15/7262  . Diabetes ([place]) 06/08/2015  . H/O cornea transplant, left 05/30/2015  . Psoriasis 12/08/2014  . Nonspecific abnormal electrocardiogram (ECG) (EKG) 03/23/2014  . 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 Dissection of carotid artery ([place]) 01/19/2010  . 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 PROSTATE CANCER, HX OF 12/28/2008  . Personal history of colonic  adenomas 01/14/2008  . HYPERLIPIDEMIA 03/03/2007  . Essential hypertension 03/03/2007  . GERD with stricture 03/03/2007  . CEREBROVASCULAR ACCIDENT, HX OF 03/03/2007    Current Outpatient Medications on File Prior to Visit  Medication Sig Dispense Refill  . aspirin EC 81 MG tablet Take 81 mg by mouth daily.    Marland Kitchen atorvastatin (LIPITOR) 40 MG tablet TAKE 1 TABLET DAILY 90 tablet 0  . esomeprazole (NEXIUM) 40 MG capsule Take 1 capsule (40 mg total) by mouth daily before breakfast. (Patient taking differently: Take 20 mg by mouth daily. Take 2 tablets daily) 90 capsule 3  . ramipril (ALTACE) 2.5 MG capsule Take 1 capsule (2.5 mg total) by mouth daily. 90 capsule 1  . valACYclovir (VALTREX) 1000 MG tablet Take 1,000 mg by mouth daily.    . verapamil (CALAN-SR) 240 MG CR tablet TAKE 1 TABLET DAILY 90 tablet 0   No current facility-administered medications on file prior to visit.     Past Medical History:  Diagnosis Date  . Barrett's esophagus - RESOLVED 2008   RESOLVED ON SUBSEQUENT EXAMS  . Carotid artery dissection ([place]) 2011   L  . Diverticulosis   . GERD (gastroesophageal reflux disease)   . Goiter   . Hemorrhoids   . Horner's syndrome 2012  . Hyperlipidemia   . Hypertension   . Personal history of colonic adenomas 01/14/2008  . Prostate cancer ([place]) 2010  . Stroke [facility]) [place]  ARTERY     Past Surgical History:  Procedure Laterality Date  . BIOPSY THYROID  2007   colloid  admixed with scant follicular epithelium  . CAROTID ARTERY ANGIOPLASTY  2011   L  . COLONOSCOPY    . COLONOSCOPY W/ BIOPSIES AND POLYPECTOMY  multiple   adenomatous polyps, internal hemorrhoids, diverticulosis  . CORNEAL TRANSPLANT Left 2015    corneal pannus & perforation,[facility]  . DISSECTION OF CAROID ARTERY     WITH CVA  . KNEE SURGERY     RIGHT..TO STRIGHTEN PATELLA  . ROBOTIC PROSTATECTOMY  2011   Dr Dutch Gray  . UPPER GASTROINTESTINAL ENDOSCOPY  multiple    w/dilation, Barrett's , esophageal ring, hiatal hernia, gastritis    Social History   Socioeconomic History  . Marital status: Married    Spouse name: Not on file  . Number of children: 2  . Years of education: Not on file  . Highest education level: Not on file  Occupational History  . Occupation: Insurance underwriter  . Financial resource strain: Not on file  . Food insecurity:    Worry: Not on file    Inability: Not on file  . Transportation needs:    Medical: Not on file    Non-medical: Not on file  Tobacco Use  . Smoking status: Former Smoker    Last attempt to quit: 01/23/2010    Years since quitting: 8.1  . Smokeless tobacco: Never Used  . Tobacco comment: smoked 1974-2011, up to 1 ppd  Substance and Sexual Activity  . Alcohol use: Yes    Alcohol/week: 21.0 - 28.0 standard drinks    Types: 21 - 28 Cans of beer per week  . Drug use: No  . Sexual activity: Not on file  Lifestyle  . Physical activity:    Days per week: Not on file    Minutes per session: Not on file  . Stress: Not on file  Relationships  . Social connections:    Talks on phone: Not on file    Gets together: Not on file    Attends religious service: Not on file    Active member of club or organization: Not on file    Attends meetings of clubs or organizations: Not on file    Relationship status: Not on file  Other Topics Concern  . Not on file  Social History Narrative   Gets reg exercise    Family History  Problem Relation Age of Onset  . Diabetes Mother   . Stroke Father 76       CVA,BLOOD CLOT TO BRAIN  . CAD Father        BYPASS  . Stroke Paternal Grandmother 81       MULTIPLE CVA'S; onset late70s  . Cancer Neg Hx   . Colon cancer Neg Hx   . Esophageal cancer Neg Hx   . Rectal cancer Neg Hx   . Stomach cancer Neg Hx     Review of Systems  Constitutional: Negative for chills, fatigue, fever and unexpected weight change.  HENT: Negative for trouble swallowing.   Respiratory:  Negative for cough, shortness of breath and wheezing.   Cardiovascular: Negative for chest pain, palpitations and leg swelling.  Gastrointestinal:       No gerd  Neurological: Negative for light-headedness and headaches.       Objective:   Vitals:   03/11/18 1414  BP: (!) 142/80  Pulse: 63  Resp: 16  Temp: 98.3 F (  36.8 C)  SpO2: 97%   BP Readings from Last 3 Encounters:  03/11/18 (!) 142/80  02/06/18 111/66  09/09/17 (!) 146/86   Wt Readings from Last 3 Encounters:  03/11/18 225 lb 12.8 oz (102.4 kg)  02/06/18 226 lb (102.5 kg)  01/23/18 226 lb 6.4 oz (102.7 kg)   Body mass index is 35.37 kg/m.   Physical Exam    Constitutional: Appears well-developed and well-nourished. No distress.  HENT:  Head: Normocephalic and atraumatic.  Neck: Neck supple. No tracheal deviation present. No thyromegaly present.  No cervical lymphadenopathy Cardiovascular: Normal rate, regular rhythm and normal heart sounds.   No murmur heard. No carotid bruit .  No edema Pulmonary/Chest: Effort normal and breath sounds normal. No respiratory distress. No has no wheezes. No rales.  Skin: Skin is warm and dry. Not diaphoretic.  Psychiatric: Normal mood and affect. Behavior is normal.      Assessment & Plan:    See Problem List for Assessment and Plan of chronic medical problems.

## 2018-03-10 NOTE — Assessment & Plan Note (Addendum)
Diet controlled a1c Improve diet Increase exercise F/u in 6 months

## 2018-03-11 ENCOUNTER — Other Ambulatory Visit (INDEPENDENT_AMBULATORY_CARE_PROVIDER_SITE_OTHER): Payer: 59

## 2018-03-11 ENCOUNTER — Encounter: Payer: Self-pay | Admitting: Internal Medicine

## 2018-03-11 ENCOUNTER — Ambulatory Visit (INDEPENDENT_AMBULATORY_CARE_PROVIDER_SITE_OTHER): Payer: 59 | Admitting: Internal Medicine

## 2018-03-11 VITALS — BP 142/80 | HR 63 | Temp 98.3°F | Resp 16 | Ht 67.0 in | Wt 225.8 lb

## 2018-03-11 DIAGNOSIS — K219 Gastro-esophageal reflux disease without esophagitis: Secondary | ICD-10-CM | POA: Diagnosis not present

## 2018-03-11 DIAGNOSIS — E0590 Thyrotoxicosis, unspecified without thyrotoxic crisis or storm: Secondary | ICD-10-CM | POA: Diagnosis not present

## 2018-03-11 DIAGNOSIS — E119 Type 2 diabetes mellitus without complications: Secondary | ICD-10-CM | POA: Diagnosis not present

## 2018-03-11 DIAGNOSIS — K222 Esophageal obstruction: Secondary | ICD-10-CM

## 2018-03-11 DIAGNOSIS — E782 Mixed hyperlipidemia: Secondary | ICD-10-CM | POA: Diagnosis not present

## 2018-03-11 DIAGNOSIS — I10 Essential (primary) hypertension: Secondary | ICD-10-CM | POA: Diagnosis not present

## 2018-03-11 LAB — T3, FREE: T3, Free: 3.6 pg/mL (ref 2.3–4.2)

## 2018-03-11 LAB — TSH: TSH: 0.18 u[IU]/mL — AB (ref 0.35–4.50)

## 2018-03-11 LAB — HEMOGLOBIN A1C: Hgb A1c MFr Bld: 6 % (ref 4.6–6.5)

## 2018-03-11 LAB — COMPREHENSIVE METABOLIC PANEL
ALT: 52 U/L (ref 0–53)
AST: 29 U/L (ref 0–37)
Albumin: 4.3 g/dL (ref 3.5–5.2)
Alkaline Phosphatase: 76 U/L (ref 39–117)
BUN: 15 mg/dL (ref 6–23)
CO2: 30 mEq/L (ref 19–32)
Calcium: 9.8 mg/dL (ref 8.4–10.5)
Chloride: 100 mEq/L (ref 96–112)
Creatinine, Ser: 0.91 mg/dL (ref 0.40–1.50)
GFR: 89.94 mL/min (ref >60.00)
Glucose, Bld: 123 mg/dL — ABNORMAL HIGH (ref 70–99)
Potassium: 4.8 mEq/L (ref 3.5–5.1)
Sodium: 137 mEq/L (ref 135–145)
Total Bilirubin: 0.5 mg/dL (ref 0.2–1.2)
Total Protein: 7.3 g/dL (ref 6.0–8.3)

## 2018-03-11 LAB — CBC WITH DIFFERENTIAL/PLATELET
Basophils Absolute: 0.1 10*3/uL (ref 0.0–0.1)
Basophils Relative: 0.6 % (ref 0.0–3.0)
Eosinophils Absolute: 0.1 10*3/uL (ref 0.0–0.7)
Eosinophils Relative: 0.9 % (ref 0.0–5.0)
HCT: 44.7 % (ref 39.0–52.0)
Hemoglobin: 15.4 g/dL (ref 13.0–17.0)
Lymphocytes Relative: 14.9 % (ref 12.0–46.0)
Lymphs Abs: 1.3 10*3/uL (ref 0.7–4.0)
MCHC: 34.3 g/dL (ref 30.0–36.0)
MCV: 102.5 fl — ABNORMAL HIGH (ref 78.0–100.0)
Monocytes Absolute: 0.7 10*3/uL (ref 0.1–1.0)
Monocytes Relative: 8.3 % (ref 3.0–12.0)
Neutro Abs: 6.7 10*3/uL (ref 1.4–7.7)
Neutrophils Relative %: 75.3 % (ref 43.0–77.0)
Platelets: 305 10*3/uL (ref 150.0–400.0)
RBC: 4.36 Mil/uL (ref 4.22–5.81)
RDW: 13.2 % (ref 11.5–15.5)
WBC: 8.9 10*3/uL (ref 4.0–10.5)

## 2018-03-11 LAB — LIPID PANEL
CHOL/HDL RATIO: 3
Cholesterol: 171 mg/dL (ref 0–200)
HDL: 61.5 mg/dL (ref >39.00)
NonHDL: 109.05
Triglycerides: 237 mg/dL — ABNORMAL HIGH (ref 0.0–149.0)
VLDL: 47.4 mg/dL — AB (ref 0.0–40.0)

## 2018-03-11 LAB — LDL CHOLESTEROL, DIRECT: LDL DIRECT: 90 mg/dL

## 2018-03-11 LAB — T4, FREE: Free T4: 0.89 ng/dL (ref 0.60–1.60)

## 2018-03-11 NOTE — Assessment & Plan Note (Signed)
Check lipid panel  Continue daily statin Regular exercise and healthy diet encouraged  

## 2018-03-11 NOTE — Assessment & Plan Note (Signed)
Clinically euthyroid, asymptomatic Check tsh, ft3, ft4  May need to see endo if abnormal

## 2018-03-11 NOTE — Assessment & Plan Note (Signed)
GERD controlled Continue daily medication  

## 2018-03-11 NOTE — Patient Instructions (Addendum)
  Tests ordered today. Your results will be released to [place] (or called to you) after review, usually within 72hours after test completion. If any changes need to be made, you will be notified at that same time.    Please followup in 6 months

## 2018-03-11 NOTE — Assessment & Plan Note (Signed)
Slightly elevated here today, but well controlled at home BP well controlled Current regimen effective and well tolerated Continue current medications at current doses cmp

## 2018-03-12 ENCOUNTER — Other Ambulatory Visit: Payer: Self-pay | Admitting: Internal Medicine

## 2018-03-12 ENCOUNTER — Encounter: Payer: Self-pay | Admitting: Internal Medicine

## 2018-03-12 DIAGNOSIS — E0590 Thyrotoxicosis, unspecified without thyrotoxic crisis or storm: Secondary | ICD-10-CM

## 2018-03-25 ENCOUNTER — Encounter: Payer: Self-pay | Admitting: Endocrinology

## 2018-03-25 NOTE — Progress Notes (Signed)
Patient ID: Eric Dillon, male   DOB: 1955-11-16, 63 y.o.   MRN: [id number]                                                                                                               Reason for Appointment: Consultation for thyroid evaluation  Referring healthcare provider:   Chief complaint: Abnormal thyroid test   History of Present Illness:   Problem 1: Abnormal thyroid tests  The patient had a routine TSH tested in 08/2017 on his general exam with his PCP This was low at 0.17 and follow-up thyroid panel in 8/19 again showed a low TSH with normal T4 and T3 levels  He has not had any symptoms of recent palpitations, shakiness, feeling excessively warm, feeling sweaty, nervousness insomnia or fatigue He has not no weight loss  He again had his thyroid labs done in 02/2018 which showed similar results including normal free T3 levels  Wt Readings from Last 3 Encounters:  03/26/18 229 lb 9.6 oz (104.1 kg)  03/11/18 225 lb 12.8 oz (102.4 kg)  02/06/18 226 lb (102.5 kg)     Thyroid function tests as follows:     Lab Results  Component Value Date   FREET4 0.89 03/11/2018   FREET4 0.98 11/05/2017   FREET4 0.93 04/02/2012   T3FREE 3.6 03/11/2018   T3FREE 3.3 11/05/2017   T3FREE 2.7 04/02/2012   TSH 0.18 (L) 03/11/2018   TSH 0.23 (L) 11/05/2017   TSH 0.17 (L) 09/09/2017   No results found for: THYROTRECAB   PROBLEM 2: Thyroid enlargement  In 2007 he was having an MRI with angiogram of his neck and a right-sided thyroid nodule was incidentally discovered Ultrasound showed enlarged right sided thyroid nodule On biopsy this was benign with cytology showing colloid mixed with scant follicular epithelium He has had occasional ultrasound exams subsequently with the last exam in 2015 showing no change in the thyroid nodule which was about 8 cm in size    Allergies as of 03/26/2018      Reactions   Amoxicillin    REACTION: hives   Codeine    REACTION: rash   Sulfonamide  Derivatives    REACTION: angioedema   Erythromycin    REACTION: ? reaction      Medication List       Accurate as of March 26, 2018  4:22 PM. Always use your most recent med list.        aspirin EC 81 MG tablet Take 81 mg by mouth daily.   atorvastatin 40 MG tablet Commonly known as:  LIPITOR TAKE 1 TABLET DAILY   esomeprazole 40 MG capsule Commonly known as:  NEXIUM Take 1 capsule (40 mg total) by mouth daily before breakfast.   ramipril 2.5 MG capsule Commonly known as:  ALTACE Take 1 capsule (2.5 mg total) by mouth daily.   VALTREX 1000 MG tablet Generic drug:  valACYclovir Take 1,000 mg by mouth daily.   verapamil 240 MG CR tablet Commonly known as:  CALAN-SR TAKE  1 TABLET DAILY           Past Medical History:  Diagnosis Date  . Barrett's esophagus - RESOLVED 2008   RESOLVED ON SUBSEQUENT EXAMS  . Carotid artery dissection ([place]) 2011   L  . Diverticulosis   . GERD (gastroesophageal reflux disease)   . Goiter   . Hemorrhoids   . Horner's syndrome 2012  . Hyperlipidemia   . Hypertension   . Personal history of colonic adenomas 01/14/2008  . Prostate cancer ([place]) 2010  . Stroke ([place]) 1996   FROM DISSECTING R CAROID ARTERY     Past Surgical History:  Procedure Laterality Date  . BIOPSY THYROID  2007   colloid  admixed with scant follicular epithelium  . CAROTID ARTERY ANGIOPLASTY  2011   L  . COLONOSCOPY    . COLONOSCOPY W/ BIOPSIES AND POLYPECTOMY  multiple   adenomatous polyps, internal hemorrhoids, diverticulosis  . CORNEAL TRANSPLANT Left 2015    corneal pannus & perforation,[facility]  . DISSECTION OF CAROID ARTERY     WITH CVA  . KNEE SURGERY     RIGHT..TO STRIGHTEN PATELLA  . ROBOTIC PROSTATECTOMY  2011   Dr Dutch Gray  . UPPER GASTROINTESTINAL ENDOSCOPY  multiple   w/dilation, Barrett's , esophageal ring, hiatal hernia, gastritis    Family History  Problem Relation Age of Onset  . Diabetes Mother   . Stroke Father 60        CVA,BLOOD CLOT TO BRAIN  . CAD Father        BYPASS  . Stroke Paternal Grandmother 81       MULTIPLE CVA'S; onset late70s  . Cancer Neg Hx   . Colon cancer Neg Hx   . Esophageal cancer Neg Hx   . Rectal cancer Neg Hx   . Stomach cancer Neg Hx     Social History:  reports that he quit smoking about 8 years ago. He has never used smokeless tobacco. He reports current alcohol use of about 21.0 - 28.0 standard drinks of alcohol per week. He reports that he does not use drugs.  Allergies:  Allergies  Allergen Reactions  . Amoxicillin     REACTION: hives  . Codeine     REACTION: rash  . Sulfonamide Derivatives     REACTION: angioedema  . Erythromycin     REACTION: ? reaction     Review of Systems  Constitutional: Negative for weight loss.  HENT: Negative for trouble swallowing.   Respiratory: Negative for shortness of breath.   Cardiovascular: Negative for palpitations and leg swelling.  Endocrine: Negative for fatigue and heat intolerance.  Musculoskeletal: Negative for joint pain.  Skin: Negative for dry skin.  Neurological: Positive for tremors.  Psychiatric/Behavioral: Negative for insomnia.       Examination:   BP 134/80 (BP Location: Left Arm, Patient Position: Sitting, Cuff Size: Normal)   Pulse 80   Ht 5\' 7"  (1.702 m)   Wt 229 lb 9.6 oz (104.1 kg)   SpO2 94%   BMI 35.96 kg/m    General Appearance:  well-built and nourished, pleasant, not anxious        Eyes: No abnormal prominence, lid lag or stare present.  No swelling of the eyelids   Neck: The thyroid is palpable on the right side, mostly laterally The thyroid enlargement is about 4-4.5 cm, relatively soft and smooth and not extending to the isthmus Left lobe is not palpable  There is no lymphadenopathy in the neck .  Heart: normal S1 and S2, no murmurs .          Lungs: breath sounds are normal bilaterally without added sounds  Abdomen: no hepatosplenomegaly or other palpable  abnormality  Extremities: hands are normal temperature  No ankle edema.  Neurological:  No tremor present Deep tendon reflexes at biceps are normal to slightly brisk.  Skin: No rash, abnormal thickening of the skin on the lower legs seen     Assessment/Plan:  He has a nodular with autonomous function without hyperthyroidism  With his TSH not being completely suppressed and his free T4 and free T3 being consistently normal there is no need to consider treatment He is also quite asymptomatic Discussed with patient that potential symptoms of hyperthyroidism  Longstanding 8 cm right thyroid nodule, likely to be a colloid nodule which has been previously benign on biopsy and stable on subsequent ultrasound exams as of 2015  Patient understands the discussion regarding his diagnosis and management. Currently he only needs to be observed every 6 months for development of any progression of his subclinical hyperthyroidism Since his nodule has been very stable for over 12 years he does not need any further ultrasound exam at this time He will be followed clinically  All questions were answered satisfactorily  Consult note sent to referring physician  Elayne Snare 03/26/2018, 4:22 PM    Note: This office note was prepared with Dragon voice recognition system technology. Any transcriptional errors that result from this process are unintentional.

## 2018-03-26 ENCOUNTER — Ambulatory Visit (INDEPENDENT_AMBULATORY_CARE_PROVIDER_SITE_OTHER): Payer: 59 | Admitting: Endocrinology

## 2018-03-26 ENCOUNTER — Encounter: Payer: Self-pay | Admitting: Endocrinology

## 2018-03-26 VITALS — BP 134/80 | HR 80 | Ht 67.0 in | Wt 229.6 lb

## 2018-03-26 DIAGNOSIS — E041 Nontoxic single thyroid nodule: Secondary | ICD-10-CM

## 2018-03-26 DIAGNOSIS — E0590 Thyrotoxicosis, unspecified without thyrotoxic crisis or storm: Secondary | ICD-10-CM

## 2018-04-01 ENCOUNTER — Encounter: Payer: Self-pay | Admitting: Internal Medicine

## 2018-04-01 ENCOUNTER — Ambulatory Visit (INDEPENDENT_AMBULATORY_CARE_PROVIDER_SITE_OTHER): Payer: 59 | Admitting: Internal Medicine

## 2018-04-01 VITALS — BP 142/70 | HR 68 | Temp 98.3°F | Resp 16 | Ht 67.0 in | Wt 229.0 lb

## 2018-04-01 DIAGNOSIS — J209 Acute bronchitis, unspecified: Secondary | ICD-10-CM | POA: Insufficient documentation

## 2018-04-01 MED ORDER — DOXYCYCLINE HYCLATE 100 MG PO TABS: 100.0000 mg | ORAL_TABLET | Freq: Two times a day (BID) | ORAL | 0 refills | Status: DC

## 2018-04-01 NOTE — Assessment & Plan Note (Signed)
Likely bacterial  Start doxycycline otc cold medications Rest, fluid Call if no improvement  

## 2018-04-01 NOTE — Progress Notes (Signed)
Subjective:    Patient ID: Eric Dillon, male    DOB: 1955-07-26, 63 y.o.   MRN: [id number]  HPI He is here for an acute visit for cold symptoms.  His symptoms started a couple of weeks ago.  He is better during the day but it is worse at night.    He is experiencing nasal congestion, sore throat, productive cough and headaches.  He denies any fevers, chills, ear pain, sinus pain, shortness of breath and wheezing.  He denies other symptoms.  His cough seems to be getting worse.  He has tried taking many cold meds - nyaquil, mucinex, robitussin.    Medications and allergies reviewed with patient and updated if appropriate.  Patient Active Problem List   Diagnosis Date Noted  . Acute bronchitis 04/01/2018  . Subclinical hyperthyroidism 03/10/2018  . Skin cancer 09/09/2017  . Bilateral lower extremity edema 09/09/2017  . Herpes zoster without complication 78/29/5621  . Diabetes ([place]) 06/08/2015  . H/O cornea transplant, left 05/30/2015  . Psoriasis 12/08/2014  . Nonspecific abnormal electrocardiogram (ECG) (EKG) 03/23/2014  . 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 Dissection of carotid artery ([place]) 01/19/2010  . 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 PROSTATE CANCER, HX OF 12/28/2008  . Personal history of colonic adenomas 01/14/2008  . HYPERLIPIDEMIA 03/03/2007  . Essential hypertension 03/03/2007  . GERD with stricture 03/03/2007  . CEREBROVASCULAR ACCIDENT, HX OF 03/03/2007    Current Outpatient Medications on File Prior to Visit  Medication Sig Dispense Refill  . aspirin EC 81 MG tablet Take 81 mg by mouth daily.    Marland Kitchen atorvastatin (LIPITOR) 40 MG tablet TAKE 1 TABLET DAILY 90 tablet 0  . esomeprazole (NEXIUM) 40 MG capsule Take 1 capsule (40 mg total) by mouth daily before breakfast. (Patient taking differently: Take 20 mg by mouth daily. Take 2 tablets daily) 90 capsule 3  . ramipril (ALTACE) 2.5 MG capsule Take 1 capsule (2.5 mg total) by mouth daily. 90  capsule 1  . valACYclovir (VALTREX) 1000 MG tablet Take 1,000 mg by mouth daily.    . verapamil (CALAN-SR) 240 MG CR tablet TAKE 1 TABLET DAILY 90 tablet 0   No current facility-administered medications on file prior to visit.     Past Medical History:  Diagnosis Date  . Barrett's esophagus - RESOLVED 2008   RESOLVED ON SUBSEQUENT EXAMS  . Carotid artery dissection ([place]) 2011   L  . Diverticulosis   . GERD (gastroesophageal reflux disease)   . Goiter   . Hemorrhoids   . Horner's syndrome 2012  . Hyperlipidemia   . Hypertension   . Personal history of colonic adenomas 01/14/2008  . Prostate cancer ([place]) 2010  . Stroke ([place]) 1996   FROM DISSECTING R CAROID ARTERY     Past Surgical History:  Procedure Laterality Date  . BIOPSY THYROID  2007   colloid  admixed with scant follicular epithelium  . CAROTID ARTERY ANGIOPLASTY  2011   L  . COLONOSCOPY    . COLONOSCOPY W/ BIOPSIES AND POLYPECTOMY  multiple   adenomatous polyps, internal hemorrhoids, diverticulosis  . CORNEAL TRANSPLANT Left 2015    corneal pannus & perforation,[facility]  . DISSECTION OF CAROID ARTERY     WITH CVA  . KNEE SURGERY     RIGHT..TO STRIGHTEN PATELLA  . ROBOTIC PROSTATECTOMY  2011   Dr Dutch Gray  . UPPER GASTROINTESTINAL ENDOSCOPY  multiple   w/dilation, Barrett's , esophageal ring, hiatal hernia, gastritis  Social History   Socioeconomic History  . Marital status: Married    Spouse name: Not on file  . Number of children: 2  . Years of education: Not on file  . Highest education level: Not on file  Occupational History  . Occupation: Insurance underwriter  . Financial resource strain: Not on file  . Food insecurity:    Worry: Not on file    Inability: Not on file  . Transportation needs:    Medical: Not on file    Non-medical: Not on file  Tobacco Use  . Smoking status: Former Smoker    Last attempt to quit: 01/23/2010    Years since quitting: 8.1  . Smokeless tobacco: Never Used    . Tobacco comment: smoked 1974-2011, up to 1 ppd  Substance and Sexual Activity  . Alcohol use: Yes    Alcohol/week: 21.0 - 28.0 standard drinks    Types: 21 - 28 Cans of beer per week  . Drug use: No  . Sexual activity: Not on file  Lifestyle  . Physical activity:    Days per week: Not on file    Minutes per session: Not on file  . Stress: Not on file  Relationships  . Social connections:    Talks on phone: Not on file    Gets together: Not on file    Attends religious service: Not on file    Active member of club or organization: Not on file    Attends meetings of clubs or organizations: Not on file    Relationship status: Not on file  Other Topics Concern  . Not on file  Social History Narrative   Gets reg exercise    Family History  Problem Relation Age of Onset  . Diabetes Mother   . Stroke Father 44       CVA,BLOOD CLOT TO BRAIN  . CAD Father        BYPASS  . Stroke Paternal Grandmother 81       MULTIPLE CVA'S; onset late70s  . Cancer Neg Hx   . Colon cancer Neg Hx   . Esophageal cancer Neg Hx   . Rectal cancer Neg Hx   . Stomach cancer Neg Hx     Review of Systems  Constitutional: Negative for chills and fever.  HENT: Positive for congestion and sore throat. Negative for ear pain and sinus pain.   Respiratory: Positive for cough (productvie). Negative for shortness of breath and wheezing.   Gastrointestinal: Negative for diarrhea and nausea.  Musculoskeletal: Negative for myalgias.  Neurological: Positive for headaches. Negative for dizziness and light-headedness.       Objective:   Vitals:   04/01/18 1434  BP: (!) 142/70  Pulse: 68  Resp: 16  Temp: 98.3 F (36.8 C)  SpO2: 97%   Filed Weights   04/01/18 1434  Weight: 229 lb (103.9 kg)   Body mass index is 35.87 kg/m.  Wt Readings from Last 3 Encounters:  04/01/18 229 lb (103.9 kg)  03/26/18 229 lb 9.6 oz (104.1 kg)  03/11/18 225 lb 12.8 oz (102.4 kg)     Physical Exam GENERAL  APPEARANCE: Appears stated age, well appearing, NAD EYES: conjunctiva clear, no icterus HEENT: bilateral tympanic membranes and ear canals normal, oropharynx with mild erythema, no thyromegaly, trachea midline, no cervical or supraclavicular lymphadenopathy LUNGS: Clear to auscultation without wheeze or crackles, unlabored breathing, good air entry bilaterally CARDIOVASCULAR: Normal S1,S2 without murmurs, no edema SKIN: warm, dry  Assessment & Plan:   See Problem List for Assessment and Plan of chronic medical problems.

## 2018-04-01 NOTE — Patient Instructions (Signed)
° ° °  Take the antibiotic as prescribed - complete the entire course.   ° ° °Continue over the counter cold medication, advil and tylenol.  Increase your fluids and rest.  ° ° °Call if no improvement   ° °

## 2018-08-21 ENCOUNTER — Other Ambulatory Visit: Payer: Self-pay | Admitting: Internal Medicine

## 2018-09-10 NOTE — Patient Instructions (Addendum)
Tests ordered today. Your results will be released to [facility] (or called to you) after review, usually within 72hours after test completion. If any changes need to be made, you will be notified at that same time.  All other Health Maintenance issues reviewed.   All recommended immunizations and age-appropriate screenings are up-to-date or discussed.  No immunizations administered today.   Medications reviewed and updated.  Changes include :   Steroid cream  Your prescription(s) have been submitted to your pharmacy. Please take as directed and contact our office if you believe you are having problem(s) with the medication(s).   Please followup in 6 months    Health Maintenance, Male A healthy lifestyle and preventive care is important for your health and wellness. Ask your health care provider about what schedule of regular examinations is right for you. What should I know about weight and diet? Eat a Healthy Diet  Eat plenty of vegetables, fruits, whole grains, low-fat dairy products, and lean protein.  Do not eat a lot of foods high in solid fats, added sugars, or salt.  Maintain a Healthy Weight Regular exercise can help you achieve or maintain a healthy weight. You should:  Do at least 150 minutes of exercise each week. The exercise should increase your heart rate and make you sweat (moderate-intensity exercise).  Do strength-training exercises at least twice a week. Watch Your Levels of Cholesterol and Blood Lipids  Have your blood tested for lipids and cholesterol every 5 years starting at 62 years of age. If you are at high risk for heart disease, you should start having your blood tested when you are 62 years old. You may need to have your cholesterol levels checked more often if: ? Your lipid or cholesterol levels are high. ? You are older than 62 years of age. ? You are at high risk for heart disease. What should I know about cancer screening? Many types of cancers can  be detected early and may often be prevented. Lung Cancer  You should be screened every year for lung cancer if: ? You are a current smoker who has smoked for at least 30 years. ? You are a former smoker who has quit within the past 15 years.  Talk to your health care provider about your screening options, when you should start screening, and how often you should be screened. Colorectal Cancer  Routine colorectal cancer screening usually begins at 62 years of age and should be repeated every 5-10 years until you are 61 years old. You may need to be screened more often if early forms of precancerous polyps or small growths are found. Your health care provider may recommend screening at an earlier age if you have risk factors for colon cancer.  Your health care provider may recommend using home test kits to check for hidden blood in the stool.  A small camera at the end of a tube can be used to examine your colon (sigmoidoscopy or colonoscopy). This checks for the earliest forms of colorectal cancer. Prostate and Testicular Cancer  Depending on your age and overall health, your health care provider may do certain tests to screen for prostate and testicular cancer.  Talk to your health care provider about any symptoms or concerns you have about testicular or prostate cancer. Skin Cancer  Check your skin from head to toe regularly.  Tell your health care provider about any new moles or changes in moles, especially if: ? There is a change in a mole's  size, shape, or color. ? You have a mole that is larger than a pencil eraser.  Always use sunscreen. Apply sunscreen liberally and repeat throughout the day.  Protect yourself by wearing long sleeves, pants, a wide-brimmed hat, and sunglasses when outside. What should I know about heart disease, diabetes, and high blood pressure?  If you are 22-72 years of age, have your blood pressure checked every 3-5 years. If you are 62 years of age or  older, have your blood pressure checked every year. You should have your blood pressure measured twice-once when you are at a hospital or clinic, and once when you are not at a hospital or clinic. Record the average of the two measurements. To check your blood pressure when you are not at a hospital or clinic, you can use: ? An automated blood pressure machine at a pharmacy. ? A home blood pressure monitor.  Talk to your health care provider about your target blood pressure.  If you are between 65-45 years old, ask your health care provider if you should take aspirin to prevent heart disease.  Have regular diabetes screenings by checking your fasting blood sugar level. ? If you are at a normal weight and have a low risk for diabetes, have this test once every three years after the age of 95. ? If you are overweight and have a high risk for diabetes, consider being tested at a younger age or more often.  A one-time screening for abdominal aortic aneurysm (AAA) by ultrasound is recommended for men aged 5-75 years who are current or former smokers. What should I know about preventing infection? Hepatitis B If you have a higher risk for hepatitis B, you should be screened for this virus. Talk with your health care provider to find out if you are at risk for hepatitis B infection. Hepatitis C Blood testing is recommended for:  Everyone born from [birth year] through 1965.  Anyone with known risk factors for hepatitis C. Sexually Transmitted Diseases (STDs)  You should be screened each year for STDs including gonorrhea and chlamydia if: ? You are sexually active and are younger than 62 years of age. ? You are older than 62 years of age and your health care provider tells you that you are at risk for this type of infection. ? Your sexual activity has changed since you were last screened and you are at an increased risk for chlamydia or gonorrhea. Ask your health care provider if you are at risk.  Talk  with your health care provider about whether you are at high risk of being infected with HIV. Your health care provider may recommend a prescription medicine to help prevent HIV infection. What else can I do?  Schedule regular health, dental, and eye exams.  Stay current with your vaccines (immunizations).  Do not use any tobacco products, such as cigarettes, chewing tobacco, and e-cigarettes. If you need help quitting, ask your health care provider.  Limit alcohol intake to no more than 2 drinks per day. One drink equals 12 ounces of beer, 5 ounces of wine, or 1 ounces of hard liquor.  Do not use street drugs.  Do not share needles.  Ask your health care provider for help if you need support or information about quitting drugs.  Tell your health care provider if you often feel depressed.  Tell your health care provider if you have ever been abused or do not feel safe at home. This information is not intended to replace  advice given to you by your health care provider. Make sure you discuss any questions you have with your health care provider. Document Released: 09/07/2007 Document Revised: 11/08/2015 Document Reviewed: 12/13/2014 Elsevier Interactive Patient Education  2019 [employer].

## 2018-09-10 NOTE — Progress Notes (Signed)
Subjective:    Patient ID: Eric Dillon, male    DOB: 07-Oct-1955, 63 y.o.   MRN: [id number]  HPI He is here for a physical exam.    Right ball of foot feels like there is a blister on his foot.  No pain.     Red Lesions on skin - itch  -jan one on may - last month. Same size.  One did go away.     Medications and allergies reviewed with patient and updated if appropriate.  Patient Active Problem List   Diagnosis Date Noted  . Subclinical hyperthyroidism 03/10/2018  . Skin cancer 09/09/2017  . Bilateral lower extremity edema 09/09/2017  . Herpes zoster without complication 40/98/1191  . Diabetes ([place]) 06/08/2015  . H/O cornea transplant, left 05/30/2015  . Psoriasis 12/08/2014  . Nonspecific abnormal electrocardiogram (ECG) (EKG) 03/23/2014  . 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 Dissection of carotid artery ([place]) 01/19/2010  . 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 PROSTATE CANCER, HX OF 12/28/2008  . Personal history of colonic adenomas 01/14/2008  . HYPERLIPIDEMIA 03/03/2007  . Essential hypertension 03/03/2007  . GERD with stricture 03/03/2007  . CEREBROVASCULAR ACCIDENT, HX OF 03/03/2007    Current Outpatient Medications on File Prior to Visit  Medication Sig Dispense Refill  . aspirin EC 81 MG tablet Take 81 mg by mouth daily.    Marland Kitchen atorvastatin (LIPITOR) 40 MG tablet TAKE 1 TABLET DAILY 90 tablet 0  . esomeprazole (NEXIUM) 40 MG capsule Take 1 capsule (40 mg total) by mouth daily before breakfast. (Patient taking differently: Take 20 mg by mouth daily. Take 2 tablets daily) 90 capsule 3  . ramipril (ALTACE) 2.5 MG capsule TAKE 1 CAPSULE DAILY 90 capsule 0  . valACYclovir (VALTREX) 1000 MG tablet Take 1,000 mg by mouth daily.    . verapamil (CALAN-SR) 240 MG CR tablet TAKE 1 TABLET DAILY 90 tablet 0   No current facility-administered medications on file prior to visit.     Past Medical History:  Diagnosis Date  . Barrett's esophagus - RESOLVED 2008    RESOLVED ON SUBSEQUENT EXAMS  . Carotid artery dissection ([place]) 2011   L  . Diverticulosis   . GERD (gastroesophageal reflux disease)   . Goiter   . Hemorrhoids   . Horner's syndrome 2012  . Hyperlipidemia   . Hypertension   . Personal history of colonic adenomas 01/14/2008  . Prostate cancer ([place]) 2010  . Stroke ([place]) 1996   FROM DISSECTING R CAROID ARTERY     Past Surgical History:  Procedure Laterality Date  . BIOPSY THYROID  2007   colloid  admixed with scant follicular epithelium  . CAROTID ARTERY ANGIOPLASTY  2011   L  . COLONOSCOPY    . COLONOSCOPY W/ BIOPSIES AND POLYPECTOMY  multiple   adenomatous polyps, internal hemorrhoids, diverticulosis  . CORNEAL TRANSPLANT Left 2015    corneal pannus & perforation,[facility]  . DISSECTION OF CAROID ARTERY     WITH CVA  . KNEE SURGERY     RIGHT..TO STRIGHTEN PATELLA  . ROBOTIC PROSTATECTOMY  2011   Dr Dutch Gray  . UPPER GASTROINTESTINAL ENDOSCOPY  multiple   w/dilation, Barrett's , esophageal ring, hiatal hernia, gastritis    Social History   Socioeconomic History  . Marital status: Married    Spouse name: Not on file  . Number of children: 2  . Years of education: Not on file  . Highest education level: Not on file  Occupational History  .  Occupation: Insurance underwriter  . Financial resource strain: Not on file  . Food insecurity    Worry: Not on file    Inability: Not on file  . Transportation needs    Medical: Not on file    Non-medical: Not on file  Tobacco Use  . Smoking status: Former Smoker    Quit date: 01/23/2010    Years since quitting: 8.6  . Smokeless tobacco: Never Used  . Tobacco comment: smoked 1974-2011, up to 1 ppd  Substance and Sexual Activity  . Alcohol use: Yes    Alcohol/week: 21.0 - 28.0 standard drinks    Types: 21 - 28 Cans of beer per week  . Drug use: No  . Sexual activity: Not on file  Lifestyle  . Physical activity    Days per week: Not on file    Minutes per session:  Not on file  . Stress: Not on file  Relationships  . Social Herbalist on phone: Not on file    Gets together: Not on file    Attends religious service: Not on file    Active member of club or organization: Not on file    Attends meetings of clubs or organizations: Not on file    Relationship status: Not on file  Other Topics Concern  . Not on file  Social History Narrative   Gets reg exercise    Family History  Problem Relation Age of Onset  . Diabetes Mother   . Stroke Father 57       CVA,BLOOD CLOT TO BRAIN  . CAD Father        BYPASS  . Stroke Paternal Grandmother 81       MULTIPLE CVA'S; onset late70s  . Cancer Neg Hx   . Colon cancer Neg Hx   . Esophageal cancer Neg Hx   . Rectal cancer Neg Hx   . Stomach cancer Neg Hx     Review of Systems  Constitutional: Negative for chills and fever.  Eyes: Negative for visual disturbance.  Respiratory: Negative for cough, shortness of breath and wheezing.   Cardiovascular: Negative for chest pain, palpitations and leg swelling.  Gastrointestinal: Negative for abdominal pain, blood in stool, constipation, diarrhea and nausea.       Gerd controlled  Genitourinary: Negative for difficulty urinating, dysuria and hematuria.  Musculoskeletal: Positive for arthralgias (knees). Negative for back pain.  Skin: Positive for color change. Negative for rash.  Neurological: Negative for dizziness, light-headedness, numbness and headaches.  Psychiatric/Behavioral: Negative for dysphoric mood. The patient is not nervous/anxious.        Objective:   Vitals:   09/11/18 1404  BP: 140/78  Pulse: 67  Temp: 98.3 F (36.8 C)  SpO2: 97%   Filed Weights   09/11/18 1404  Weight: 225 lb (102.1 kg)   Body mass index is 35.24 kg/m.  Wt Readings from Last 3 Encounters:  09/11/18 225 lb (102.1 kg)  04/01/18 229 lb (103.9 kg)  03/26/18 229 lb 9.6 oz (104.1 kg)     Physical Exam Constitutional: He appears well-developed and  well-nourished. No distress.  HENT:  Head: Normocephalic and atraumatic.  Right Ear: External ear normal.  Left Ear: External ear normal.  Mouth/Throat: Oropharynx is clear and moist.  Normal ear canals and TM b/l  Eyes: Conjunctivae and EOM are normal.  Neck: Neck supple. No tracheal deviation present. No thyromegaly present.  No carotid bruit  Cardiovascular: Normal rate, regular rhythm,  normal heart sounds and intact distal pulses.   No murmur heard. Pulmonary/Chest: Effort normal and breath sounds normal. No respiratory distress. He has no wheezes. He has no rales.  Abdominal: Soft. Umbilical hernia - reducible He exhibits no distension. There is no tenderness.  Genitourinary: deferred  Musculoskeletal: He exhibits no edema.  Lymphadenopathy:   He has no cervical adenopathy.  Skin: Skin is warm and dry. He is not diaphoretic.  Two erythematous lesions left lateral lower leg - slightly darker on edges- size of walnut Psychiatric: He has a normal mood and affect. His behavior is normal.   Diabetic Foot Exam - Simple   Simple Foot Form Diabetic Foot exam was performed with the following findings: Yes 09/11/2018  2:24 PM  Visual Inspection No deformities, no ulcerations, no other skin breakdown bilaterally: Yes Sensation Testing Intact to touch and monofilament testing bilaterally: Yes Pulse Check Posterior Tibialis and Dorsalis pulse intact bilaterally: Yes Comments Small cyst like lesion plantar surface ball of foot on right - non tender          Assessment & Plan:   Physical exam: Screening blood work  ordered Immunizations  Up to date  Colonoscopy   Up to date  Eye exams - dr Tressie Ellis   - Up to date  Exercise    Stationary bike 30 min a day Weight  Advised weight loss Skin   Red lesions  Substance abuse   none  See Problem List for Assessment and Plan of chronic medical problems.   FU in 6 mo

## 2018-09-11 ENCOUNTER — Other Ambulatory Visit: Payer: Self-pay

## 2018-09-11 ENCOUNTER — Ambulatory Visit (INDEPENDENT_AMBULATORY_CARE_PROVIDER_SITE_OTHER): Payer: 59 | Admitting: Internal Medicine

## 2018-09-11 ENCOUNTER — Other Ambulatory Visit (INDEPENDENT_AMBULATORY_CARE_PROVIDER_SITE_OTHER): Payer: 59

## 2018-09-11 ENCOUNTER — Encounter: Payer: Self-pay | Admitting: Internal Medicine

## 2018-09-11 VITALS — BP 140/78 | HR 67 | Temp 98.3°F | Ht 67.0 in | Wt 225.0 lb

## 2018-09-11 DIAGNOSIS — E119 Type 2 diabetes mellitus without complications: Secondary | ICD-10-CM | POA: Diagnosis not present

## 2018-09-11 DIAGNOSIS — Z0001 Encounter for general adult medical examination with abnormal findings: Secondary | ICD-10-CM

## 2018-09-11 DIAGNOSIS — E782 Mixed hyperlipidemia: Secondary | ICD-10-CM

## 2018-09-11 DIAGNOSIS — Z8546 Personal history of malignant neoplasm of prostate: Secondary | ICD-10-CM

## 2018-09-11 DIAGNOSIS — K222 Esophageal obstruction: Secondary | ICD-10-CM

## 2018-09-11 DIAGNOSIS — E0590 Thyrotoxicosis, unspecified without thyrotoxic crisis or storm: Secondary | ICD-10-CM

## 2018-09-11 DIAGNOSIS — I10 Essential (primary) hypertension: Secondary | ICD-10-CM | POA: Diagnosis not present

## 2018-09-11 DIAGNOSIS — K219 Gastro-esophageal reflux disease without esophagitis: Secondary | ICD-10-CM

## 2018-09-11 LAB — COMPREHENSIVE METABOLIC PANEL
ALT: 42 U/L (ref 0–53)
AST: 27 U/L (ref 0–37)
Albumin: 4.2 g/dL (ref 3.5–5.2)
Alkaline Phosphatase: 70 U/L (ref 39–117)
BUN: 12 mg/dL (ref 6–23)
CO2: 26 mEq/L (ref 19–32)
Calcium: 9.4 mg/dL (ref 8.4–10.5)
Chloride: 101 mEq/L (ref 96–112)
Creatinine, Ser: 0.93 mg/dL (ref 0.40–1.50)
GFR: 82.39 mL/min (ref >60.00)
Glucose, Bld: 168 mg/dL — ABNORMAL HIGH (ref 70–99)
Potassium: 4.2 mEq/L (ref 3.5–5.1)
Sodium: 137 mEq/L (ref 135–145)
Total Bilirubin: 0.4 mg/dL (ref 0.2–1.2)
Total Protein: 6.8 g/dL (ref 6.0–8.3)

## 2018-09-11 LAB — LIPID PANEL
Cholesterol: 162 mg/dL (ref 0–200)
HDL: 62.9 mg/dL (ref >39.00)
LDL Cholesterol: 84 mg/dL (ref 0–99)
NonHDL: 98.96
Total CHOL/HDL Ratio: 3
Triglycerides: 77 mg/dL (ref 0.0–149.0)
VLDL: 15.4 mg/dL (ref 0.0–40.0)

## 2018-09-11 LAB — CBC WITH DIFFERENTIAL/PLATELET
Basophils Absolute: 0.1 10*3/uL (ref 0.0–0.1)
Basophils Relative: 0.8 % (ref 0.0–3.0)
Eosinophils Absolute: 0 10*3/uL (ref 0.0–0.7)
Eosinophils Relative: 0.5 % (ref 0.0–5.0)
HCT: 43.1 % (ref 39.0–52.0)
Hemoglobin: 14.8 g/dL (ref 13.0–17.0)
Lymphocytes Relative: 10.4 % — ABNORMAL LOW (ref 12.0–46.0)
Lymphs Abs: 0.9 10*3/uL (ref 0.7–4.0)
MCHC: 34.3 g/dL (ref 30.0–36.0)
MCV: 103 fl — ABNORMAL HIGH (ref 78.0–100.0)
Monocytes Absolute: 0.7 10*3/uL (ref 0.1–1.0)
Monocytes Relative: 7.3 % (ref 3.0–12.0)
Neutro Abs: 7.3 10*3/uL (ref 1.4–7.7)
Neutrophils Relative %: 81 % — ABNORMAL HIGH (ref 43.0–77.0)
Platelets: 267 10*3/uL (ref 150.0–400.0)
RBC: 4.18 Mil/uL — ABNORMAL LOW (ref 4.22–5.81)
RDW: 13.3 % (ref 11.5–15.5)
WBC: 9.1 10*3/uL (ref 4.0–10.5)

## 2018-09-11 LAB — HEMOGLOBIN A1C: Hgb A1c MFr Bld: 6.1 % (ref 4.6–6.5)

## 2018-09-11 LAB — TSH: TSH: 0.18 u[IU]/mL — ABNORMAL LOW (ref 0.35–4.50)

## 2018-09-11 LAB — T4, FREE: Free T4: 0.94 ng/dL (ref 0.60–1.60)

## 2018-09-11 LAB — T3, FREE: T3, Free: 3.4 pg/mL (ref 2.3–4.2)

## 2018-09-11 MED ORDER — TRIAMCINOLONE ACETONIDE 0.1 % EX CREA: 1.0000 "application " | TOPICAL_CREAM | Freq: Two times a day (BID) | CUTANEOUS | 0 refills | Status: AC

## 2018-09-11 NOTE — Assessment & Plan Note (Signed)
BP Readings from Last 3 Encounters:  09/11/18 140/78  04/01/18 (!) 142/70  03/26/18 134/80   BP controlled Current regimen effective and well tolerated Continue current medications at current doses cmp

## 2018-09-11 NOTE — Assessment & Plan Note (Signed)
Diet controlled Check a1c Low sugar / carb diet Stressed regular exercise  

## 2018-09-11 NOTE — Assessment & Plan Note (Signed)
Follows with urology

## 2018-09-11 NOTE — Assessment & Plan Note (Signed)
GERD controlled Continue daily medication  

## 2018-09-11 NOTE — Assessment & Plan Note (Signed)
Check lipid panel  Continue daily statin Regular exercise and healthy diet encouraged  

## 2018-09-11 NOTE — Assessment & Plan Note (Signed)
Follows with Dr Dwyane Dee - has appt tfts ordered

## 2018-09-12 ENCOUNTER — Encounter: Payer: Self-pay | Admitting: Internal Medicine

## 2018-09-28 ENCOUNTER — Other Ambulatory Visit: Payer: Self-pay

## 2018-09-28 ENCOUNTER — Other Ambulatory Visit (INDEPENDENT_AMBULATORY_CARE_PROVIDER_SITE_OTHER): Payer: 59

## 2018-09-28 DIAGNOSIS — E0590 Thyrotoxicosis, unspecified without thyrotoxic crisis or storm: Secondary | ICD-10-CM

## 2018-09-28 LAB — T4, FREE: Free T4: 0.84 ng/dL (ref 0.60–1.60)

## 2018-09-28 LAB — TSH: TSH: 0.25 u[IU]/mL — ABNORMAL LOW (ref 0.35–4.50)

## 2018-09-29 LAB — T3: T3, Total: 96 ng/dL (ref 71–180)

## 2018-10-01 ENCOUNTER — Encounter: Payer: Self-pay | Admitting: Endocrinology

## 2018-10-01 ENCOUNTER — Ambulatory Visit (INDEPENDENT_AMBULATORY_CARE_PROVIDER_SITE_OTHER): Payer: 59 | Admitting: Endocrinology

## 2018-10-01 ENCOUNTER — Other Ambulatory Visit: Payer: Self-pay

## 2018-10-01 VITALS — BP 150/86 | HR 75 | Ht 67.0 in | Wt 225.0 lb

## 2018-10-01 DIAGNOSIS — R7989 Other specified abnormal findings of blood chemistry: Secondary | ICD-10-CM | POA: Diagnosis not present

## 2018-10-01 DIAGNOSIS — E041 Nontoxic single thyroid nodule: Secondary | ICD-10-CM | POA: Diagnosis not present

## 2018-10-01 NOTE — Progress Notes (Signed)
Patient ID: Eric Dillon, male   DOB: Jun 20, 1955, 63 y.o.   MRN: [id number]                                                                                                               Reason for Appointment: Follow-up for thyroid   Referring healthcare provider:   Chief complaint: Abnormal thyroid test   History of Present Illness:   Problem 1: Abnormal thyroid tests  The patient had a routine TSH tested in 08/2017 on his general exam with his PCP This was low at 0.17 and follow-up thyroid panel in 8/19 again showed a low TSH with normal T4 and T3 levels  He has not had any symptoms of palpitations, shakiness, heat intolerance or unusual weight loss He was last seen about 6 months ago  Currently his thyroid levels are improving with TSH 0.25 His free T4 and free T3 are normal and slightly lower  Wt Readings from Last 3 Encounters:  09/11/18 225 lb (102.1 kg)  04/01/18 229 lb (103.9 kg)  03/26/18 229 lb 9.6 oz (104.1 kg)     Thyroid function tests as follows:     Lab Results  Component Value Date   FREET4 0.84 09/28/2018   FREET4 0.94 09/11/2018   FREET4 0.89 03/11/2018   T3FREE 3.4 09/11/2018   T3FREE 3.6 03/11/2018   T3FREE 3.3 11/05/2017   TSH 0.25 (L) 09/28/2018   TSH 0.18 (L) 09/11/2018   TSH 0.18 (L) 03/11/2018   No results found for: THYROTRECAB   PROBLEM 2: THYROID NODULE  In 2007 he was having an MRI with angiogram of his neck and a right-sided thyroid nodule was incidentally discovered Ultrasound showed a large 8 cm right sided thyroid nodule  On biopsy this was benign with cytology showing colloid mixed with scant follicular epithelium He has had periodic ultrasound exams subsequently with the last exam in 2015 showing no change in the thyroid nodule which was about 8 cm in size  He does not complain of any local pressure, discomfort on swallowing or choking feeling   Allergies as of 10/01/2018      Reactions   Amoxicillin    REACTION: hives    Codeine    REACTION: rash   Sulfonamide Derivatives    REACTION: angioedema   Erythromycin    REACTION: ? reaction      Medication List       Accurate as of October 01, 2018  2:44 PM. If you have any questions, ask your nurse or doctor.        aspirin EC 81 MG tablet Take 81 mg by mouth daily.   atorvastatin 40 MG tablet Commonly known as: LIPITOR TAKE 1 TABLET DAILY   esomeprazole 40 MG capsule Commonly known as: NEXIUM Take 1 capsule (40 mg total) by mouth daily before breakfast. What changed:   how much to take  when to take this  additional instructions   ramipril 2.5 MG capsule Commonly known as: ALTACE TAKE 1 CAPSULE DAILY  triamcinolone cream 0.1 % Commonly known as: KENALOG Apply 1 application topically 2 (two) times daily.   Valtrex 1000 MG tablet Generic drug: valACYclovir Take 1,000 mg by mouth daily.   verapamil 240 MG CR tablet Commonly known as: CALAN-SR TAKE 1 TABLET DAILY           Past Medical History:  Diagnosis Date  . Barrett's esophagus - RESOLVED 2008   RESOLVED ON SUBSEQUENT EXAMS  . Carotid artery dissection ([place]) 2011   L  . Diverticulosis   . GERD (gastroesophageal reflux disease)   . Goiter   . Hemorrhoids   . Horner's syndrome 2012  . Hyperlipidemia   . Hypertension   . Personal history of colonic adenomas 01/14/2008  . Prostate cancer ([place]) 2010  . Stroke ([place]) 1996   FROM DISSECTING R CAROID ARTERY     Past Surgical History:  Procedure Laterality Date  . BIOPSY THYROID  2007   colloid  admixed with scant follicular epithelium  . CAROTID ARTERY ANGIOPLASTY  2011   L  . COLONOSCOPY    . COLONOSCOPY W/ BIOPSIES AND POLYPECTOMY  multiple   adenomatous polyps, internal hemorrhoids, diverticulosis  . CORNEAL TRANSPLANT Left 2015    corneal pannus & perforation,[facility]  . DISSECTION OF CAROID ARTERY     WITH CVA  . KNEE SURGERY     RIGHT..TO STRIGHTEN PATELLA  . ROBOTIC PROSTATECTOMY  2011   Dr Dutch Gray  .  UPPER GASTROINTESTINAL ENDOSCOPY  multiple   w/dilation, Barrett's , esophageal ring, hiatal hernia, gastritis    Family History  Problem Relation Age of Onset  . Diabetes Mother   . Stroke Father 33       CVA,BLOOD CLOT TO BRAIN  . CAD Father        BYPASS  . Stroke Paternal Grandmother 81       MULTIPLE CVA'S; onset late70s  . Cancer Neg Hx   . Colon cancer Neg Hx   . Esophageal cancer Neg Hx   . Rectal cancer Neg Hx   . Stomach cancer Neg Hx     Social History:  reports that he quit smoking about 8 years ago. He has never used smokeless tobacco. He reports current alcohol use of about 21.0 - 28.0 standard drinks of alcohol per week. He reports that he does not use drugs.  Allergies:  Allergies  Allergen Reactions  . Amoxicillin     REACTION: hives  . Codeine     REACTION: rash  . Sulfonamide Derivatives     REACTION: angioedema  . Erythromycin     REACTION: ? reaction     Review of Systems       Examination:   There were no vitals taken for this visit.  He looks well The thyroid is palpable on the right side, mostly on swallowing  The thyroid enlargement is about 4 cm across, relatively soft and smooth and not very prominent Left lobe is not palpable  No tremor present    Assessment/Plan:  He has a multinodular goiter with autonomous function   Has been asymptomatic Now his TSH is not as suppressed and only 0.25 compared to previous lower levels  Most likely he has slow progression of his autonomous thyroid function and can be followed annually  Long standing 8 cm right thyroid nodule, likely to be a colloid nodule which has been previously benign on biopsy and stable on subsequent ultrasound exams as of 2015  His exam today shows a relatively  soft right thyroid nodule which is not very prominent and may be clinically smaller also He has no local pressure symptoms He will follow-up for exam in a year again    Elayne Snare 10/01/2018, 2:44 PM     Note: This office note was prepared with Dragon voice recognition system technology. Any transcriptional errors that result from this process are unintentional.

## 2018-11-23 ENCOUNTER — Other Ambulatory Visit: Payer: Self-pay

## 2018-11-23 MED ORDER — VERAPAMIL HCL ER 240 MG PO TBCR: 240.0000 mg | EXTENDED_RELEASE_TABLET | Freq: Every day | ORAL | 0 refills | Status: DC

## 2018-11-23 MED ORDER — ATORVASTATIN CALCIUM 40 MG PO TABS: 40.0000 mg | ORAL_TABLET | Freq: Every day | ORAL | 0 refills | Status: DC

## 2018-11-23 MED ORDER — RAMIPRIL 2.5 MG PO CAPS: 2.5000 mg | ORAL_CAPSULE | Freq: Every day | ORAL | 0 refills | Status: DC

## 2019-02-16 ENCOUNTER — Other Ambulatory Visit: Payer: Self-pay | Admitting: Internal Medicine

## 2019-03-14 NOTE — Progress Notes (Signed)
Subjective:    Patient ID: Eric Dillon, male    DOB: 1955-05-17, 63 y.o.   MRN: [id number]  HPI The patient is here for follow up.   He is exercising regularly, but could do more.    Hypertension: He is taking his medication daily. He is compliant with a low sodium diet.  He denies chest pain, palpitations, edema, shortness of breath and regular headaches.  He does not monitor his blood pressure at home.    Diabetes: He is taking his medication daily as prescribed. He is compliant with a diabetic diet. He checks his feet daily and denies foot lesions. He is up-to-date with an ophthalmology examination.   Hyperlipidemia: He is taking his medication daily. He is compliant with a low fat/cholesterol diet. He denies myalgias.   GERD:  He is taking his medication daily as prescribed.  He denies any GERD symptoms and feels his GERD is well controlled.    Medications and allergies reviewed with patient and updated if appropriate.  Patient Active Problem List   Diagnosis Date Noted  . Subclinical hyperthyroidism 03/10/2018  . Skin cancer 09/09/2017  . Herpes zoster without complication [id number]  . Diabetes ([place]) 06/08/2015  . H/O cornea transplant, left 05/30/2015  . Psoriasis 12/08/2014  . Nonspecific abnormal electrocardiogram (ECG) (EKG) 03/23/2014  . 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 Dissection of carotid artery ([place]) 01/19/2010  . 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 PROSTATE CANCER, HX OF 12/28/2008  . Personal history of colonic adenomas 01/14/2008  . HYPERLIPIDEMIA 03/03/2007  . Essential hypertension 03/03/2007  . GERD with stricture 03/03/2007  . CEREBROVASCULAR ACCIDENT, HX OF 03/03/2007    Current Outpatient Medications on File Prior to Visit  Medication Sig Dispense Refill  . aspirin EC 81 MG tablet Take 81 mg by mouth daily.    Marland Kitchen atorvastatin (LIPITOR) 40 MG tablet TAKE 1 TABLET DAILY 90 tablet 0  . esomeprazole (NEXIUM) 40 MG capsule Take 1  capsule (40 mg total) by mouth daily before breakfast. (Patient taking differently: Take 20 mg by mouth daily. Take 2 tablets daily) 90 capsule 3  . ramipril (ALTACE) 2.5 MG capsule TAKE 1 CAPSULE DAILY 90 capsule 0  . triamcinolone cream (KENALOG) 0.1 % Apply 1 application topically 2 (two) times daily. 30 g 0  . valACYclovir (VALTREX) 1000 MG tablet Take 1,000 mg by mouth daily.    . verapamil (CALAN-SR) 240 MG CR tablet TAKE 1 TABLET DAILY 90 tablet 0   No current facility-administered medications on file prior to visit.    Past Medical History:  Diagnosis Date  . Barrett's esophagus - RESOLVED 2008   RESOLVED ON SUBSEQUENT EXAMS  . Carotid artery dissection ([place]) 2011   L  . Diverticulosis   . GERD (gastroesophageal reflux disease)   . Goiter   . Hemorrhoids   . Horner's syndrome 2012  . Hyperlipidemia   . Hypertension   . Personal history of colonic adenomas 01/14/2008  . Prostate cancer ([place]) 2010  . Stroke ([place]) 1996   FROM DISSECTING R CAROID ARTERY     Past Surgical History:  Procedure Laterality Date  . BIOPSY THYROID  2007   colloid  admixed with scant follicular epithelium  . CAROTID ARTERY ANGIOPLASTY  2011   L  . COLONOSCOPY    . COLONOSCOPY W/ BIOPSIES AND POLYPECTOMY  multiple   adenomatous polyps, internal hemorrhoids, diverticulosis  . CORNEAL TRANSPLANT Left 2015    corneal pannus & perforation,[facility]  .  DISSECTION OF CAROID ARTERY     WITH CVA  . KNEE SURGERY     RIGHT..TO STRIGHTEN PATELLA  . ROBOTIC PROSTATECTOMY  2011   Dr Dutch Gray  . UPPER GASTROINTESTINAL ENDOSCOPY  multiple   w/dilation, Barrett's , esophageal ring, hiatal hernia, gastritis    Social History   Socioeconomic History  . Marital status: Married    Spouse name: Not on file  . Number of children: 2  . Years of education: Not on file  . Highest education level: Not on file  Occupational History  . Occupation: Analyst  Tobacco Use  . Smoking status: Former Smoker     Quit date: 01/23/2010    Years since quitting: 9.1  . Smokeless tobacco: Never Used  . Tobacco comment: smoked 1974-2011, up to 1 ppd  Substance and Sexual Activity  . Alcohol use: Yes    Alcohol/week: 21.0 - 28.0 standard drinks    Types: 21 - 28 Cans of beer per week  . Drug use: No  . Sexual activity: Not on file  Other Topics Concern  . Not on file  Social History Narrative   Gets reg exercise   Social Determinants of Health   Financial Resource Strain:   . Difficulty of Paying Living Expenses: Not on file  Food Insecurity:   . Worried About Charity fundraiser in the Last Year: Not on file  . Ran Out of Food in the Last Year: Not on file  Transportation Needs:   . Lack of Transportation (Medical): Not on file  . Lack of Transportation (Non-Medical): Not on file  Physical Activity:   . Days of Exercise per Week: Not on file  . Minutes of Exercise per Session: Not on file  Stress:   . Feeling of Stress : Not on file  Social Connections:   . Frequency of Communication with Friends and Family: Not on file  . Frequency of Social Gatherings with Friends and Family: Not on file  . Attends Religious Services: Not on file  . Active Member of Clubs or Organizations: Not on file  . Attends Archivist Meetings: Not on file  . Marital Status: Not on file    Family History  Problem Relation Age of Onset  . Diabetes Mother   . Stroke Father 13       CVA,BLOOD CLOT TO BRAIN  . CAD Father        BYPASS  . Stroke Paternal Grandmother 81       MULTIPLE CVA'S; onset late70s  . Cancer Neg Hx   . Colon cancer Neg Hx   . Esophageal cancer Neg Hx   . Rectal cancer Neg Hx   . Stomach cancer Neg Hx     Review of Systems  Constitutional: Negative for chills and fever.  Respiratory: Negative for cough, shortness of breath and wheezing.   Cardiovascular: Negative for chest pain, palpitations and leg swelling.  Neurological: Negative for dizziness, light-headedness,  numbness and headaches.       Objective:   Vitals:   03/15/19 1334  BP: (!) 146/84  Pulse: 60  Resp: 16  Temp: 97.9 F (36.6 C)  SpO2: 98%   BP Readings from Last 3 Encounters:  03/15/19 (!) 146/84  10/01/18 (!) 150/86  09/11/18 140/78   Wt Readings from Last 3 Encounters:  03/15/19 229 lb (103.9 kg)  10/01/18 225 lb (102.1 kg)  09/11/18 225 lb (102.1 kg)   Body mass index is 35.87 kg/m.  Physical Exam    Constitutional: Appears well-developed and well-nourished. No distress.  HENT:  Head: Normocephalic and atraumatic.  Neck: Neck supple. No tracheal deviation present. No thyromegaly present.  No cervical lymphadenopathy Cardiovascular: Normal rate, regular rhythm and normal heart sounds.   No murmur heard. No carotid bruit .  No edema Pulmonary/Chest: Effort normal and breath sounds normal. No respiratory distress. No has no wheezes. No rales.  Skin: Skin is warm and dry. Not diaphoretic.  Psychiatric: Normal mood and affect. Behavior is normal.      Assessment & Plan:    See Problem List for Assessment and Plan of chronic medical problems.    This visit occurred during the SARS-CoV-2 public health emergency.  Safety protocols were in place, including screening questions prior to the visit, additional usage of staff PPE, and extensive cleaning of exam room while observing appropriate contact time as indicated for disinfecting solutions.

## 2019-03-14 NOTE — Patient Instructions (Addendum)
  Tests ordered today. Your results will be released to [place] (or called to you) after review.  If any changes need to be made, you will be notified at that same time.   Medications reviewed and updated.  Changes include :   Start hydrochlorothiazide 12.5 mg daily  Your prescription(s) have been submitted to your pharmacy. Please take as directed and contact our office if you believe you are having problem(s) with the medication(s).  Work on weight loss - increase your exercise and decrease your portions.    Please followup in 6 months

## 2019-03-15 ENCOUNTER — Other Ambulatory Visit: Payer: Self-pay

## 2019-03-15 ENCOUNTER — Ambulatory Visit (INDEPENDENT_AMBULATORY_CARE_PROVIDER_SITE_OTHER): Payer: 59 | Admitting: Internal Medicine

## 2019-03-15 ENCOUNTER — Encounter: Payer: Self-pay | Admitting: Internal Medicine

## 2019-03-15 VITALS — BP 146/84 | HR 60 | Temp 97.9°F | Resp 16 | Ht 67.0 in | Wt 229.0 lb

## 2019-03-15 DIAGNOSIS — E782 Mixed hyperlipidemia: Secondary | ICD-10-CM

## 2019-03-15 DIAGNOSIS — E119 Type 2 diabetes mellitus without complications: Secondary | ICD-10-CM | POA: Diagnosis not present

## 2019-03-15 DIAGNOSIS — K219 Gastro-esophageal reflux disease without esophagitis: Secondary | ICD-10-CM | POA: Diagnosis not present

## 2019-03-15 DIAGNOSIS — I10 Essential (primary) hypertension: Secondary | ICD-10-CM

## 2019-03-15 DIAGNOSIS — K222 Esophageal obstruction: Secondary | ICD-10-CM

## 2019-03-15 LAB — COMPREHENSIVE METABOLIC PANEL
ALT: 49 U/L (ref 0–53)
AST: 28 U/L (ref 0–37)
Albumin: 4 g/dL (ref 3.5–5.2)
Alkaline Phosphatase: 70 U/L (ref 39–117)
BUN: 14 mg/dL (ref 6–23)
CO2: 27 mEq/L (ref 19–32)
Calcium: 9.4 mg/dL (ref 8.4–10.5)
Chloride: 101 mEq/L (ref 96–112)
Creatinine, Ser: 0.82 mg/dL (ref 0.40–1.50)
GFR: 95.11 mL/min (ref >60.00)
Glucose, Bld: 112 mg/dL — ABNORMAL HIGH (ref 70–99)
Potassium: 4.5 mEq/L (ref 3.5–5.1)
Sodium: 138 mEq/L (ref 135–145)
Total Bilirubin: 0.6 mg/dL (ref 0.2–1.2)
Total Protein: 6.5 g/dL (ref 6.0–8.3)

## 2019-03-15 LAB — LIPID PANEL
Cholesterol: 185 mg/dL (ref 0–200)
HDL: 70.6 mg/dL (ref >39.00)
LDL Cholesterol: 80 mg/dL (ref 0–99)
NonHDL: 114.65
Total CHOL/HDL Ratio: 3
Triglycerides: 172 mg/dL — ABNORMAL HIGH (ref 0.0–149.0)
VLDL: 34.4 mg/dL (ref 0.0–40.0)

## 2019-03-15 LAB — HEMOGLOBIN A1C: Hgb A1c MFr Bld: 6 % (ref 4.6–6.5)

## 2019-03-15 MED ORDER — HYDROCHLOROTHIAZIDE 12.5 MG PO CAPS: 12.5000 mg | ORAL_CAPSULE | Freq: Every day | ORAL | 5 refills | Status: DC

## 2019-03-15 NOTE — Assessment & Plan Note (Signed)
Check A1c Currently diet controlled Stressed the importance of regular exercise and weight loss Low sugar/carb diet

## 2019-03-15 NOTE — Assessment & Plan Note (Addendum)
BP at home 140/84, sometimes 150-160/105 Blood pressure not well controlled Start hydrochlorothiazide 12.5 mg daily Continue current dose of ramipril 2.5 mg-higher dose in the past has caused hyperkalemia Continue current dose of verapamil CMP Stressed regular exercise, weight loss and low-sodium diet to avoid more medication Advised him to monitor his blood pressure at home and update me with his readings

## 2019-03-15 NOTE — Assessment & Plan Note (Signed)
Check lipid panel  Continue daily statin Regular exercise and healthy diet encouraged  

## 2019-03-15 NOTE — Assessment & Plan Note (Signed)
GERD controlled Continue daily medication  

## 2019-03-20 ENCOUNTER — Encounter: Payer: Self-pay | Admitting: Internal Medicine

## 2019-05-12 ENCOUNTER — Other Ambulatory Visit: Payer: Self-pay

## 2019-05-12 MED ORDER — HYDROCHLOROTHIAZIDE 12.5 MG PO CAPS: 12.5000 mg | ORAL_CAPSULE | Freq: Every day | ORAL | 0 refills | Status: DC

## 2019-05-28 ENCOUNTER — Other Ambulatory Visit: Payer: Self-pay | Admitting: Internal Medicine

## 2019-08-20 ENCOUNTER — Other Ambulatory Visit: Payer: Self-pay | Admitting: Internal Medicine

## 2019-09-13 ENCOUNTER — Ambulatory Visit: Payer: 59 | Admitting: Internal Medicine

## 2019-09-13 NOTE — Patient Instructions (Addendum)
°  Blood work was ordered.     Medications reviewed and updated.  Changes include :  none   A referral was ordered for cardiology.    Please followup in 6 months

## 2019-09-13 NOTE — Progress Notes (Signed)
Subjective:    Patient ID: Eric Dillon, male    DOB: Mar 05, 1956, 64 y.o.   MRN: [id number]  HPI The patient is here for follow up of their chronic medical problems, including htn, DM, hyperlipidemia, GERD  He is taking all of his medications as prescribed.     He is exercising more than he was.   He is walking more and does some kayaking.  He is compliant with a diabetic diet.  He has umbilical hernia and sometimes it pops out and it hard.    If he really exerts himself he gets winded.  He noticed this in the past 6-8 months.  It has not progressed since he noticed.     Medications and allergies reviewed with patient and updated if appropriate.  Patient Active Problem List   Diagnosis Date Noted  . Subclinical hyperthyroidism 03/10/2018  . Skin cancer 09/09/2017  . Herpes zoster without complication 69/62/9528  . Diabetes ([place]) 06/08/2015  . H/O cornea transplant, left 05/30/2015  . Psoriasis 12/08/2014  . Nonspecific abnormal electrocardiogram (ECG) (EKG) 03/23/2014  . 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 Dissection of carotid artery ([place]) 01/19/2010  . 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 PROSTATE CANCER, HX OF 12/28/2008  . Personal history of colonic adenomas 01/14/2008  . HYPERLIPIDEMIA 03/03/2007  . Essential hypertension 03/03/2007  . GERD with stricture 03/03/2007  . CEREBROVASCULAR ACCIDENT, HX OF 03/03/2007    Current Outpatient Medications on File Prior to Visit  Medication Sig Dispense Refill  . aspirin EC 81 MG tablet Take 81 mg by mouth daily.    Marland Kitchen atorvastatin (LIPITOR) 40 MG tablet TAKE 1 TABLET DAILY 90 tablet 0  . esomeprazole (NEXIUM) 40 MG capsule Take 1 capsule (40 mg total) by mouth daily before breakfast. (Patient taking differently: Take 20 mg by mouth daily. Take 2 tablets daily) 90 capsule 3  . hydrochlorothiazide (MICROZIDE) 12.5 MG capsule Take 1 capsule (12.5 mg total) by mouth daily. 90 capsule 0  . prednisoLONE acetate (PRED  FORTE) 1 % ophthalmic suspension Place 1 drop into the left eye daily.    . ramipril (ALTACE) 2.5 MG capsule TAKE 1 CAPSULE DAILY 90 capsule 0  . triamcinolone cream (KENALOG) 0.1 % Apply 1 application topically 2 (two) times daily. 30 g 0  . valACYclovir (VALTREX) 1000 MG tablet Take 1,000 mg by mouth daily.    . verapamil (CALAN-SR) 240 MG CR tablet TAKE 1 TABLET DAILY 90 tablet 0   No current facility-administered medications on file prior to visit.    Past Medical History:  Diagnosis Date  . Barrett's esophagus - RESOLVED 2008   RESOLVED ON SUBSEQUENT EXAMS  . Carotid artery dissection ([place]) 2011   L  . Diverticulosis   . GERD (gastroesophageal reflux disease)   . Goiter   . Hemorrhoids   . Horner's syndrome 2012  . Hyperlipidemia   . Hypertension   . Personal history of colonic adenomas 01/14/2008  . Prostate cancer ([place]) 2010  . Stroke ([place]) 1996   FROM DISSECTING R CAROID ARTERY     Past Surgical History:  Procedure Laterality Date  . BIOPSY THYROID  2007   colloid  admixed with scant follicular epithelium  . CAROTID ARTERY ANGIOPLASTY  2011   L  . COLONOSCOPY    . COLONOSCOPY W/ BIOPSIES AND POLYPECTOMY  multiple   adenomatous polyps, internal hemorrhoids, diverticulosis  . CORNEAL TRANSPLANT Left 2015    corneal pannus & perforation,[facility]  . DISSECTION  OF CAROID ARTERY     WITH CVA  . KNEE SURGERY     RIGHT..TO STRIGHTEN PATELLA  . ROBOTIC PROSTATECTOMY  2011   Dr Dutch Gray  . UPPER GASTROINTESTINAL ENDOSCOPY  multiple   w/dilation, Barrett's , esophageal ring, hiatal hernia, gastritis    Social History   Socioeconomic History  . Marital status: Married    Spouse name: Not on file  . Number of children: 2  . Years of education: Not on file  . Highest education level: Not on file  Occupational History  . Occupation: Analyst  Tobacco Use  . Smoking status: Former Smoker    Quit date: 01/23/2010    Years since quitting: 9.6  . Smokeless tobacco:  Never Used  . Tobacco comment: smoked 1974-2011, up to 1 ppd  Substance and Sexual Activity  . Alcohol use: Yes    Alcohol/week: 21.0 - 28.0 standard drinks    Types: 21 - 28 Cans of beer per week  . Drug use: No  . Sexual activity: Not on file  Other Topics Concern  . Not on file  Social History Narrative   Gets reg exercise   Social Determinants of Health   Financial Resource Strain:   . Difficulty of Paying Living Expenses:   Food Insecurity:   . Worried About Charity fundraiser in the Last Year:   . Arboriculturist in the Last Year:   Transportation Needs:   . Film/video editor (Medical):   Marland Kitchen Lack of Transportation (Non-Medical):   Physical Activity:   . Days of Exercise per Week:   . Minutes of Exercise per Session:   Stress:   . Feeling of Stress :   Social Connections:   . Frequency of Communication with Friends and Family:   . Frequency of Social Gatherings with Friends and Family:   . Attends Religious Services:   . Active Member of Clubs or Organizations:   . Attends Archivist Meetings:   Marland Kitchen Marital Status:     Family History  Problem Relation Age of Onset  . Diabetes Mother   . Stroke Father 43       CVA,BLOOD CLOT TO BRAIN  . CAD Father        BYPASS  . Stroke Paternal Grandmother 81       MULTIPLE CVA'S; onset late70s  . Cancer Neg Hx   . Colon cancer Neg Hx   . Esophageal cancer Neg Hx   . Rectal cancer Neg Hx   . Stomach cancer Neg Hx     Review of Systems  Constitutional: Negative for chills and fever.  Respiratory: Positive for cough and shortness of breath (with moderate exertion). Negative for wheezing.   Cardiovascular: Negative for chest pain, palpitations and leg swelling.  Neurological: Negative for light-headedness and headaches.       Objective:   Vitals:   09/14/19 1307  BP: 136/70  Pulse: (!) 7  Temp: 98.1 F (36.7 C)  SpO2: 98%   BP Readings from Last 3 Encounters:  09/14/19 136/70  03/15/19 (!)  146/84  10/01/18 (!) 150/86   Wt Readings from Last 3 Encounters:  09/14/19 224 lb (101.6 kg)  03/15/19 229 lb (103.9 kg)  10/01/18 225 lb (102.1 kg)   Body mass index is 35.08 kg/m.   Physical Exam    Constitutional: Appears well-developed and well-nourished. No distress.  HENT:  Head: Normocephalic and atraumatic.  Neck: Neck supple. No tracheal deviation present.  No thyromegaly present.  No cervical lymphadenopathy Cardiovascular: Normal rate, regular rhythm and normal heart sounds.   No murmur heard. No carotid bruit .  No edema Pulmonary/Chest: Effort normal and breath sounds normal. No respiratory distress. No has no wheezes. No rales.  Abd: umbilical hernia - nontender Skin: Skin is warm and dry. Not diaphoretic.  Psychiatric: Normal mood and affect. Behavior is normal.      Assessment & Plan:    See Problem List for Assessment and Plan of chronic medical problems.    This visit occurred during the SARS-CoV-2 public health emergency.  Safety protocols were in place, including screening questions prior to the visit, additional usage of staff PPE, and extensive cleaning of exam room while observing appropriate contact time as indicated for disinfecting solutions.

## 2019-09-14 ENCOUNTER — Encounter: Payer: Self-pay | Admitting: Internal Medicine

## 2019-09-14 ENCOUNTER — Other Ambulatory Visit: Payer: Self-pay

## 2019-09-14 ENCOUNTER — Ambulatory Visit (INDEPENDENT_AMBULATORY_CARE_PROVIDER_SITE_OTHER): Payer: 59 | Admitting: Internal Medicine

## 2019-09-14 VITALS — BP 136/70 | HR 7 | Temp 98.1°F | Ht 67.0 in | Wt 224.0 lb

## 2019-09-14 DIAGNOSIS — I10 Essential (primary) hypertension: Secondary | ICD-10-CM | POA: Diagnosis not present

## 2019-09-14 DIAGNOSIS — K219 Gastro-esophageal reflux disease without esophagitis: Secondary | ICD-10-CM

## 2019-09-14 DIAGNOSIS — R0609 Other forms of dyspnea: Secondary | ICD-10-CM

## 2019-09-14 DIAGNOSIS — K429 Umbilical hernia without obstruction or gangrene: Secondary | ICD-10-CM

## 2019-09-14 DIAGNOSIS — K222 Esophageal obstruction: Secondary | ICD-10-CM

## 2019-09-14 DIAGNOSIS — E119 Type 2 diabetes mellitus without complications: Secondary | ICD-10-CM

## 2019-09-14 DIAGNOSIS — I773 Arterial fibromuscular dysplasia: Secondary | ICD-10-CM

## 2019-09-14 DIAGNOSIS — Z8546 Personal history of malignant neoplasm of prostate: Secondary | ICD-10-CM

## 2019-09-14 DIAGNOSIS — E782 Mixed hyperlipidemia: Secondary | ICD-10-CM | POA: Diagnosis not present

## 2019-09-14 DIAGNOSIS — R0600 Dyspnea, unspecified: Secondary | ICD-10-CM

## 2019-09-14 LAB — COMPREHENSIVE METABOLIC PANEL
ALT: 66 U/L — ABNORMAL HIGH (ref 0–53)
AST: 40 U/L — ABNORMAL HIGH (ref 0–37)
Albumin: 4.2 g/dL (ref 3.5–5.2)
Alkaline Phosphatase: 86 U/L (ref 39–117)
BUN: 17 mg/dL (ref 6–23)
CO2: 28 mEq/L (ref 19–32)
Calcium: 9.8 mg/dL (ref 8.4–10.5)
Chloride: 100 mEq/L (ref 96–112)
Creatinine, Ser: 0.84 mg/dL (ref 0.40–1.50)
GFR: 92.35 mL/min (ref >60.00)
Glucose, Bld: 193 mg/dL — ABNORMAL HIGH (ref 70–99)
Potassium: 3.8 mEq/L (ref 3.5–5.1)
Sodium: 135 mEq/L (ref 135–145)
Total Bilirubin: 0.5 mg/dL (ref 0.2–1.2)
Total Protein: 6.9 g/dL (ref 6.0–8.3)

## 2019-09-14 LAB — LIPID PANEL
Cholesterol: 169 mg/dL (ref 0–200)
HDL: 60.8 mg/dL (ref >39.00)
NonHDL: 107.73
Total CHOL/HDL Ratio: 3
Triglycerides: 255 mg/dL — ABNORMAL HIGH (ref 0.0–149.0)
VLDL: 51 mg/dL — ABNORMAL HIGH (ref 0.0–40.0)

## 2019-09-14 LAB — LDL CHOLESTEROL, DIRECT: Direct LDL: 80 mg/dL

## 2019-09-14 LAB — HEMOGLOBIN A1C: Hgb A1c MFr Bld: 6.3 % (ref 4.6–6.5)

## 2019-09-14 NOTE — Assessment & Plan Note (Signed)
Chronic Diet controlled Check a1c Low sugar / carb diet Stressed regular exercise, weight loss

## 2019-09-14 NOTE — Assessment & Plan Note (Addendum)
Chronic No GERD and No dysphagia Continue daily PPI

## 2019-09-14 NOTE — Assessment & Plan Note (Signed)
Chronic BP well controlled Current regimen effective and well tolerated Continue current medications at current doses cmp  

## 2019-09-14 NOTE — Assessment & Plan Note (Signed)
Chronic Check lipid panel  Continue daily statin Regular exercise and healthy diet encouraged  

## 2019-09-14 NOTE — Assessment & Plan Note (Signed)
Chronic H/o of b/l ICA dissection in past

## 2019-09-14 NOTE — Assessment & Plan Note (Signed)
Acute With moderate exertion x several months, not progressive ? Related to deconditioning / weight or angina Will refer to cardio for further evaluation especially given his risk factors

## 2019-09-14 NOTE — Assessment & Plan Note (Signed)
No longer seeing urology I will check psa annually ordered

## 2019-09-14 NOTE — Assessment & Plan Note (Signed)
No pain Chronic Will monitor for now - if it starts causing pain will need to see surgery advised weight loss

## 2019-09-15 LAB — PSA, TOTAL AND FREE
PSA, % Free: UNDETERMINED % (calc) (ref >25)
PSA, Free: <0.1 ng/mL
PSA, Total: <0.1 ng/mL (ref <=4.0)

## 2019-09-22 NOTE — Progress Notes (Signed)
Cardiology Office Note:   Date:  09/23/2019  NAME:  Eric Dillon    MRN: [id number] DOB:  1955-10-19   PCP:  Binnie Rail, MD  Cardiologist:  No primary care provider on file.  Electrophysiologist:  None   Referring MD: Binnie Rail, MD   Chief Complaint  Patient presents with  . Shortness of Breath   History of Present Illness:   Eric Dillon is a 64 y.o. male with a hx of right carotid artery dissection, FMD?,  Hyperlipidemia, diabetes who is being seen today for the evaluation of shortness of breath at the request of Burns, Claudina Lick, MD.  He reports for the last 6 to 8 months he has noticed that when he does any rather strenuous activity he gets short of breath.  Despite this he can walk on level surface up to 2 miles without any major limitations.  He reports that when he has to carry heavy objects or do things that are quite strenuous he notices he is quite winded.  He denies any chest pain with any of this activity.  He reports that he feels his heart racing he does gets profoundly out of breath.  Symptoms are resolved with cessation of activity.  He is obese with a BMI of 33.  He reports that he recently retired.  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  He is starting to get back into more activity and noticed he is getting winded.  He does have a likely diagnosis of FMD.  He had right internal carotid artery dissection in 1997 2011.  He has had no coronary involvement or any involvement in the renal arteries that I can tell.  His blood pressure is 140/86.  He does take blood pressure medication.  No major issues controlling his blood pressure.  His most recent lipid profile shows his LDL cholesterol is 80.  I suspect he was not fasting.  He does not drink alcohol daily 1-2 beers.  He may drink 1 the weekends.  He is a former smoker.  He smoked nearly 30 years of cigarettes.  He does vape currently.  He reports heart disease in his father.  He has never had a  heart attack.  Problem List 1. R ICA dissection (FMD)/CVA -1997 -2011 2. HLD -T chol 169, LDL 80, TG 255, HDL 61 3. DM -A1c 6.3   Past Medical History: Past Medical History:  Diagnosis Date  . Barrett's esophagus - RESOLVED 2008   RESOLVED ON SUBSEQUENT EXAMS  . Carotid artery dissection ([place]) 2011   L  . Diverticulosis   . GERD (gastroesophageal reflux disease)   . Goiter   . Hemorrhoids   . Horner's syndrome 2012  . Hyperlipidemia   . Hypertension   . Personal history of colonic adenomas 01/14/2008  . Prostate cancer ([place]) 2010  . Stroke ([place]) 1996   FROM DISSECTING R CAROID ARTERY     Past Surgical History: Past Surgical History:  Procedure Laterality Date  . BIOPSY THYROID  2007   colloid  admixed with scant follicular epithelium  . CAROTID ARTERY ANGIOPLASTY  2011   L  . COLONOSCOPY    . COLONOSCOPY W/ BIOPSIES AND POLYPECTOMY  multiple   adenomatous polyps, internal hemorrhoids, diverticulosis  . CORNEAL TRANSPLANT Left 2015    corneal pannus & perforation,[facility]  . DISSECTION OF CAROID ARTERY     WITH CVA  . KNEE SURGERY     RIGHT..TO  STRIGHTEN PATELLA  . ROBOTIC PROSTATECTOMY  2011   Dr Dutch Gray  . UPPER GASTROINTESTINAL ENDOSCOPY  multiple   w/dilation, Barrett's , esophageal ring, hiatal hernia, gastritis    Current Medications: Current Meds  Medication Sig  . aspirin EC 81 MG tablet Take 81 mg by mouth daily.  Marland Kitchen atorvastatin (LIPITOR) 40 MG tablet TAKE 1 TABLET DAILY  . esomeprazole (NEXIUM) 40 MG capsule Take 1 capsule (40 mg total) by mouth daily before breakfast. (Patient taking differently: Take 20 mg by mouth daily. Take 2 tablets daily)  . hydrochlorothiazide (MICROZIDE) 12.5 MG capsule Take 1 capsule (12.5 mg total) by mouth daily.  . prednisoLONE acetate (PRED FORTE) 1 % ophthalmic suspension Place 1 drop into the left eye daily.  . ramipril (ALTACE) 2.5 MG capsule TAKE 1 CAPSULE DAILY  . triamcinolone cream (KENALOG) 0.1 % Apply 1  application topically 2 (two) times daily.  . valACYclovir (VALTREX) 1000 MG tablet Take 1,000 mg by mouth daily.  . verapamil (CALAN-SR) 240 MG CR tablet TAKE 1 TABLET DAILY     Allergies:    Amoxicillin, Codeine, Sulfonamide derivatives, Erythromycin, and Vitamin b50 complex [balanced b-100]   Social History: Social History   Socioeconomic History  . Marital status: Married    Spouse name: Not on file  . Number of children: 2  . Years of education: Not on file  . Highest education level: Not on file  Occupational History  . Occupation: Analyst  Tobacco Use  . Smoking status: Former Smoker    Years: 30.00    Types: E-cigarettes    Quit date: 01/23/2010    Years since quitting: 9.6  . Smokeless tobacco: Never Used  . Tobacco comment: smoked 1974-2011, up to 1 ppd  Substance and Sexual Activity  . Alcohol use: Yes    Alcohol/week: 21.0 - 28.0 standard drinks    Types: 21 - 28 Cans of beer per week  . Drug use: No  . Sexual activity: Not on file  Other Topics Concern  . Not on file  Social History Narrative   Gets reg exercise   Social Determinants of Health   Financial Resource Strain:   . Difficulty of Paying Living Expenses:   Food Insecurity:   . Worried About Charity fundraiser in the Last Year:   . Arboriculturist in the Last Year:   Transportation Needs:   . Film/video editor (Medical):   Marland Kitchen Lack of Transportation (Non-Medical):   Physical Activity:   . Days of Exercise per Week:   . Minutes of Exercise per Session:   Stress:   . Feeling of Stress :   Social Connections:   . Frequency of Communication with Friends and Family:   . Frequency of Social Gatherings with Friends and Family:   . Attends Religious Services:   . Active Member of Clubs or Organizations:   . Attends Archivist Meetings:   Marland Kitchen Marital Status:      Family History: The patient's family history includes CAD in his father; Diabetes in his mother; Stroke (age of onset:  50) in his father; Stroke (age of onset: 32) in his paternal grandmother. There is no history of Cancer, Colon cancer, Esophageal cancer, Rectal cancer, or Stomach cancer.  ROS:   All other ROS reviewed and negative. Pertinent positives noted in the HPI.     EKGs/Labs/Other Studies Reviewed:   The following studies were personally reviewed by me today:  EKG:  EKG  is ordered today.  The ekg ordered today demonstrates normal sinus rhythm, heart rate 83, left axis deviation, poor R progression likely obesity artifact, and was personally reviewed by me.   Recent Labs: 09/28/2018: TSH 0.25 09/14/2019: ALT 66; BUN 17; Creatinine, Ser 0.84; Potassium 3.8; Sodium 135   Recent Lipid Panel    Component Value Date/Time   CHOL 169 09/14/2019 1331   CHOL 191 06/23/2013 0926   TRIG 255.0 (H) 09/14/2019 1331   TRIG 123 06/23/2013 0926   HDL 60.80 09/14/2019 1331   HDL 71 06/23/2013 0926   CHOLHDL 3 09/14/2019 1331   VLDL 51.0 (H) 09/14/2019 1331   LDLCALC 80 03/15/2019 1411   LDLCALC 95 06/23/2013 0926   LDLDIRECT 80.0 09/14/2019 1331    Physical Exam:   VS:  BP 140/86   Pulse 83   Ht 5\' 8"  (1.727 m)   Wt 222 lb 12.8 oz (101.1 kg)   SpO2 95%   BMI 33.88 kg/m    Wt Readings from Last 3 Encounters:  09/23/19 222 lb 12.8 oz (101.1 kg)  09/14/19 224 lb (101.6 kg)  03/15/19 229 lb (103.9 kg)    General: Well nourished, well developed, in no acute distress Heart: Atraumatic, normal size  Eyes: PEERLA, EOMI  Neck: Supple, no JVD Endocrine: No thryomegaly Cardiac: Normal S1, S2; RRR; no murmurs, rubs, or gallops Lungs: Clear to auscultation bilaterally, no wheezing, rhonchi or rales  Abd: Soft, nontender, no hepatomegaly  Ext: No edema, pulses 2+ Musculoskeletal: No deformities, BUE and BLE strength normal and equal Skin: Warm and dry, no rashes   Neuro: Alert and oriented to person, place, time, and situation, CNII-XII grossly intact, no focal deficits  Psych: Normal mood and affect    ASSESSMENT:   Eric Dillon is a 64 y.o. male who presents for the following: 1. SOB (shortness of breath) on exertion   2. Essential hypertension   3. Mixed hyperlipidemia   4. Tobacco abuse     PLAN:   1. SOB (shortness of breath) on exertion -Intermittent episodes of shortness of breath with very strenuous activity.  Despite this he is able to walk 1 to 2 miles per day without any limitations such as shortness of breath or chest pain.  There is no EKG findings concerning for ischemic changes.  He has no evidence of volume overload on exam.  No murmurs.  No evidence of heart failure.  We will proceed with a BNP and an echocardiogram.  I suspect his symptoms are due to deconditioning.  He recently retired.  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  I have asked him to get more active and to see if his symptoms improve.  I will plan to see him back in 6 months and will reevaluate symptoms.  He does have FMD but there is no association with FMD and coronary artery disease.  This is more likely be associated with artery dissections.  He has had no renal involvement that I can tell.  Seems to be stable from a heart standpoint.  CVD risk factor include obesity and tobacco abuse.  Have asked him to cut back on vaping.  He also should cut back on alcohol and try to lose weight.  He will work on this.  2. Essential hypertension -Stable.  Continue current medications.  3. Mixed hyperlipidemia -Most recent LDL cholesterol 80.  4. Tobacco abuse -Advised to quit vaping  Disposition: Return in about 6 months (around 03/25/2020).  Medication Adjustments/Labs and Tests  Ordered: Current medicines are reviewed at length with the patient today.  Concerns regarding medicines are outlined above.  Orders Placed This Encounter  Procedures  . Brain natriuretic peptide  . EKG 12-Lead  . ECHOCARDIOGRAM COMPLETE   No orders of the defined types were placed in this encounter.   Patient Instructions  Medication  Instructions:  The current medical regimen is effective;  continue present plan and medications.  *If you need a refill on your cardiac medications before your next appointment, please call your pharmacy*   Lab Work: BNP today   If you have labs (blood work) drawn today and your tests are completely normal, you will receive your results only by: Marland Kitchen MyChart Message (if you have MyChart) OR . A paper copy in the mail If you have any lab test that is abnormal or we need to change your treatment, we will call you to review the results.   Testing/Procedures: Echocardiogram - Your physician has requested that you have an echocardiogram. Echocardiography is a painless test that uses sound waves to create images of your heart. It provides your doctor with information about the size and shape of your heart and how well your heart's chambers and valves are working. This procedure takes approximately one hour. There are no restrictions for this procedure. This will be performed at our [facility] location - [address], [address].    Follow-Up: At [facility], you and your health needs are our priority.  As part of our continuing mission to provide you with exceptional heart care, we have created designated Provider Care Teams.  These Care Teams include your primary Cardiologist (physician) and Advanced Practice Providers (APPs -  Physician Assistants and Nurse Practitioners) who all work together to provide you with the care you need, when you need it.  We recommend signing up for the patient portal called "MyChart".  Sign up information is provided on this After Visit Summary.  MyChart is used to connect with patients for Virtual Visits (Telemedicine).  Patients are able to view lab/test results, encounter notes, upcoming appointments, etc.  Non-urgent messages can be sent to your provider as well.   To learn more about what you can do with MyChart, go to [email or web address].    Your next  appointment:   6 month(s)  The format for your next appointment:   In Person  Provider:   Eleonore Chiquito, MD         Signed, Addison Naegeli. Audie Box, [place]  [address] [place], [place] [address] [phone]  09/23/2019 12:03 PM

## 2019-09-23 ENCOUNTER — Ambulatory Visit (INDEPENDENT_AMBULATORY_CARE_PROVIDER_SITE_OTHER): Payer: 59 | Admitting: Cardiovascular Disease

## 2019-09-23 ENCOUNTER — Other Ambulatory Visit: Payer: Self-pay

## 2019-09-23 ENCOUNTER — Encounter: Payer: Self-pay | Admitting: Cardiovascular Disease

## 2019-09-23 VITALS — BP 140/86 | HR 83 | Ht 68.0 in | Wt 222.8 lb

## 2019-09-23 DIAGNOSIS — I10 Essential (primary) hypertension: Secondary | ICD-10-CM | POA: Diagnosis not present

## 2019-09-23 DIAGNOSIS — R0602 Shortness of breath: Secondary | ICD-10-CM

## 2019-09-23 DIAGNOSIS — Z720 Tobacco use: Secondary | ICD-10-CM

## 2019-09-23 DIAGNOSIS — E782 Mixed hyperlipidemia: Secondary | ICD-10-CM | POA: Diagnosis not present

## 2019-09-23 NOTE — Patient Instructions (Signed)
Medication Instructions:  The current medical regimen is effective;  continue present plan and medications.  *If you need a refill on your cardiac medications before your next appointment, please call your pharmacy*   Lab Work: BNP today   If you have labs (blood work) drawn today and your tests are completely normal, you will receive your results only by: Marland Kitchen MyChart Message (if you have MyChart) OR . A paper copy in the mail If you have any lab test that is abnormal or we need to change your treatment, we will call you to review the results.   Testing/Procedures: Echocardiogram - Your physician has requested that you have an echocardiogram. Echocardiography is a painless test that uses sound waves to create images of your heart. It provides your doctor with information about the size and shape of your heart and how well your heart's chambers and valves are working. This procedure takes approximately one hour. There are no restrictions for this procedure. This will be performed at our [facility] location - [address], [address].    Follow-Up: At [facility], you and your health needs are our priority.  As part of our continuing mission to provide you with exceptional heart care, we have created designated Provider Care Teams.  These Care Teams include your primary Cardiologist (physician) and Advanced Practice Providers (APPs -  Physician Assistants and Nurse Practitioners) who all work together to provide you with the care you need, when you need it.  We recommend signing up for the patient portal called "MyChart".  Sign up information is provided on this After Visit Summary.  MyChart is used to connect with patients for Virtual Visits (Telemedicine).  Patients are able to view lab/test results, encounter notes, upcoming appointments, etc.  Non-urgent messages can be sent to your provider as well.   To learn more about what you can do with MyChart, go to [email or web address].     Your next appointment:   6 month(s)  The format for your next appointment:   In Person  Provider:   Eleonore Chiquito, MD

## 2019-09-24 LAB — BRAIN NATRIURETIC PEPTIDE: BNP: 26.9 pg/mL (ref 0.0–100.0)

## 2019-10-04 ENCOUNTER — Ambulatory Visit (INDEPENDENT_AMBULATORY_CARE_PROVIDER_SITE_OTHER): Payer: 59 | Admitting: Endocrinology

## 2019-10-04 ENCOUNTER — Other Ambulatory Visit: Payer: Self-pay

## 2019-10-04 ENCOUNTER — Encounter: Payer: Self-pay | Admitting: Endocrinology

## 2019-10-04 VITALS — BP 132/82 | HR 69 | Ht 68.0 in | Wt 224.8 lb

## 2019-10-04 DIAGNOSIS — E041 Nontoxic single thyroid nodule: Secondary | ICD-10-CM

## 2019-10-04 NOTE — Progress Notes (Signed)
Patient ID: Eric Dillon, male   DOB: 1955-09-30, 64 y.o.   MRN: [id number]                                                                                                               Reason for Appointment: Follow-up for thyroid    Chief complaint: None   History of Present Illness:   Problem 1: Abnormal thyroid tests  The patient had a routine TSH tested in 08/2017 on his general exam with his PCP This was low at 0.17 and follow-up thyroid panel in 8/19 again showed a low TSH with normal T4 and T3 levels  He has not had any symptoms of palpitations, shakiness, heat intolerance or unusual weight loss  Last TSH was not as suppressed at 0.25, as before her free T4 and free T3 were very normal  No recent labs available  Wt Readings from Last 3 Encounters:  10/04/19 224 lb 12.8 oz (102 kg)  09/23/19 222 lb 12.8 oz (101.1 kg)  09/14/19 224 lb (101.6 kg)     Thyroid function tests as follows:     Lab Results  Component Value Date   FREET4 0.84 09/28/2018   FREET4 0.94 09/11/2018   FREET4 0.89 03/11/2018   T3FREE 3.4 09/11/2018   T3FREE 3.6 03/11/2018   T3FREE 3.3 11/05/2017   TSH 0.25 (L) 09/28/2018   TSH 0.18 (L) 09/11/2018   TSH 0.18 (L) 03/11/2018   No results found for: THYROTRECAB   PROBLEM 2: THYROID NODULE  In 2007 he was having an MRI with angiogram of his neck and a right-sided thyroid nodule was incidentally discovered Ultrasound showed a large 8 cm right sided thyroid nodule  On biopsy this was benign with cytology showing colloid mixed with scant follicular epithelium He has had periodic ultrasound exams subsequently with the last exam in 2015 showing no change in the thyroid nodule which was about 8 cm in size  He does not complain of any local pressure or choking feeling No unusual swallowing difficulties   Allergies as of 10/04/2019      Reactions   Amoxicillin    REACTION: hives   Codeine    REACTION: rash   Sulfonamide Derivatives     REACTION: angioedema   Erythromycin    REACTION: ? reaction   Vitamin B50 Complex [balanced B-100]    Itching, burning in hands      Medication List       Accurate as of October 04, 2019  3:12 PM. If you have any questions, ask your nurse or doctor.        aspirin EC 81 MG tablet Take 81 mg by mouth daily.   atorvastatin 40 MG tablet Commonly known as: LIPITOR TAKE 1 TABLET DAILY   esomeprazole 40 MG capsule Commonly known as: NEXIUM Take 1 capsule (40 mg total) by mouth daily before breakfast. What changed:   how much to take  when to take this  additional instructions   hydrochlorothiazide 12.5 MG capsule Commonly known as:  MICROZIDE Take 1 capsule (12.5 mg total) by mouth daily.   prednisoLONE acetate 1 % ophthalmic suspension Commonly known as: PRED FORTE Place 1 drop into the left eye daily.   ramipril 2.5 MG capsule Commonly known as: ALTACE TAKE 1 CAPSULE DAILY   triamcinolone cream 0.1 % Commonly known as: KENALOG Apply 1 application topically 2 (two) times daily.   Valtrex 1000 MG tablet Generic drug: valACYclovir Take 1,000 mg by mouth daily.   verapamil 240 MG CR tablet Commonly known as: CALAN-SR TAKE 1 TABLET DAILY           Past Medical History:  Diagnosis Date  . Barrett's esophagus - RESOLVED 2008   RESOLVED ON SUBSEQUENT EXAMS  . Carotid artery dissection ([place]) 2011   L  . Diverticulosis   . GERD (gastroesophageal reflux disease)   . Goiter   . Hemorrhoids   . Horner's syndrome 2012  . Hyperlipidemia   . Hypertension   . Personal history of colonic adenomas 01/14/2008  . Prostate cancer ([place]) 2010  . Stroke ([place]) 1996   FROM DISSECTING R CAROID ARTERY     Past Surgical History:  Procedure Laterality Date  . BIOPSY THYROID  2007   colloid  admixed with scant follicular epithelium  . CAROTID ARTERY ANGIOPLASTY  2011   L  . COLONOSCOPY    . COLONOSCOPY W/ BIOPSIES AND POLYPECTOMY  multiple   adenomatous polyps, internal  hemorrhoids, diverticulosis  . CORNEAL TRANSPLANT Left 2015    corneal pannus & perforation,[facility]  . DISSECTION OF CAROID ARTERY     WITH CVA  . KNEE SURGERY     RIGHT..TO STRIGHTEN PATELLA  . ROBOTIC PROSTATECTOMY  2011   Dr Dutch Gray  . UPPER GASTROINTESTINAL ENDOSCOPY  multiple   w/dilation, Barrett's , esophageal ring, hiatal hernia, gastritis    Family History  Problem Relation Age of Onset  . Diabetes Mother   . Stroke Father 43       CVA,BLOOD CLOT TO BRAIN  . CAD Father        BYPASS  . Stroke Paternal Grandmother 81       MULTIPLE CVA'S; onset late70s  . Cancer Neg Hx   . Colon cancer Neg Hx   . Esophageal cancer Neg Hx   . Rectal cancer Neg Hx   . Stomach cancer Neg Hx     Social History:  reports that he quit smoking about 9 years ago. His smoking use included e-cigarettes. He quit after 30.00 years of use. He has never used smokeless tobacco. He reports current alcohol use of about 21.0 - 28.0 standard drinks of alcohol per week. He reports that he does not use drugs.  Allergies:  Allergies  Allergen Reactions  . Amoxicillin     REACTION: hives  . Codeine     REACTION: rash  . Sulfonamide Derivatives     REACTION: angioedema  . Erythromycin     REACTION: ? reaction  . Vitamin B50 Complex [Balanced B-100]     Itching, burning in hands     Review of Systems     Examination:   BP 132/82 (BP Location: Left Arm, Patient Position: Sitting, Cuff Size: Large)   Pulse 69   Ht 5\' 8"  (1.727 m)   Wt 224 lb 12.8 oz (102 kg)   SpO2 96%   BMI 34.18 kg/m   The thyroid is palpable on the right side, best felt on multiple efforts at swallowing  Thyroid nodule is mostly in  the right lateral and upper pole area measuring about 3-4 cm and slightly firm Right lobe otherwise feels lobulated  Left lobe is not palpable  Biceps reflexes were normal    Assessment/Plan:  He has a longstanding history of solitary right thyroid nodule Also has history of  autonomous function   He subjectively is doing well and has no signs or symptoms of hyperthyroidism Thyroid levels not available and will be checked today  Ultrasound had shown 8 cm right thyroid nodule, likely to be a colloid nodule which has been previously benign on biopsy and stable on subsequent ultrasound exams as of 2015  His exam today shows the right side enlargement about the same overall as on previous visits and likely does not need any further ultrasound exams    Elayne Snare 10/04/2019, 3:12 PM    Note: This office note was prepared with Dragon voice recognition system technology. Any transcriptional errors that result from this process are unintentional.

## 2019-10-05 LAB — T4, FREE: Free T4: 0.91 ng/dL (ref 0.60–1.60)

## 2019-10-05 LAB — TSH: TSH: 0.35 u[IU]/mL (ref 0.35–4.50)

## 2019-10-05 LAB — T3, FREE: T3, Free: 3.4 pg/mL (ref 2.3–4.2)

## 2019-10-05 NOTE — Progress Notes (Signed)
Please call to let patient know that the lab results are normal and we can now see him every 2 years

## 2019-10-13 ENCOUNTER — Other Ambulatory Visit: Payer: Self-pay

## 2019-10-13 ENCOUNTER — Ambulatory Visit (HOSPITAL_COMMUNITY): Payer: 59 | Attending: Cardiovascular Disease

## 2019-10-13 DIAGNOSIS — R0602 Shortness of breath: Secondary | ICD-10-CM | POA: Insufficient documentation

## 2019-10-13 LAB — ECHOCARDIOGRAM COMPLETE
Area-P 1/2: 2.71 cm2
S' Lateral: 2.95 cm

## 2019-11-01 ENCOUNTER — Other Ambulatory Visit: Payer: Self-pay | Admitting: Internal Medicine

## 2019-11-03 ENCOUNTER — Other Ambulatory Visit: Payer: Self-pay | Admitting: Internal Medicine

## 2020-02-25 ENCOUNTER — Other Ambulatory Visit: Payer: Self-pay

## 2020-02-27 NOTE — Patient Instructions (Addendum)
Blood work was ordered.     No immunization administered today.   Medications changes include :   none  A carotid ultrasound was ordered.   Please followup in 6 months    Health Maintenance, Male Adopting a healthy lifestyle and getting preventive care are important in promoting health and wellness. Ask your health care provider about:  The right schedule for you to have regular tests and exams.  Things you can do on your own to prevent diseases and keep yourself healthy. What should I know about diet, weight, and exercise? Eat a healthy diet   Eat a diet that includes plenty of vegetables, fruits, low-fat dairy products, and lean protein.  Do not eat a lot of foods that are high in solid fats, added sugars, or sodium. Maintain a healthy weight Body mass index (BMI) is a measurement that can be used to identify possible weight problems. It estimates body fat based on height and weight. Your health care provider can help determine your BMI and help you achieve or maintain a healthy weight. Get regular exercise Get regular exercise. This is one of the most important things you can do for your health. Most adults should:  Exercise for at least 150 minutes each week. The exercise should increase your heart rate and make you sweat (moderate-intensity exercise).  Do strengthening exercises at least twice a week. This is in addition to the moderate-intensity exercise.  Spend less time sitting. Even light physical activity can be beneficial. Watch cholesterol and blood lipids Have your blood tested for lipids and cholesterol at 63 years of age, then have this test every 5 years. You may need to have your cholesterol levels checked more often if:  Your lipid or cholesterol levels are high.  You are older than 63 years of age.  You are at high risk for heart disease. What should I know about cancer screening? Many types of cancers can be detected early and may often be  prevented. Depending on your health history and family history, you may need to have cancer screening at various ages. This may include screening for:  Colorectal cancer.  Prostate cancer.  Skin cancer.  Lung cancer. What should I know about heart disease, diabetes, and high blood pressure? Blood pressure and heart disease  High blood pressure causes heart disease and increases the risk of stroke. This is more likely to develop in people who have high blood pressure readings, are of African descent, or are overweight.  Talk with your health care provider about your target blood pressure readings.  Have your blood pressure checked: ? Every 3-5 years if you are 67-30 years of age. ? Every year if you are 73 years old or older.  If you are between the ages of 44 and 23 and are a current or former smoker, ask your health care provider if you should have a one-time screening for abdominal aortic aneurysm (AAA). Diabetes Have regular diabetes screenings. This checks your fasting blood sugar level. Have the screening done:  Once every three years after age 3 if you are at a normal weight and have a low risk for diabetes.  More often and at a younger age if you are overweight or have a high risk for diabetes. What should I know about preventing infection? Hepatitis B If you have a higher risk for hepatitis B, you should be screened for this virus. Talk with your health care provider to find out if you are at risk for  hepatitis B infection. Hepatitis C Blood testing is recommended for:  Everyone born from [birth year] through 1964.  Anyone with known risk factors for hepatitis C. Sexually transmitted infections (STIs)  You should be screened each year for STIs, including gonorrhea and chlamydia, if: ? You are sexually active and are younger than 63 years of age. ? You are older than 63 years of age and your health care provider tells you that you are at risk for this type of  infection. ? Your sexual activity has changed since you were last screened, and you are at increased risk for chlamydia or gonorrhea. Ask your health care provider if you are at risk.  Ask your health care provider about whether you are at high risk for HIV. Your health care provider may recommend a prescription medicine to help prevent HIV infection. If you choose to take medicine to prevent HIV, you should first get tested for HIV. You should then be tested every 3 months for as long as you are taking the medicine. Follow these instructions at home: Lifestyle  Do not use any products that contain nicotine or tobacco, such as cigarettes, e-cigarettes, and chewing tobacco. If you need help quitting, ask your health care provider.  Do not use street drugs.  Do not share needles.  Ask your health care provider for help if you need support or information about quitting drugs. Alcohol use  Do not drink alcohol if your health care provider tells you not to drink.  If you drink alcohol: ? Limit how much you have to 0-2 drinks a day. ? Be aware of how much alcohol is in your drink. In the [place] one drink equals one 12 oz bottle of beer (355 mL), one 5 oz glass of wine (148 mL), or one 1 oz glass of hard liquor (44 mL). General instructions  Schedule regular health, dental, and eye exams.  Stay current with your vaccines.  Tell your health care provider if: ? You often feel depressed. ? You have ever been abused or do not feel safe at home. Summary  Adopting a healthy lifestyle and getting preventive care are important in promoting health and wellness.  Follow your health care provider's instructions about healthy diet, exercising, and getting tested or screened for diseases.  Follow your health care provider's instructions on monitoring your cholesterol and blood pressure. This information is not intended to replace advice given to you by your health care provider. Make sure you  discuss any questions you have with your health care provider. Document Revised: 03/04/2018 Document Reviewed: 03/04/2018 Elsevier Patient Education  2020 [employer].

## 2020-02-27 NOTE — Progress Notes (Signed)
Subjective:    Patient ID: Eric Dillon, male    DOB: 06-Nov-1955, 64 y.o.   MRN: [id number]  HPI He is here for a physical exam.   Right ear - wet at night, peeling and itchy.  No discomfort or change in hearing.    No other concerns.   Medications and allergies reviewed with patient and updated if appropriate.  Patient Active Problem List   Diagnosis Date Noted  . Fibromuscular dysplasia ([place]) 09/14/2019  . Umbilical hernia without obstruction or gangrene 09/14/2019  . DOE (dyspnea on exertion) 09/14/2019  . Subclinical hyperthyroidism 03/10/2018  . Skin cancer 09/09/2017  . Herpes zoster without complication 71/69/6789  . Diabetes ([place]) 06/08/2015  . H/O cornea transplant, left 05/30/2015  . Psoriasis 12/08/2014  . Nonspecific abnormal electrocardiogram (ECG) (EKG) 03/23/2014  . 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 Dissection of carotid artery ([place]) 01/19/2010  . 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 PROSTATE CANCER, HX OF 12/28/2008  . Personal history of colonic adenomas 01/14/2008  . HYPERLIPIDEMIA 03/03/2007  . Essential hypertension 03/03/2007  . GERD with stricture 03/03/2007  . History of CVA (cerebrovascular accident) 03/03/2007    Current Outpatient Medications on File Prior to Visit  Medication Sig Dispense Refill  . aspirin EC 81 MG tablet Take 81 mg by mouth daily.    Marland Kitchen atorvastatin (LIPITOR) 40 MG tablet TAKE 1 TABLET DAILY 90 tablet 3  . esomeprazole (NEXIUM) 40 MG capsule Take 1 capsule (40 mg total) by mouth daily before breakfast. (Patient taking differently: Take 20 mg by mouth daily. Take 2 tablets daily) 90 capsule 3  . hydrochlorothiazide (MICROZIDE) 12.5 MG capsule TAKE 1 CAPSULE BY MOUTH EVERY DAY 90 capsule 0  . prednisoLONE acetate (PRED FORTE) 1 % ophthalmic suspension Place 1 drop into the left eye daily.    . ramipril (ALTACE) 2.5 MG capsule TAKE 1 CAPSULE DAILY 90 capsule 3  . triamcinolone cream (KENALOG) 0.1 % Apply 1  application topically 2 (two) times daily. 30 g 0  . valACYclovir (VALTREX) 1000 MG tablet Take 1,000 mg by mouth daily.    . verapamil (CALAN-SR) 240 MG CR tablet TAKE 1 TABLET DAILY 90 tablet 3   No current facility-administered medications on file prior to visit.    Past Medical History:  Diagnosis Date  . Barrett's esophagus - RESOLVED 2008   RESOLVED ON SUBSEQUENT EXAMS  . Carotid artery dissection ([place]) 2011   L  . Diverticulosis   . GERD (gastroesophageal reflux disease)   . Goiter   . Hemorrhoids   . Horner's syndrome 2012  . Hyperlipidemia   . Hypertension   . Personal history of colonic adenomas 01/14/2008  . Prostate cancer ([place]) 2010  . Stroke ([place]) 1996   FROM DISSECTING R CAROID ARTERY     Past Surgical History:  Procedure Laterality Date  . BIOPSY THYROID  2007   colloid  admixed with scant follicular epithelium  . CAROTID ARTERY ANGIOPLASTY  2011   L  . COLONOSCOPY    . COLONOSCOPY W/ BIOPSIES AND POLYPECTOMY  multiple   adenomatous polyps, internal hemorrhoids, diverticulosis  . CORNEAL TRANSPLANT Left 2015    corneal pannus & perforation,[facility]  . DISSECTION OF CAROID ARTERY     WITH CVA  . KNEE SURGERY     RIGHT..TO STRIGHTEN PATELLA  . ROBOTIC PROSTATECTOMY  2011   Dr Dutch Gray  . UPPER GASTROINTESTINAL ENDOSCOPY  multiple   w/dilation, Barrett's , esophageal ring, hiatal hernia,  gastritis    Social History   Socioeconomic History  . Marital status: Married    Spouse name: Not on file  . Number of children: 2  . Years of education: Not on file  . Highest education level: Not on file  Occupational History  . Occupation: Analyst  Tobacco Use  . Smoking status: Former Smoker    Years: 30.00    Types: E-cigarettes    Quit date: 01/23/2010    Years since quitting: 10.1  . Smokeless tobacco: Never Used  . Tobacco comment: smoked 1974-2011, up to 1 ppd  Substance and Sexual Activity  . Alcohol use: Yes    Alcohol/week: 21.0 - 28.0  standard drinks    Types: 21 - 28 Cans of beer per week  . Drug use: No  . Sexual activity: Not on file  Other Topics Concern  . Not on file  Social History Narrative   Gets reg exercise   Social Determinants of Health   Financial Resource Strain:   . Difficulty of Paying Living Expenses: Not on file  Food Insecurity:   . Worried About Charity fundraiser in the Last Year: Not on file  . Ran Out of Food in the Last Year: Not on file  Transportation Needs:   . Lack of Transportation (Medical): Not on file  . Lack of Transportation (Non-Medical): Not on file  Physical Activity:   . Days of Exercise per Week: Not on file  . Minutes of Exercise per Session: Not on file  Stress:   . Feeling of Stress : Not on file  Social Connections:   . Frequency of Communication with Friends and Family: Not on file  . Frequency of Social Gatherings with Friends and Family: Not on file  . Attends Religious Services: Not on file  . Active Member of Clubs or Organizations: Not on file  . Attends Archivist Meetings: Not on file  . Marital Status: Not on file    Family History  Problem Relation Age of Onset  . Diabetes Mother   . Stroke Father 34       CVA,BLOOD CLOT TO BRAIN  . CAD Father        BYPASS  . Stroke Paternal Grandmother 81       MULTIPLE CVA'S; onset late70s  . Cancer Neg Hx   . Colon cancer Neg Hx   . Esophageal cancer Neg Hx   . Rectal cancer Neg Hx   . Stomach cancer Neg Hx     Review of Systems  Constitutional: Negative for chills and fever.  Eyes: Negative for visual disturbance.  Respiratory: Negative for cough, shortness of breath and wheezing.   Cardiovascular: Negative for chest pain, palpitations and leg swelling.  Gastrointestinal: Negative for abdominal pain, blood in stool, constipation, diarrhea and nausea.       Gerd controlled  Genitourinary: Negative for difficulty urinating, dysuria and hematuria.  Musculoskeletal: Positive for arthralgias  (knees).  Skin: Negative for color change and rash.  Neurological: Negative for dizziness, light-headedness and headaches.  Psychiatric/Behavioral: Negative for dysphoric mood. The patient is not nervous/anxious.        Objective:   Vitals:   02/28/20 1321  BP: 130/72  Pulse: 75  Temp: 98.4 F (36.9 C)  SpO2: 97%   Filed Weights   02/28/20 1321  Weight: 226 lb (102.5 kg)   Body mass index is 34.36 kg/m.  BP Readings from Last 3 Encounters:  02/28/20 130/72  10/04/19  132/82  09/23/19 140/86    Wt Readings from Last 3 Encounters:  02/28/20 226 lb (102.5 kg)  10/04/19 224 lb 12.8 oz (102 kg)  09/23/19 222 lb 12.8 oz (101.1 kg)     Physical Exam Constitutional: He appears well-developed and well-nourished. No distress.  HENT:  Head: Normocephalic and atraumatic.  Right Ear: External ear normal.  Right ear canal with mild skin dryness, TM normal.  Left Ear: External ear normal. Ear canal and TM normal.  Mouth/Throat: Oropharynx is clear and moist.  Eyes: Conjunctivae and EOM are normal.  Neck: Neck supple. No tracheal deviation present. No thyromegaly present. No carotid bruit  Cardiovascular: Normal rate, regular rhythm, normal heart sounds and intact distal pulses.   No murmur heard. Pulmonary/Chest: Effort normal and breath sounds normal. No respiratory distress. He has no wheezes. He has no rales.  Abdominal: Soft. Obese.  Umbilical hernia - nontender, reducible. He exhibits no distension. There is no tenderness.  Genitourinary: deferred  Musculoskeletal: He exhibits no edema.  Lymphadenopathy:   He has no cervical adenopathy.  Skin: Skin is warm and dry. He is not diaphoretic.  Psychiatric: He has a normal mood and affect. His behavior is normal.         Assessment & Plan:   Physical exam: Screening blood work  ordered Immunizations   Up to date. Advised covid booster. Colonoscopy   Up to date  Eye exams   Up to date  Exercise   Walking - some - could  do more - encouraged more regular exercise Weight   Advised weight loss Substance abuse   none Sees derm annually  See Problem List for Assessment and Plan of chronic medical problems.   This visit occurred during the SARS-CoV-2 public health emergency.  Safety protocols were in place, including screening questions prior to the visit, additional usage of staff PPE, and extensive cleaning of exam room while observing appropriate contact time as indicated for disinfecting solutions.

## 2020-02-28 ENCOUNTER — Ambulatory Visit (INDEPENDENT_AMBULATORY_CARE_PROVIDER_SITE_OTHER): Payer: 59 | Admitting: Internal Medicine

## 2020-02-28 ENCOUNTER — Encounter: Payer: Self-pay | Admitting: Internal Medicine

## 2020-02-28 ENCOUNTER — Other Ambulatory Visit: Payer: Self-pay

## 2020-02-28 VITALS — BP 130/72 | HR 75 | Temp 98.4°F | Ht 68.0 in | Wt 226.0 lb

## 2020-02-28 DIAGNOSIS — E1169 Type 2 diabetes mellitus with other specified complication: Secondary | ICD-10-CM

## 2020-02-28 DIAGNOSIS — I10 Essential (primary) hypertension: Secondary | ICD-10-CM | POA: Diagnosis not present

## 2020-02-28 DIAGNOSIS — Z0001 Encounter for general adult medical examination with abnormal findings: Secondary | ICD-10-CM | POA: Diagnosis not present

## 2020-02-28 DIAGNOSIS — E782 Mixed hyperlipidemia: Secondary | ICD-10-CM

## 2020-02-28 DIAGNOSIS — I773 Arterial fibromuscular dysplasia: Secondary | ICD-10-CM

## 2020-02-28 DIAGNOSIS — Z0000 Encounter for general adult medical examination without abnormal findings: Secondary | ICD-10-CM

## 2020-02-28 DIAGNOSIS — K219 Gastro-esophageal reflux disease without esophagitis: Secondary | ICD-10-CM | POA: Diagnosis not present

## 2020-02-28 DIAGNOSIS — K429 Umbilical hernia without obstruction or gangrene: Secondary | ICD-10-CM

## 2020-02-28 DIAGNOSIS — K222 Esophageal obstruction: Secondary | ICD-10-CM

## 2020-02-28 DIAGNOSIS — Z8546 Personal history of malignant neoplasm of prostate: Secondary | ICD-10-CM

## 2020-02-28 DIAGNOSIS — Z8673 Personal history of transient ischemic attack (TIA), and cerebral infarction without residual deficits: Secondary | ICD-10-CM

## 2020-02-28 DIAGNOSIS — I7771 Dissection of carotid artery: Secondary | ICD-10-CM

## 2020-02-28 LAB — CBC WITH DIFFERENTIAL/PLATELET
Basophils Absolute: 0.1 10*3/uL (ref 0.0–0.1)
Basophils Relative: 0.8 % (ref 0.0–3.0)
Eosinophils Absolute: 0.1 10*3/uL (ref 0.0–0.7)
Eosinophils Relative: 1.2 % (ref 0.0–5.0)
HCT: 43.2 % (ref 39.0–52.0)
Hemoglobin: 14.8 g/dL (ref 13.0–17.0)
Lymphocytes Relative: 15.8 % (ref 12.0–46.0)
Lymphs Abs: 1.3 10*3/uL (ref 0.7–4.0)
MCHC: 34.3 g/dL (ref 30.0–36.0)
MCV: 103.7 fl — ABNORMAL HIGH (ref 78.0–100.0)
Monocytes Absolute: 0.6 10*3/uL (ref 0.1–1.0)
Monocytes Relative: 7.9 % (ref 3.0–12.0)
Neutro Abs: 5.9 10*3/uL (ref 1.4–7.7)
Neutrophils Relative %: 74.3 % (ref 43.0–77.0)
Platelets: 259 10*3/uL (ref 150.0–400.0)
RBC: 4.16 Mil/uL — ABNORMAL LOW (ref 4.22–5.81)
RDW: 12.7 % (ref 11.5–15.5)
WBC: 7.9 10*3/uL (ref 4.0–10.5)

## 2020-02-28 LAB — COMPREHENSIVE METABOLIC PANEL
ALT: 61 U/L — ABNORMAL HIGH (ref 0–53)
AST: 35 U/L (ref 0–37)
Albumin: 4 g/dL (ref 3.5–5.2)
Alkaline Phosphatase: 77 U/L (ref 39–117)
BUN: 16 mg/dL (ref 6–23)
CO2: 27 mEq/L (ref 19–32)
Calcium: 9.5 mg/dL (ref 8.4–10.5)
Chloride: 100 mEq/L (ref 96–112)
Creatinine, Ser: 0.9 mg/dL (ref 0.40–1.50)
GFR: 91.06 mL/min (ref >60.00)
Glucose, Bld: 170 mg/dL — ABNORMAL HIGH (ref 70–99)
Potassium: 4.1 mEq/L (ref 3.5–5.1)
Sodium: 136 mEq/L (ref 135–145)
Total Bilirubin: 0.4 mg/dL (ref 0.2–1.2)
Total Protein: 6.8 g/dL (ref 6.0–8.3)

## 2020-02-28 LAB — LIPID PANEL
Cholesterol: 175 mg/dL (ref 0–200)
HDL: 63.4 mg/dL (ref >39.00)
NonHDL: 111.97
Total CHOL/HDL Ratio: 3
Triglycerides: 252 mg/dL — ABNORMAL HIGH (ref 0.0–149.0)
VLDL: 50.4 mg/dL — ABNORMAL HIGH (ref 0.0–40.0)

## 2020-02-28 LAB — TSH: TSH: 0.38 u[IU]/mL (ref 0.35–4.50)

## 2020-02-28 LAB — HEMOGLOBIN A1C: Hgb A1c MFr Bld: 6.5 % (ref 4.6–6.5)

## 2020-02-28 LAB — LDL CHOLESTEROL, DIRECT: Direct LDL: 84 mg/dL

## 2020-02-28 NOTE — Assessment & Plan Note (Signed)
Chronic H/o R carotid dissection Check carotid US

## 2020-02-28 NOTE — Assessment & Plan Note (Signed)
Chronic Stable No pain - continue to monitor

## 2020-02-28 NOTE — Assessment & Plan Note (Signed)
Chronic H/o CVA w/o residual Related to dissection of R carotid - FMD Continue atorvastatin 40 mg daily, ASA 81 mg daily BP controlled

## 2020-02-28 NOTE — Assessment & Plan Note (Signed)
Chronic - h/o Due to FMD Continue statin Ck carotid US

## 2020-02-28 NOTE — Assessment & Plan Note (Signed)
Chronic GERD controlled Continue nexium 40 mg daily  

## 2020-02-28 NOTE — Assessment & Plan Note (Signed)
Chronic Check a1c Low sugar / carb diet Stressed regular exercise encouraged weight loss

## 2020-02-28 NOTE — Assessment & Plan Note (Signed)
Chronic BP well controlled Continue hctz 12.5 mg daily, ramipiril 2.5 mg daily, verapamil 240 mg daily cmp

## 2020-02-28 NOTE — Assessment & Plan Note (Signed)
Chronic Check lipid panel  Continue atorvastatin 40 mg daily Regular exercise and healthy diet encouraged  

## 2020-02-28 NOTE — Assessment & Plan Note (Signed)
Chronic No longer seeing urology psa

## 2020-02-29 LAB — PSA, TOTAL AND FREE
PSA, % Free: UNDETERMINED % (calc) (ref >25)
PSA, Free: <0.1 ng/mL
PSA, Total: <0.1 ng/mL (ref <=4.0)

## 2020-03-02 ENCOUNTER — Encounter: Payer: Self-pay | Admitting: Internal Medicine

## 2020-03-02 DIAGNOSIS — E782 Mixed hyperlipidemia: Secondary | ICD-10-CM

## 2020-03-02 MED ORDER — ROSUVASTATIN CALCIUM 40 MG PO TABS: 40.0000 mg | ORAL_TABLET | Freq: Every day | ORAL | 3 refills | Status: DC

## 2020-03-03 ENCOUNTER — Other Ambulatory Visit: Payer: Self-pay | Admitting: Internal Medicine

## 2020-03-07 ENCOUNTER — Ambulatory Visit (HOSPITAL_COMMUNITY)
Admission: RE | Admit: 2020-03-07 | Discharge: 2020-03-07 | Disposition: A | Discharge status: Home / Self Care | Payer: 59 | Source: Ambulatory Visit | Attending: Cardiology | Admitting: Cardiology

## 2020-03-07 ENCOUNTER — Other Ambulatory Visit: Payer: Self-pay

## 2020-03-07 DIAGNOSIS — I7771 Dissection of carotid artery: Secondary | ICD-10-CM

## 2020-03-07 DIAGNOSIS — I773 Arterial fibromuscular dysplasia: Secondary | ICD-10-CM | POA: Insufficient documentation

## 2020-03-26 NOTE — Progress Notes (Unsigned)
Cardiology Office Note:   Date:  03/27/2020  NAME:  Eric Dillon    MRN: [id number] DOB:  [birth year]   PCP:  Binnie Rail, MD  Cardiologist:  No primary care provider on file.  Electrophysiologist:  None   Referring MD: Binnie Rail, MD   Chief Complaint  Patient presents with  . Shortness of Breath   History of Present Illness:   Eric Dillon is a 65 y.o. male with a hx of carotid artery dissection/CVA, DM, HTN, obesity, HLD who presents for follow-up. Seen this past year for SOB attributed to deconditioning.  He had a BNP that was normal.  He also had an echocardiogram that was normal.  No symptoms of angina reported. He reports his symptoms of shortness of breath did not improve.  Whenever he does any heavy exertion he gets out of breath.  He describes some chest tightness with it as well.  The chest tightness resolved after he stopped doing any heavy activity.  Symptoms occur daily if he does heavy activity. If he is walking on flat surfaces or had a leisurely pace he has no symptoms.  His weights have remained largely unchanged.  He has diabetic with an A1c of 6.5.  Most recent lipid profile shows his LDL cholesterol is not at goal.  He was switched to Crestor 40 mg a day by his primary care physician.  I do agree with this.  I have plans to recheck this in 1 month.  Given that his symptoms have not improved I have recommended nuclear medicine stress test.  Have also recommended coronary calcium scoring.  Significant CVD risk factors include hypertension, hyperlipidemia and former tobacco abuse.  He also has a history of fibromuscular dysplasia with right internal carotid artery dissection.  Problem List 1. R ICA dissection (FMD)/CVA -1997 -2011 2. HLD -T chol 175, HDL 63, LDL 84, TG 252 3. DM -A1c 6.5 4. Former Tobacco abuse  5. Carotid Artery disease -R ICA 1-39%  Past Medical History: Past Medical History:  Diagnosis Date  . Barrett's esophagus - RESOLVED 2008    RESOLVED ON SUBSEQUENT EXAMS  . Carotid artery dissection ([place]) 2011   L  . Diverticulosis   . GERD (gastroesophageal reflux disease)   . Goiter   . Hemorrhoids   . Horner's syndrome 2012  . Hyperlipidemia   . Hypertension   . Personal history of colonic adenomas 01/14/2008  . Prostate cancer ([place]) 2010  . Stroke ([place]) 1996   FROM DISSECTING R CAROID ARTERY     Past Surgical History: Past Surgical History:  Procedure Laterality Date  . BIOPSY THYROID  2007   colloid  admixed with scant follicular epithelium  . CAROTID ARTERY ANGIOPLASTY  2011   L  . COLONOSCOPY    . COLONOSCOPY W/ BIOPSIES AND POLYPECTOMY  multiple   adenomatous polyps, internal hemorrhoids, diverticulosis  . CORNEAL TRANSPLANT Left 2015    corneal pannus & perforation,[facility]  . DISSECTION OF CAROID ARTERY     WITH CVA  . KNEE SURGERY     RIGHT..TO STRIGHTEN PATELLA  . ROBOTIC PROSTATECTOMY  2011   Dr Dutch Gray  . UPPER GASTROINTESTINAL ENDOSCOPY  multiple   w/dilation, Barrett's , esophageal ring, hiatal hernia, gastritis    Current Medications: Current Meds  Medication Sig  . aspirin EC 81 MG tablet Take 81 mg by mouth daily.  Marland Kitchen esomeprazole (NEXIUM) 40 MG capsule Take 1 capsule (40 mg total) by mouth daily before breakfast. (  Patient taking differently: Take 20 mg by mouth daily. Take 2 tablets daily)  . hydrochlorothiazide (MICROZIDE) 12.5 MG capsule TAKE 1 CAPSULE BY MOUTH EVERY DAY  . prednisoLONE acetate (PRED FORTE) 1 % ophthalmic suspension Place 1 drop into the left eye daily.  . ramipril (ALTACE) 2.5 MG capsule TAKE 1 CAPSULE DAILY  . rosuvastatin (CRESTOR) 40 MG tablet Take 1 tablet (40 mg total) by mouth daily.  Marland Kitchen triamcinolone cream (KENALOG) 0.1 % Apply 1 application topically 2 (two) times daily.  . valACYclovir (VALTREX) 1000 MG tablet Take 1,000 mg by mouth daily.  . verapamil (CALAN-SR) 240 MG CR tablet TAKE 1 TABLET DAILY     Allergies:    Amoxicillin, Codeine, Sulfonamide  derivatives, Erythromycin, and Vitamin b50 complex [balanced b-100]   Social History: Social History   Socioeconomic History  . Marital status: Married    Spouse name: Not on file  . Number of children: 2  . Years of education: Not on file  . Highest education level: Not on file  Occupational History  . Occupation: Analyst  Tobacco Use  . Smoking status: Former Smoker    Years: 30.00    Types: E-cigarettes    Quit date: 01/23/2010    Years since quitting: 10.1  . Smokeless tobacco: Never Used  . Tobacco comment: smoked 1974-2011, up to 1 ppd  Substance and Sexual Activity  . Alcohol use: Yes    Alcohol/week: 21.0 - 28.0 standard drinks    Types: 21 - 28 Cans of beer per week  . Drug use: No  . Sexual activity: Not on file  Other Topics Concern  . Not on file  Social History Narrative   Gets reg exercise   Social Determinants of Health   Financial Resource Strain: Not on file  Food Insecurity: Not on file  Transportation Needs: Not on file  Physical Activity: Not on file  Stress: Not on file  Social Connections: Not on file    Family History: The patient's family history includes CAD in his father; Diabetes in his mother; Stroke (age of onset: 79) in his father; Stroke (age of onset: 62) in his paternal grandmother. There is no history of Cancer, Colon cancer, Esophageal cancer, Rectal cancer, or Stomach cancer.  ROS:   All other ROS reviewed and negative. Pertinent positives noted in the HPI.     EKGs/Labs/Other Studies Reviewed:   The following studies were personally reviewed by me today:  TTE 10/13/2019 1. Left ventricular ejection fraction, by estimation, is 60 to 65%. The  left ventricle has normal function. The left ventricle has no regional  wall motion abnormalities. Left ventricular diastolic parameters were  normal.  2. Right ventricular systolic function is normal. The right ventricular  size is normal. There is normal pulmonary artery systolic  pressure. The  estimated right ventricular systolic pressure is 0000000 mmHg.  3. The mitral valve is grossly normal. No evidence of mitral valve  regurgitation. No evidence of mitral stenosis.  4. The aortic valve is tricuspid. Aortic valve regurgitation is not  visualized. No aortic stenosis is present.  5. The inferior vena cava is normal in size with greater than 50%  respiratory variability, suggesting right atrial pressure of 3 mmHg.   Cartoid [place] 03/07/2020 Summary:  Right Carotid: Velocities in the right ICA are consistent with a 1-39%  stenosis.   Left Carotid: There is no evidence of stenosis in the left ICA. The  extracranial        vessels  were near-normal with only minimal wall thickening  or        plaque.   Vertebrals: Bilateral vertebral arteries demonstrate antegrade flow.  Subclavians: Normal flow hemodynamics were seen in bilateral subclavian        arteries.   Recent Labs: 09/23/2019: BNP 26.9 02/28/2020: ALT 61; BUN 16; Creatinine, Ser 0.90; Hemoglobin 14.8; Platelets 259.0; Potassium 4.1; Sodium 136; TSH 0.38   Recent Lipid Panel    Component Value Date/Time   CHOL 175 02/28/2020 1348   CHOL 191 06/23/2013 0926   TRIG 252.0 (H) 02/28/2020 1348   TRIG 123 06/23/2013 0926   HDL 63.40 02/28/2020 1348   HDL 71 06/23/2013 0926   CHOLHDL 3 02/28/2020 1348   VLDL 50.4 (H) 02/28/2020 1348   LDLCALC 80 03/15/2019 1411   LDLCALC 95 06/23/2013 0926   LDLDIRECT 84.0 02/28/2020 1348    Physical Exam:   VS:  BP 134/74   Pulse 65   Ht 5\' 8"  (1.727 m)   Wt 220 lb (99.8 kg)   SpO2 97%   BMI 33.45 kg/m    Wt Readings from Last 3 Encounters:  03/27/20 220 lb (99.8 kg)  02/28/20 226 lb (102.5 kg)  10/04/19 224 lb 12.8 oz (102 kg)    General: Well nourished, well developed, in no acute distress Head: Atraumatic, normal size  Eyes: PEERLA, EOMI  Neck: Supple, no JVD Endocrine: No thryomegaly Cardiac: Normal S1, S2; RRR; no murmurs,  rubs, or gallops Lungs: Clear to auscultation bilaterally, no wheezing, rhonchi or rales  Abd: Soft, nontender, no hepatomegaly  Ext: No edema, pulses 2+ Musculoskeletal: No deformities, BUE and BLE strength normal and equal Skin: Warm and dry, no rashes   Neuro: Alert and oriented to person, place, time, and situation, CNII-XII grossly intact, no focal deficits  Psych: Normal mood and affect   ASSESSMENT:   CARDEL WERNETTE is a 65 y.o. male who presents for the following: 1. SOB (shortness of breath) on exertion   2. Mixed hyperlipidemia   3. Essential hypertension     PLAN:   1. SOB (shortness of breath) on exertion -Symptoms of shortness of breath with heavy exertion.  He also has exertional chest tightness.  CVD risk factors include diabetes, hyperlipidemia, obesity, former tobacco abuse.  He did complete an echocardiogram this summer and it was normal.  BNP was normal as well. -EKG was unremarkable in July.  I recommended to proceed with a nuclear medicine stress test.  He can walk on a treadmill.  I would also like for him to obtain a coronary calcium score.  This will further read stratify his stress testing.  This will also help better stratify his cholesterol lowering agents.  2. Mixed hyperlipidemia -Diabetic with an A1c of 6.5.  LDL 84.  Triglycerides 252.  Recently changed to Crestor 40 mg a day.  Plan to recheck in 1 month per primary care physician.  Goal LDL cholesterol is less than 70.  Goal triglycerides are less than 150.  If he is not able to get there we may need to intensify therapy.  He may end up needing Zetia and Vascepa.  3. Essential hypertension -BP well controlled.  No change to medications.  Disposition: Return in about 6 months (around 09/24/2020).  Medication Adjustments/Labs and Tests Ordered: Current medicines are reviewed at length with the patient today.  Concerns regarding medicines are outlined above.  Orders Placed This Encounter  Procedures  . CT  CARDIAC SCORING (SELF PAY ONLY)  .  MYOCARDIAL PERFUSION IMAGING   No orders of the defined types were placed in this encounter.   Patient Instructions  Medication Instructions:  The current medical regimen is effective;  continue present plan and medications.  *If you need a refill on your cardiac medications before your next appointment, please call your pharmacy*    Testing/Procedures: CALCIUM SCORE  Your physician has requested that you have an Exercise Myoview. A cardiac stress test is a cardiological test that measures the heart's ability to respond to external stress in a controlled clinical environment. The stress response is induced by exercise (exercise-treadmill). For further information please visit [email or web address] If you have questions or concerns about your appointment, you can call the Nuclear Lab at [phone].    Follow-Up: At [facility], you and your health needs are our priority.  As part of our continuing mission to provide you with exceptional heart care, we have created designated Provider Care Teams.  These Care Teams include your primary Cardiologist (physician) and Advanced Practice Providers (APPs -  Physician Assistants and Nurse Practitioners) who all work together to provide you with the care you need, when you need it.  We recommend signing up for the patient portal called "MyChart".  Sign up information is provided on this After Visit Summary.  MyChart is used to connect with patients for Virtual Visits (Telemedicine).  Patients are able to view lab/test results, encounter notes, upcoming appointments, etc.  Non-urgent messages can be sent to your provider as well.   To learn more about what you can do with MyChart, go to [email or web address].    Your next appointment:   6 month(s)  The format for your next appointment:   In Person  Provider:   Lennie Odor, MD        Time Spent with Patient: I have spent a total of 35 minutes with  patient reviewing hospital notes, telemetry, EKGs, labs and examining the patient as well as establishing an assessment and plan that was discussed with the patient.  > 50% of time was spent in direct patient care.  Signed, Lenna Gilford. Flora Lipps, MD [facility]  [address], [address] [place], [place] [address] [phone]  03/27/2020 2:26 PM

## 2020-03-27 ENCOUNTER — Ambulatory Visit (INDEPENDENT_AMBULATORY_CARE_PROVIDER_SITE_OTHER): Payer: 59 | Admitting: Cardiovascular Disease

## 2020-03-27 ENCOUNTER — Other Ambulatory Visit: Payer: Self-pay

## 2020-03-27 ENCOUNTER — Encounter: Payer: Self-pay | Admitting: Cardiovascular Disease

## 2020-03-27 VITALS — BP 134/74 | HR 65 | Ht 68.0 in | Wt 220.0 lb

## 2020-03-27 DIAGNOSIS — E782 Mixed hyperlipidemia: Secondary | ICD-10-CM

## 2020-03-27 DIAGNOSIS — R0602 Shortness of breath: Secondary | ICD-10-CM | POA: Diagnosis not present

## 2020-03-27 DIAGNOSIS — I10 Essential (primary) hypertension: Secondary | ICD-10-CM

## 2020-03-27 DIAGNOSIS — Z720 Tobacco use: Secondary | ICD-10-CM

## 2020-03-27 NOTE — Patient Instructions (Signed)
Medication Instructions:  The current medical regimen is effective;  continue present plan and medications.  *If you need a refill on your cardiac medications before your next appointment, please call your pharmacy*    Testing/Procedures: CALCIUM SCORE  Your physician has requested that you have an Exercise Myoview. A cardiac stress test is a cardiological test that measures the heart's ability to respond to external stress in a controlled clinical environment. The stress response is induced by exercise (exercise-treadmill). For further information please visit [email or web address] If you have questions or concerns about your appointment, you can call the Nuclear Lab at [phone].    Follow-Up: At [facility], you and your health needs are our priority.  As part of our continuing mission to provide you with exceptional heart care, we have created designated Provider Care Teams.  These Care Teams include your primary Cardiologist (physician) and Advanced Practice Providers (APPs -  Physician Assistants and Nurse Practitioners) who all work together to provide you with the care you need, when you need it.  We recommend signing up for the patient portal called "MyChart".  Sign up information is provided on this After Visit Summary.  MyChart is used to connect with patients for Virtual Visits (Telemedicine).  Patients are able to view lab/test results, encounter notes, upcoming appointments, etc.  Non-urgent messages can be sent to your provider as well.   To learn more about what you can do with MyChart, go to [email or web address].    Your next appointment:   6 month(s)  The format for your next appointment:   In Person  Provider:   Lennie Odor, MD

## 2020-04-05 ENCOUNTER — Encounter: Payer: Self-pay | Admitting: Cardiovascular Disease

## 2020-04-05 ENCOUNTER — Other Ambulatory Visit: Payer: Self-pay

## 2020-04-05 ENCOUNTER — Ambulatory Visit (INDEPENDENT_AMBULATORY_CARE_PROVIDER_SITE_OTHER)
Admission: RE | Admit: 2020-04-05 | Discharge: 2020-04-05 | Disposition: A | Discharge status: Home / Self Care | Payer: Self-pay | Source: Ambulatory Visit | Attending: Cardiovascular Disease | Admitting: Cardiovascular Disease

## 2020-04-05 DIAGNOSIS — R0602 Shortness of breath: Secondary | ICD-10-CM

## 2020-04-07 ENCOUNTER — Other Ambulatory Visit: Payer: Self-pay

## 2020-04-07 DIAGNOSIS — R079 Chest pain, unspecified: Secondary | ICD-10-CM

## 2020-04-07 NOTE — Progress Notes (Signed)
Shared Decision Making/Informed Consent The risks [chest pain, shortness of breath, cardiac arrhythmias, dizziness, blood pressure fluctuations, myocardial infarction, stroke/transient ischemic attack, nausea, vomiting, allergic reaction, radiation exposure, metallic taste sensation and life-threatening complications (estimated to be 1 in 10,000)], benefits (risk stratification, diagnosing coronary artery disease, treatment guidance) and alternatives of a nuclear stress test were discussed in detail with Eric Dillon and he agrees to proceed.  [name] T. Audie Box, [place]  [address] [place],  [address] [phone]  4:52 PM

## 2020-04-10 ENCOUNTER — Inpatient Hospital Stay (HOSPITAL_COMMUNITY): Admission: RE | Admit: 2020-04-10 | Payer: 59 | Source: Ambulatory Visit

## 2020-04-11 ENCOUNTER — Other Ambulatory Visit (HOSPITAL_COMMUNITY)
Admission: RE | Admit: 2020-04-11 | Discharge: 2020-04-11 | Disposition: A | Discharge status: Home / Self Care | Payer: 59 | Source: Ambulatory Visit | Attending: Cardiovascular Disease | Admitting: Cardiovascular Disease

## 2020-04-11 DIAGNOSIS — Z20822 Contact with and (suspected) exposure to covid-19: Secondary | ICD-10-CM | POA: Diagnosis not present

## 2020-04-11 DIAGNOSIS — Z01812 Encounter for preprocedural laboratory examination: Secondary | ICD-10-CM | POA: Diagnosis present

## 2020-04-11 LAB — SARS CORONAVIRUS 2 (TAT 6-24 HRS): SARS Coronavirus 2: NEGATIVE

## 2020-04-13 ENCOUNTER — Other Ambulatory Visit: Payer: Self-pay

## 2020-04-13 ENCOUNTER — Ambulatory Visit (HOSPITAL_COMMUNITY)
Admission: RE | Admit: 2020-04-13 | Discharge: 2020-04-13 | Disposition: A | Discharge status: Home / Self Care | Payer: 59 | Source: Ambulatory Visit | Attending: Cardiology | Admitting: Cardiology

## 2020-04-13 DIAGNOSIS — R0602 Shortness of breath: Secondary | ICD-10-CM | POA: Diagnosis not present

## 2020-04-13 LAB — MYOCARDIAL PERFUSION IMAGING
LV dias vol: 110 mL (ref 62–150)
LV sys vol: 50 mL
Peak HR: 118 {beats}/min
Rest HR: 60 {beats}/min
SDS: 0
SRS: 0
SSS: 0
TID: 0.98

## 2020-04-13 MED ORDER — REGADENOSON 0.4 MG/5ML IV SOLN
0.4000 mg | Freq: Once | INTRAVENOUS | Status: AC
  Administered 2020-04-13: 0.4 mg via INTRAVENOUS

## 2020-04-13 MED ORDER — TECHNETIUM TC 99M TETROFOSMIN IV KIT
10.0000 | PACK | Freq: Once | INTRAVENOUS | Status: AC | PRN
  Administered 2020-04-13: 10 via INTRAVENOUS
  Filled 2020-04-13: qty 10

## 2020-04-13 MED ORDER — TECHNETIUM TC 99M TETROFOSMIN IV KIT
31.0000 | PACK | Freq: Once | INTRAVENOUS | Status: AC | PRN
  Administered 2020-04-13: 31 via INTRAVENOUS
  Filled 2020-04-13: qty 31

## 2020-05-15 ENCOUNTER — Other Ambulatory Visit: Payer: 59

## 2020-05-16 ENCOUNTER — Other Ambulatory Visit (INDEPENDENT_AMBULATORY_CARE_PROVIDER_SITE_OTHER): Payer: 59

## 2020-05-16 ENCOUNTER — Other Ambulatory Visit: Payer: Self-pay

## 2020-05-16 DIAGNOSIS — E782 Mixed hyperlipidemia: Secondary | ICD-10-CM

## 2020-05-16 LAB — HEPATIC FUNCTION PANEL
ALT: 44 U/L (ref 0–53)
AST: 32 U/L (ref 0–37)
Albumin: 4.2 g/dL (ref 3.5–5.2)
Alkaline Phosphatase: 65 U/L (ref 39–117)
Bilirubin, Direct: 0.1 mg/dL (ref 0.0–0.3)
Total Bilirubin: 0.6 mg/dL (ref 0.2–1.2)
Total Protein: 7.2 g/dL (ref 6.0–8.3)

## 2020-05-16 LAB — LIPID PANEL
Cholesterol: 158 mg/dL (ref 0–200)
HDL: 60.2 mg/dL (ref >39.00)
LDL Cholesterol: 63 mg/dL (ref 0–99)
NonHDL: 97.43
Total CHOL/HDL Ratio: 3
Triglycerides: 171 mg/dL — ABNORMAL HIGH (ref 0.0–149.0)
VLDL: 34.2 mg/dL (ref 0.0–40.0)

## 2020-06-28 ENCOUNTER — Other Ambulatory Visit: Payer: Self-pay

## 2020-06-28 ENCOUNTER — Ambulatory Visit (INDEPENDENT_AMBULATORY_CARE_PROVIDER_SITE_OTHER): Payer: 59 | Admitting: Internal Medicine

## 2020-06-28 ENCOUNTER — Encounter: Payer: Self-pay | Admitting: Internal Medicine

## 2020-06-28 ENCOUNTER — Telehealth: Payer: Self-pay | Admitting: Cardiovascular Disease

## 2020-06-28 VITALS — BP 118/82 | HR 74 | Temp 98.3°F | Ht 68.0 in | Wt 227.0 lb

## 2020-06-28 DIAGNOSIS — I773 Arterial fibromuscular dysplasia: Secondary | ICD-10-CM

## 2020-06-28 DIAGNOSIS — R0609 Other forms of dyspnea: Secondary | ICD-10-CM

## 2020-06-28 DIAGNOSIS — R0600 Dyspnea, unspecified: Secondary | ICD-10-CM | POA: Diagnosis not present

## 2020-06-28 DIAGNOSIS — I10 Essential (primary) hypertension: Secondary | ICD-10-CM | POA: Diagnosis not present

## 2020-06-28 NOTE — Progress Notes (Signed)
Subjective:    Patient ID: Eric Dillon, male    DOB: [birth year], 65 y.o.   MRN: [id number]  HPI The patient is here for low BP  For a while now he has had some shortness of breath and may be chest pain at times that was mild.  That was one of the reasons I referred him to cardiology and he recently had nuclear stress test, echocardiogram and coronary artery calcium score test.  In the past couple of days he has seen a significant difference in his symptoms.  After walking 30 feet he gets winded, palpitations and lightheaded.  It has been worse with going up stairs.  Those symptoms started yesterday around 11am.  He took his granddaughter to the park.  He denies chest pain.  His symptoms have never been like this.  Yesterday when he was symptomatic he checked his blood pressure, which he typically does not do.  It was 100/60.  He was unsure if that was causing his symptoms.  Yesterday he also had some discomfort in his kidney region.  He has a little discomfort there today, but has been taking Advil and its not as bad.  He denies any activity that may have caused a musculoskeletal problem/strain.  The ibuprofen is helping.  He denies any urinary symptoms.   He does not exercise on a regular basis.  Medications and allergies reviewed with patient and updated if appropriate.  Patient Active Problem List   Diagnosis Date Noted  . Fibromuscular dysplasia ([place]) 09/14/2019  . Umbilical hernia without obstruction or gangrene 09/14/2019  . DOE (dyspnea on exertion) 09/14/2019  . Subclinical hyperthyroidism 03/10/2018  . Skin cancer 09/09/2017  . Herpes zoster without complication 91/66/0600  . Diabetes ([place]) 06/08/2015  . H/O cornea transplant, left 05/30/2015  . Psoriasis 12/08/2014  . Nonspecific abnormal electrocardiogram (ECG) (EKG) 03/23/2014  . 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 Dissection of carotid artery ([place]) 01/19/2010  . 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  PROSTATE CANCER, HX OF 12/28/2008  . Personal history of colonic adenomas 01/14/2008  . HYPERLIPIDEMIA 03/03/2007  . Essential hypertension 03/03/2007  . GERD with stricture 03/03/2007  . History of CVA (cerebrovascular accident) 03/03/2007    Current Outpatient Medications on File Prior to Visit  Medication Sig Dispense Refill  . aspirin EC 81 MG tablet Take 81 mg by mouth daily.    Marland Kitchen atorvastatin (LIPITOR) 40 MG tablet Take 1 tablet by mouth daily.    Marland Kitchen esomeprazole (NEXIUM) 40 MG capsule Take 1 capsule (40 mg total) by mouth daily before breakfast. (Patient taking differently: Take 20 mg by mouth daily. Take 2 tablets daily) 90 capsule 3  . Fluorouracil (TOLAK) 4 % CREA     . hydrochlorothiazide (MICROZIDE) 12.5 MG capsule TAKE 1 CAPSULE BY MOUTH EVERY DAY 90 capsule 1  . prednisoLONE acetate (PRED FORTE) 1 % ophthalmic suspension Place 1 drop into the left eye daily.    . ramipril (ALTACE) 2.5 MG capsule TAKE 1 CAPSULE DAILY 90 capsule 3  . rosuvastatin (CRESTOR) 40 MG tablet Take 1 tablet (40 mg total) by mouth daily. 90 tablet 3  . triamcinolone cream (KENALOG) 0.1 % Apply 1 application topically 2 (two) times daily. 30 g 0  . valACYclovir (VALTREX) 1000 MG tablet Take 1,000 mg by mouth daily.    . verapamil (CALAN-SR) 240 MG CR tablet TAKE 1 TABLET DAILY 90 tablet 3   No current facility-administered medications on file prior to visit.  Past Medical History:  Diagnosis Date  . Barrett's esophagus - RESOLVED 2008   RESOLVED ON SUBSEQUENT EXAMS  . Carotid artery dissection ([place]) 2011   L  . Diverticulosis   . GERD (gastroesophageal reflux disease)   . Goiter   . Hemorrhoids   . Horner's syndrome 2012  . Hyperlipidemia   . Hypertension   . Personal history of colonic adenomas 01/14/2008  . Prostate cancer ([place]) 2010  . Stroke ([place]) 1996   FROM DISSECTING R CAROID ARTERY     Past Surgical History:  Procedure Laterality Date  . BIOPSY THYROID  2007   colloid   admixed with scant follicular epithelium  . CAROTID ARTERY ANGIOPLASTY  2011   L  . COLONOSCOPY    . COLONOSCOPY W/ BIOPSIES AND POLYPECTOMY  multiple   adenomatous polyps, internal hemorrhoids, diverticulosis  . CORNEAL TRANSPLANT Left 2015    corneal pannus & perforation,[facility]  . DISSECTION OF CAROID ARTERY     WITH CVA  . KNEE SURGERY     RIGHT..TO STRIGHTEN PATELLA  . ROBOTIC PROSTATECTOMY  2011   Dr Dutch Gray  . UPPER GASTROINTESTINAL ENDOSCOPY  multiple   w/dilation, Barrett's , esophageal ring, hiatal hernia, gastritis    Social History   Socioeconomic History  . Marital status: Married    Spouse name: Not on file  . Number of children: 2  . Years of education: Not on file  . Highest education level: Not on file  Occupational History  . Occupation: Analyst  Tobacco Use  . Smoking status: Former Smoker    Years: 30.00    Types: E-cigarettes    Quit date: 01/23/2010    Years since quitting: 10.4  . Smokeless tobacco: Never Used  . Tobacco comment: smoked 1974-2011, up to 1 ppd  Substance and Sexual Activity  . Alcohol use: Yes    Alcohol/week: 21.0 - 28.0 standard drinks    Types: 21 - 28 Cans of beer per week  . Drug use: No  . Sexual activity: Not on file  Other Topics Concern  . Not on file  Social History Narrative   Gets reg exercise   Social Determinants of Health   Financial Resource Strain: Not on file  Food Insecurity: Not on file  Transportation Needs: Not on file  Physical Activity: Not on file  Stress: Not on file  Social Connections: Not on file    Family History  Problem Relation Age of Onset  . Diabetes Mother   . Stroke Father 71       CVA,BLOOD CLOT TO BRAIN  . CAD Father        BYPASS  . Stroke Paternal Grandmother 81       MULTIPLE CVA'S; onset late70s  . Cancer Neg Hx   . Colon cancer Neg Hx   . Esophageal cancer Neg Hx   . Rectal cancer Neg Hx   . Stomach cancer Neg Hx     Review of Systems  Constitutional: Negative  for fever.  Respiratory: Positive for shortness of breath. Negative for cough and wheezing.   Cardiovascular: Positive for palpitations (with walking). Negative for chest pain and leg swelling.  Genitourinary: Negative for dysuria, frequency and hematuria.  Neurological: Positive for light-headedness. Negative for numbness and headaches.       Objective:   Vitals:   06/28/20 1434  BP: 118/82  Pulse: 74  Temp: 98.3 F (36.8 C)  SpO2: 96%   BP Readings from Last 3 Encounters:  06/28/20 118/82  03/27/20 134/74  02/28/20 130/72   Wt Readings from Last 3 Encounters:  06/28/20 227 lb (103 kg)  04/13/20 220 lb (99.8 kg)  03/27/20 220 lb (99.8 kg)   Body mass index is 34.52 kg/m.   Physical Exam    Constitutional: Appears well-developed and well-nourished. No distress.  HENT:  Head: Normocephalic and atraumatic.  Neck: Neck supple. No tracheal deviation present. No thyromegaly present.  No cervical lymphadenopathy Cardiovascular: Normal rate, regular rhythm and normal heart sounds.   No murmur heard. No carotid bruit .  No edema Pulmonary/Chest: Effort normal and breath sounds normal. No respiratory distress. No has no wheezes. No rales. Musculoskeletal: No tenderness with palpation across mid-lower back Skin: Skin is warm and dry. Not diaphoretic.       Assessment & Plan:    See Problem List for Assessment and Plan of chronic medical problems.    This visit occurred during the SARS-CoV-2 public health emergency.  Safety protocols were in place, including screening questions prior to the visit, additional usage of staff PPE, and extensive cleaning of exam room while observing appropriate contact time as indicated for disinfecting solutions.

## 2020-06-28 NOTE — Assessment & Plan Note (Addendum)
Chronic His blood pressure is on the lower side here today and was 100/60 at home yesterday, which is lower than needed This could be causing some of the symptoms, but I doubt all of them We will discontinue hydrochlorothiazide to see if that improves his blood pressure and possibly his symptoms Continue ramipril 2.5 mg daily and Cardizem 240 mg daily  He will update me on his blood pressure readings at home

## 2020-06-28 NOTE — Telephone Encounter (Signed)
I think that is fine. His BP values do not explain his symptoms. I hope his primary did some work-up.   [name] T. Audie Box, MD, [place]  [address] [place], [place] [address] [phone]  4:33 PM

## 2020-06-28 NOTE — Telephone Encounter (Signed)
Called patient, he states he seen PCP today- they suggested to contact Cardiology due to his weakness, fatigue and shortness of breath. PCP removed HCTZ from his list today, advised him to take his BP and monitor. I did advise him to let us know how he was doing in a few weeks with this change. We discussed recent testing that was completed, an appointment was also made for May 3rd, this was the soonest I could get him in. Patient aware to contact if any changes in symptoms. Patient denies chest pains and swelling. Will route to MD to make aware.

## 2020-06-28 NOTE — Telephone Encounter (Signed)
Pt c/o BP issue: STAT if pt c/o blurred vision, one-sided weakness or slurred speech  1. What are your last 5 BP readings? 100/60; 118/82  2. Are you having any other symptoms (ex. Dizziness, headache, blurred vision, passed out)? Sob, dizziness, cant walk up a flight of stairs  3. What is your BP issue? Feeling weak, his primary Dr stated he need to make an appt asap.

## 2020-06-28 NOTE — Assessment & Plan Note (Addendum)
Acute on chronic Associated with palpitations and lightheadedness For over a year he has been experiencing dyspnea on exertion, which is why he saw cardiology recently and the work-up was reassuring that there is no neck pain concerning cardiac wise causing his symptoms In the past 2 days his symptoms have significantly worsened for no reason EF normal on recent echo, stress test did not reveal any ischemia, appears euvolemic on exam I am concerned that his symptoms worsen in the last couple of days I have asked him to get in contact with cardiology to see if he needs any further testing to make sure it is not cardiac in nature Symptoms unlikely to be pulmonary in nature Will stop the hydrochlorothiazide, but I do not think low blood pressure is the cause of his current symptoms, but could be causing some lightheadedness

## 2020-06-28 NOTE — Assessment & Plan Note (Signed)
Chronic Right carotid artery-history of right carotid dissection He wonders if this has affected his kidneys-I think it is reasonable to get a renal duplex, but I do not think that is responsible for any symptoms now Renal duplex ordered

## 2020-06-28 NOTE — Patient Instructions (Addendum)
    Medications changes include :   Stop hydrochlorothiazide.    Monitor your BP and update me with your readings.   A kidney ultrasound was ordered to evaluate your arteries.    See cardiology

## 2020-06-29 ENCOUNTER — Encounter: Payer: Self-pay | Admitting: Internal Medicine

## 2020-06-29 MED ORDER — HYDROCHLOROTHIAZIDE 12.5 MG PO CAPS: ORAL_CAPSULE | ORAL | Status: DC

## 2020-07-07 ENCOUNTER — Ambulatory Visit (HOSPITAL_COMMUNITY)
Admission: RE | Admit: 2020-07-07 | Discharge: 2020-07-07 | Disposition: A | Discharge status: Home / Self Care | Payer: 59 | Source: Ambulatory Visit | Attending: Cardiovascular Disease | Admitting: Cardiovascular Disease

## 2020-07-07 ENCOUNTER — Other Ambulatory Visit: Payer: Self-pay

## 2020-07-07 DIAGNOSIS — I773 Arterial fibromuscular dysplasia: Secondary | ICD-10-CM | POA: Insufficient documentation

## 2020-07-07 DIAGNOSIS — I10 Essential (primary) hypertension: Secondary | ICD-10-CM | POA: Diagnosis not present

## 2020-07-25 ENCOUNTER — Ambulatory Visit: Payer: 59 | Admitting: Cardiovascular Disease

## 2020-08-24 ENCOUNTER — Other Ambulatory Visit: Payer: Self-pay | Admitting: Internal Medicine

## 2020-08-27 NOTE — Progress Notes (Signed)
Subjective:    Patient ID: Eric Dillon, male    DOB: 1956/03/04, 65 y.o.   MRN: [id number]  HPI The patient is here for follow up of their chronic medical problems, including htn, DM, hld, GERD  He is doing nutrisystem and has lost 10 lbs.  He is exercising.   Medications and allergies reviewed with patient and updated if appropriate.  Patient Active Problem List   Diagnosis Date Noted  . Fibromuscular dysplasia ([place]) 09/14/2019  . Umbilical hernia without obstruction or gangrene 09/14/2019  . DOE (dyspnea on exertion) 09/14/2019  . Subclinical hyperthyroidism 03/10/2018  . Skin cancer 09/09/2017  . Herpes zoster without complication 17/00/1749  . Diabetes ([place]) 06/08/2015  . H/O cornea transplant, left 05/30/2015  . Psoriasis 12/08/2014  . Nonspecific abnormal electrocardiogram (ECG) (EKG) 03/23/2014  . 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 Dissection of carotid artery ([place]) 01/19/2010  . 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 PROSTATE CANCER, HX OF 12/28/2008  . Personal history of colonic adenomas 01/14/2008  . HYPERLIPIDEMIA 03/03/2007  . Essential hypertension 03/03/2007  . GERD with stricture 03/03/2007  . History of CVA (cerebrovascular accident) 03/03/2007    Current Outpatient Medications on File Prior to Visit  Medication Sig Dispense Refill  . aspirin EC 81 MG tablet Take 81 mg by mouth daily.    Marland Kitchen esomeprazole (NEXIUM) 40 MG capsule Take 1 capsule (40 mg total) by mouth daily before breakfast. (Patient taking differently: Take 20 mg by mouth daily. Take 2 tablets daily) 90 capsule 3  . Fluorouracil (TOLAK) 4 % CREA     . hydrochlorothiazide (MICROZIDE) 12.5 MG capsule TAKE 1 CAPSULE BY MOUTH EVERY DAY 90 capsule 1  . ramipril (ALTACE) 2.5 MG capsule TAKE 1 CAPSULE DAILY 90 capsule 3  . rosuvastatin (CRESTOR) 40 MG tablet Take 1 tablet (40 mg total) by mouth daily. 90 tablet 3  . triamcinolone cream (KENALOG) 0.1 % Apply 1 application topically 2 (two)  times daily. 30 g 0  . valACYclovir (VALTREX) 1000 MG tablet Take 1,000 mg by mouth daily.    . verapamil (CALAN-SR) 240 MG CR tablet TAKE 1 TABLET DAILY 90 tablet 3   No current facility-administered medications on file prior to visit.    Past Medical History:  Diagnosis Date  . Barrett's esophagus - RESOLVED 2008   RESOLVED ON SUBSEQUENT EXAMS  . Carotid artery dissection ([place]) 2011   L  . Diverticulosis   . GERD (gastroesophageal reflux disease)   . Goiter   . Hemorrhoids   . Horner's syndrome 2012  . Hyperlipidemia   . Hypertension   . Personal history of colonic adenomas 01/14/2008  . Prostate cancer ([place]) 2010  . Stroke ([place]) 1996   FROM DISSECTING R CAROID ARTERY     Past Surgical History:  Procedure Laterality Date  . BIOPSY THYROID  2007   colloid  admixed with scant follicular epithelium  . CAROTID ARTERY ANGIOPLASTY  2011   L  . COLONOSCOPY    . COLONOSCOPY W/ BIOPSIES AND POLYPECTOMY  multiple   adenomatous polyps, internal hemorrhoids, diverticulosis  . CORNEAL TRANSPLANT Left 2015    corneal pannus & perforation,[facility]  . DISSECTION OF CAROID ARTERY     WITH CVA  . KNEE SURGERY     RIGHT..TO STRIGHTEN PATELLA  . ROBOTIC PROSTATECTOMY  2011   Dr Dutch Gray  . UPPER GASTROINTESTINAL ENDOSCOPY  multiple   w/dilation, Barrett's , esophageal ring, hiatal hernia, gastritis    Social  History   Socioeconomic History  . Marital status: Married    Spouse name: Not on file  . Number of children: 2  . Years of education: Not on file  . Highest education level: Not on file  Occupational History  . Occupation: Analyst  Tobacco Use  . Smoking status: Former Smoker    Years: 30.00    Types: E-cigarettes    Quit date: 01/23/2010    Years since quitting: 10.6  . Smokeless tobacco: Never Used  . Tobacco comment: smoked 1974-2011, up to 1 ppd  Substance and Sexual Activity  . Alcohol use: Yes    Alcohol/week: 21.0 - 28.0 standard drinks    Types: 21 - 28  Cans of beer per week  . Drug use: No  . Sexual activity: Not on file  Other Topics Concern  . Not on file  Social History Narrative   Gets reg exercise   Social Determinants of Health   Financial Resource Strain: Not on file  Food Insecurity: Not on file  Transportation Needs: Not on file  Physical Activity: Not on file  Stress: Not on file  Social Connections: Not on file    Family History  Problem Relation Age of Onset  . Diabetes Mother   . Stroke Father 80       CVA,BLOOD CLOT TO BRAIN  . CAD Father        BYPASS  . Stroke Paternal Grandmother 81       MULTIPLE CVA'S; onset late70s  . Cancer Neg Hx   . Colon cancer Neg Hx   . Esophageal cancer Neg Hx   . Rectal cancer Neg Hx   . Stomach cancer Neg Hx     Review of Systems  Constitutional: Negative for chills and fever.  Respiratory: Negative for cough, shortness of breath and wheezing.   Cardiovascular: Negative for chest pain, palpitations and leg swelling.  Neurological: Negative for light-headedness and headaches.       Objective:   Vitals:   08/28/20 1410  BP: 136/70  Pulse: 70  Temp: 98.3 F (36.8 C)  SpO2: 97%   BP Readings from Last 3 Encounters:  08/28/20 136/70  06/28/20 118/82  03/27/20 134/74   Wt Readings from Last 3 Encounters:  08/28/20 217 lb (98.4 kg)  06/28/20 227 lb (103 kg)  04/13/20 220 lb (99.8 kg)   Body mass index is 32.99 kg/m.   Physical Exam    Constitutional: Appears well-developed and well-nourished. No distress.  HENT:  Head: Normocephalic and atraumatic.  Neck: Neck supple. No tracheal deviation present. No thyromegaly present.  No cervical lymphadenopathy Cardiovascular: Normal rate, regular rhythm and normal heart sounds.   No murmur heard. No carotid bruit .  No edema Pulmonary/Chest: Effort normal and breath sounds normal. No respiratory distress. No has no wheezes. No rales.  Skin: Skin is warm and dry. Not diaphoretic.  Psychiatric: Normal mood and  affect. Behavior is normal.      Assessment & Plan:    See Problem List for Assessment and Plan of chronic medical problems.    This visit occurred during the SARS-CoV-2 public health emergency.  Safety protocols were in place, including screening questions prior to the visit, additional usage of staff PPE, and extensive cleaning of exam room while observing appropriate contact time as indicated for disinfecting solutions.

## 2020-08-27 NOTE — Patient Instructions (Addendum)
    Blood work was ordered.      Medications changes include :   none     Please followup in 6 months  

## 2020-08-28 ENCOUNTER — Ambulatory Visit (INDEPENDENT_AMBULATORY_CARE_PROVIDER_SITE_OTHER): Payer: 59 | Admitting: Internal Medicine

## 2020-08-28 ENCOUNTER — Encounter: Payer: Self-pay | Admitting: Internal Medicine

## 2020-08-28 ENCOUNTER — Other Ambulatory Visit: Payer: Self-pay

## 2020-08-28 VITALS — BP 136/70 | HR 70 | Temp 98.3°F | Ht 68.0 in | Wt 217.0 lb

## 2020-08-28 DIAGNOSIS — K219 Gastro-esophageal reflux disease without esophagitis: Secondary | ICD-10-CM | POA: Diagnosis not present

## 2020-08-28 DIAGNOSIS — E782 Mixed hyperlipidemia: Secondary | ICD-10-CM

## 2020-08-28 DIAGNOSIS — I10 Essential (primary) hypertension: Secondary | ICD-10-CM | POA: Diagnosis not present

## 2020-08-28 DIAGNOSIS — E1169 Type 2 diabetes mellitus with other specified complication: Secondary | ICD-10-CM

## 2020-08-28 DIAGNOSIS — K222 Esophageal obstruction: Secondary | ICD-10-CM

## 2020-08-28 LAB — HEMOGLOBIN A1C: Hgb A1c MFr Bld: 6.5 % (ref 4.6–6.5)

## 2020-08-28 NOTE — Assessment & Plan Note (Signed)
Chronic GERD controlled Continue nexium 40 mg daily  

## 2020-08-28 NOTE — Assessment & Plan Note (Signed)
Chronic Lab Results  Component Value Date   HGBA1C 6.5 02/28/2020   Controlled Working on weight loss Diabetic diet, continue exercise and weight loss efforts Diet controlled

## 2020-08-28 NOTE — Assessment & Plan Note (Signed)
Chronic Check lipid panel  Continue crestor 40 mg daily Regular exercise and healthy diet encouraged

## 2020-08-28 NOTE — Assessment & Plan Note (Signed)
Chronic BP well controlled Continue hctz 12.5 mg qd, ramipril 2.5 mg qd, verapamil 240 mg qd cmp

## 2020-08-29 LAB — COMPREHENSIVE METABOLIC PANEL
ALT: 38 U/L (ref 0–53)
AST: 32 U/L (ref 0–37)
Albumin: 4.4 g/dL (ref 3.5–5.2)
Alkaline Phosphatase: 60 U/L (ref 39–117)
BUN: 13 mg/dL (ref 6–23)
CO2: 28 mEq/L (ref 19–32)
Calcium: 9.8 mg/dL (ref 8.4–10.5)
Chloride: 96 mEq/L (ref 96–112)
Creatinine, Ser: 0.92 mg/dL (ref 0.40–1.50)
GFR: 88.38 mL/min (ref >60.00)
Glucose, Bld: 123 mg/dL — ABNORMAL HIGH (ref 70–99)
Potassium: 4.5 mEq/L (ref 3.5–5.1)
Sodium: 134 mEq/L — ABNORMAL LOW (ref 135–145)
Total Bilirubin: 0.3 mg/dL (ref 0.2–1.2)
Total Protein: 7.4 g/dL (ref 6.0–8.3)

## 2020-08-29 LAB — LIPID PANEL
Cholesterol: 140 mg/dL (ref 0–200)
HDL: 72.6 mg/dL (ref >39.00)
LDL Cholesterol: 51 mg/dL (ref 0–99)
NonHDL: 67.41
Total CHOL/HDL Ratio: 2
Triglycerides: 84 mg/dL (ref 0.0–149.0)
VLDL: 16.8 mg/dL (ref 0.0–40.0)

## 2020-10-22 NOTE — Progress Notes (Signed)
Cardiology Office Note:   Date:  10/23/2020  NAME:  Eric Dillon    MRN: [id number] DOB:  1955/09/17   PCP:  Binnie Rail, MD  Cardiologist:  None  Electrophysiologist:  None   Referring MD: Binnie Rail, MD   Chief Complaint  Patient presents with   Follow-up        History of Present Illness:   Eric Dillon is a 65 y.o. male with a hx of non-obstructive CAD, FMD, carotid artery disease, HLD who presents for follow-up.  He reports he is doing well.  He has lost roughly 20 pounds over the last few months.  He reports his shortness of breath has improved.  We went over the results of his stress test as well as calcium score.  He has elevated calcium score but normal perfusion imaging.  His most recent LDL cholesterol is 51.  He is tolerating Crestor 40 mg daily well without any issues.  He is on aspirin 81 mg daily.  He does have a history of carotid artery dissection.  Most recent carotid ultrasound shows minimal plaque in the right carotid artery.  He will need a yearly follow-up of this.  Renal ultrasounds are negative for any evidence of FMD.  He denies any significant chest pain.  EKG shows sinus rhythm without ischemic changes or evidence of prior infarction.  He overall is doing quite well.  Walking several miles per day.  He is working on his diet.  He reports that the Nutrisystem's what he is using.  He is watching carbohydrates.  He is continuing to do well with retirement.   Problem List 1. R ICA dissection (FMD)/CVA -1997 -2011 2. HLD -T chol 140, HDL 72, LDL 51, triglycerides 84 3. DM -A1c 6.5 4. Former Tobacco abuse 5. Carotid Artery disease -R ICA 1-39% 03/07/2020 6. CAD -Calcium score 253 (76th percentile) -normal MPI 04/13/2020  Past Medical History: Past Medical History:  Diagnosis Date   Barrett's esophagus - RESOLVED 2008   RESOLVED ON SUBSEQUENT EXAMS   Carotid artery dissection ([place]) 2011   L   Diverticulosis    GERD (gastroesophageal reflux  disease)    Goiter    Hemorrhoids    Horner's syndrome 2012   Hyperlipidemia    Hypertension    Personal history of colonic adenomas 01/14/2008   Prostate cancer ([place]) 2010   Stroke ([place]) 1996   FROM DISSECTING R CAROID ARTERY     Past Surgical History: Past Surgical History:  Procedure Laterality Date   BIOPSY THYROID  2007   colloid  admixed with scant follicular epithelium   CAROTID ARTERY ANGIOPLASTY  2011   L   COLONOSCOPY     COLONOSCOPY W/ BIOPSIES AND POLYPECTOMY  multiple   adenomatous polyps, internal hemorrhoids, diverticulosis   CORNEAL TRANSPLANT Left 2015    corneal pannus & perforation,[facility]   DISSECTION OF CAROID ARTERY     WITH CVA   KNEE SURGERY     RIGHT..TO STRIGHTEN PATELLA   ROBOTIC PROSTATECTOMY  2011   Dr Dutch Gray   UPPER GASTROINTESTINAL ENDOSCOPY  multiple   w/dilation, Barrett's , esophageal ring, hiatal hernia, gastritis    Current Medications: Current Meds  Medication Sig   aspirin EC 81 MG tablet Take 81 mg by mouth daily.   esomeprazole (NEXIUM) 40 MG capsule Take 1 capsule (40 mg total) by mouth daily before breakfast. (Patient taking differently: Take 20 mg by mouth daily. Take 2 tablets daily)  Fluorouracil (TOLAK) 4 % CREA    hydrochlorothiazide (MICROZIDE) 12.5 MG capsule TAKE 1 CAPSULE BY MOUTH EVERY DAY   ramipril (ALTACE) 2.5 MG capsule TAKE 1 CAPSULE DAILY   rosuvastatin (CRESTOR) 40 MG tablet Take 1 tablet (40 mg total) by mouth daily.   triamcinolone cream (KENALOG) 0.1 % Apply 1 application topically 2 (two) times daily.   valACYclovir (VALTREX) 1000 MG tablet Take 1,000 mg by mouth daily.   verapamil (CALAN-SR) 240 MG CR tablet TAKE 1 TABLET DAILY     Allergies:    Amoxicillin, Codeine, Sulfonamide derivatives, Erythromycin, and Vitamin b50 complex [balanced b-100]   Social History: Social History   Socioeconomic History   Marital status: Married    Spouse name: Not on file   Number of children: 2   Years of  education: Not on file   Highest education level: Not on file  Occupational History   Occupation: Analyst  Tobacco Use   Smoking status: Former    Types: E-cigarettes    Quit date: 01/23/2010    Years since quitting: 10.7   Smokeless tobacco: Never   Tobacco comments:    smoked 1974-2011, up to 1 ppd  Substance and Sexual Activity   Alcohol use: Yes    Alcohol/week: 21.0 - 28.0 standard drinks    Types: 21 - 28 Cans of beer per week   Drug use: No   Sexual activity: Not on file  Other Topics Concern   Not on file  Social History Narrative   Gets reg exercise   Social Determinants of Health   Financial Resource Strain: Not on file  Food Insecurity: Not on file  Transportation Needs: Not on file  Physical Activity: Not on file  Stress: Not on file  Social Connections: Not on file     Family History: The patient's family history includes CAD in his father; Diabetes in his mother; Stroke (age of onset: 15) in his father; Stroke (age of onset: 29) in his paternal grandmother. There is no history of Cancer, Colon cancer, Esophageal cancer, Rectal cancer, or Stomach cancer.  ROS:   All other ROS reviewed and negative. Pertinent positives noted in the HPI.     EKGs/Labs/Other Studies Reviewed:   The following studies were personally reviewed by me today:  EKG:  EKG is ordered today.  The ekg ordered today demonstrates normal sinus rhythm heart rate 63, no acute ischemic changes or evidence of infarction, and was personally reviewed by me.   NM Stress 04/13/2020  The left ventricular ejection fraction is normal (55-65%). Nuclear stress EF: 55%. There was no ST segment deviation noted during stress. The study is normal. This is a low risk study. There is no evidence of ischemia or infarction.   TTE 10/13/2019   1. Left ventricular ejection fraction, by estimation, is 60 to 65%. The  left ventricle has normal function. The left ventricle has no regional  wall motion  abnormalities. Left ventricular diastolic parameters were  normal.   2. Right ventricular systolic function is normal. The right ventricular  size is normal. There is normal pulmonary artery systolic pressure. The  estimated right ventricular systolic pressure is 0000000 mmHg.   3. The mitral valve is grossly normal. No evidence of mitral valve  regurgitation. No evidence of mitral stenosis.   4. The aortic valve is tricuspid. Aortic valve regurgitation is not  visualized. No aortic stenosis is present.   5. The inferior vena cava is normal in size with greater than  50%  respiratory variability, suggesting right atrial pressure of 3 mmHg.  Recent Labs: 02/28/2020: Hemoglobin 14.8; Platelets 259.0; TSH 0.38 08/28/2020: ALT 38; BUN 13; Creatinine, Ser 0.92; Potassium 4.5; Sodium 134   Recent Lipid Panel    Component Value Date/Time   CHOL 140 08/28/2020 1422   CHOL 191 06/23/2013 0926   TRIG 84.0 08/28/2020 1422   TRIG 123 06/23/2013 0926   HDL 72.60 08/28/2020 1422   HDL 71 06/23/2013 0926   CHOLHDL 2 08/28/2020 1422   VLDL 16.8 08/28/2020 1422   LDLCALC 51 08/28/2020 1422   LDLCALC 95 06/23/2013 0926   LDLDIRECT 84.0 02/28/2020 1348    Physical Exam:   VS:  BP (!) 150/72   Pulse 63   Ht '5\' 8"'$  (1.727 m)   Wt 207 lb 12.8 oz (94.3 kg)   BMI 31.60 kg/m    Wt Readings from Last 3 Encounters:  10/23/20 207 lb 12.8 oz (94.3 kg)  08/28/20 217 lb (98.4 kg)  06/28/20 227 lb (103 kg)    General: Well nourished, well developed, in no acute distress Head: Atraumatic, normal size  Eyes: PEERLA, EOMI  Neck: Supple, no JVD Endocrine: No thryomegaly Cardiac: Normal S1, S2; RRR; no murmurs, rubs, or gallops Lungs: Clear to auscultation bilaterally, no wheezing, rhonchi or rales  Abd: Soft, nontender, no hepatomegaly  Ext: No edema, pulses 2+ Musculoskeletal: No deformities, BUE and BLE strength normal and equal Skin: Warm and dry, no rashes   Neuro: Alert and oriented to person, place,  time, and situation, CNII-XII grossly intact, no focal deficits  Psych: Normal mood and affect   ASSESSMENT:   Eric Dillon is a 65 y.o. male who presents for the following: 1. Coronary artery disease involving native coronary artery of native heart without angina pectoris   2. Agatston coronary artery calcium score between 200 and 399   3. Mixed hyperlipidemia   4. Essential hypertension   5. Fibromuscular dysplasia ([facility])   6. Stenosis of right carotid artery     PLAN:   1. Coronary artery disease involving native coronary artery of native heart without angina pectoris 2. Agatston coronary artery calcium score between 200 and 399 3. Mixed hyperlipidemia -Coronary calcium score to 53 which is 76 percentile.  Normal MPI on 04/13/2020.  He denies any symptoms of chest pain.  Symptoms of shortness of breath have improved with weight loss.  He has lost 20 pounds.  Symptoms have improved.  Would recommend he continue aspirin and statin therapy.  Most recent LDL 51 which is at goal.  Denies any symptoms of obstructive CAD.  Overall doing quite well.  4. Essential hypertension -Blood pressure slightly elevated today.  Reports well controlled at other times.  Review of values in the last several visits shows it is well controlled.  He will keep an eye on this.  5. Fibromuscular dysplasia ([place]) 6. Stenosis of right carotid artery -History of right carotid artery dissection in 1997 in 2011.  He carries a diagnosis of FMD.  Renal ultrasounds showed no evidence of stenosis.  At his age I would not expect this to progress.  Most recent right carotid ultrasound last year shows a 1-39% stenosis.  We will plan to recheck carotid ultrasounds in 1 year.  Likely will go to 2 years after that.  He is on aspirin and statin.  Cholesterol level is at goal.  Disposition: Return in about 1 year (around 10/23/2021).  Medication Adjustments/Labs and Tests Ordered: Current medicines are  reviewed at length with the  patient today.  Concerns regarding medicines are outlined above.  Orders Placed This Encounter  Procedures   EKG 12-Lead   VAS US CAROTID    No orders of the defined types were placed in this encounter.   Patient Instructions  Medication Instructions:  No Changes In Medications at this time.  *If you need a refill on your cardiac medications before your next appointment, please call your pharmacy*  Testing/Procedures: Your physician has requested that you have a carotid duplex IN ONE YEAR . This test is an ultrasound of the carotid arteries in your neck. It looks at blood flow through these arteries that supply the brain with blood. Allow one hour for this exam. There are no restrictions or special instructions.  Follow-Up: At [facility], you and your health needs are our priority.  As part of our continuing mission to provide you with exceptional heart care, we have created designated Provider Care Teams.  These Care Teams include your primary Cardiologist (physician) and Advanced Practice Providers (APPs -  Physician Assistants and Nurse Practitioners) who all work together to provide you with the care you need, when you need it.  Your next appointment:   1 year(s)  The format for your next appointment:   In Person  Provider:   You may see Dr. Audie Box  or one of the following Advanced Practice Providers on your designated Care Team:   Almyra Deforest, PA-C Fabian Sharp, [place] or  Roby Lofts, PA-C   Time Spent with Patient: I have spent a total of 35 minutes with patient reviewing hospital notes, telemetry, EKGs, labs and examining the patient as well as establishing an assessment and plan that was discussed with the patient.  > 50% of time was spent in direct patient care.  Signed, Addison Naegeli. Audie Box, MD, [place]  [address], [place], [place] [address] [phone]  10/23/2020 4:39 PM

## 2020-10-23 ENCOUNTER — Encounter: Payer: Self-pay | Admitting: Cardiovascular Disease

## 2020-10-23 ENCOUNTER — Ambulatory Visit (INDEPENDENT_AMBULATORY_CARE_PROVIDER_SITE_OTHER): Payer: 59 | Admitting: Cardiovascular Disease

## 2020-10-23 ENCOUNTER — Other Ambulatory Visit: Payer: Self-pay

## 2020-10-23 VITALS — BP 150/72 | HR 63 | Ht 68.0 in | Wt 207.8 lb

## 2020-10-23 DIAGNOSIS — I2510 Atherosclerotic heart disease of native coronary artery without angina pectoris: Secondary | ICD-10-CM

## 2020-10-23 DIAGNOSIS — I773 Arterial fibromuscular dysplasia: Secondary | ICD-10-CM

## 2020-10-23 DIAGNOSIS — R931 Abnormal findings on diagnostic imaging of heart and coronary circulation: Secondary | ICD-10-CM

## 2020-10-23 DIAGNOSIS — I6521 Occlusion and stenosis of right carotid artery: Secondary | ICD-10-CM

## 2020-10-23 DIAGNOSIS — E782 Mixed hyperlipidemia: Secondary | ICD-10-CM | POA: Diagnosis not present

## 2020-10-23 DIAGNOSIS — I10 Essential (primary) hypertension: Secondary | ICD-10-CM | POA: Diagnosis not present

## 2020-10-23 NOTE — Patient Instructions (Signed)
Medication Instructions:  No Changes In Medications at this time.  *If you need a refill on your cardiac medications before your next appointment, please call your pharmacy*  Testing/Procedures: Your physician has requested that you have a carotid duplex IN ONE YEAR . This test is an ultrasound of the carotid arteries in your neck. It looks at blood flow through these arteries that supply the brain with blood. Allow one hour for this exam. There are no restrictions or special instructions.  Follow-Up: At [facility], you and your health needs are our priority.  As part of our continuing mission to provide you with exceptional heart care, we have created designated Provider Care Teams.  These Care Teams include your primary Cardiologist (physician) and Advanced Practice Providers (APPs -  Physician Assistants and Nurse Practitioners) who all work together to provide you with the care you need, when you need it.  Your next appointment:   1 year(s)  The format for your next appointment:   In Person  Provider:   You may see Dr. Audie Box  or one of the following Advanced Practice Providers on your designated Care Team:   Almyra Deforest, PA-C Fabian Sharp, PA-C or  Roby Lofts, [place]

## 2020-11-09 ENCOUNTER — Other Ambulatory Visit: Payer: Self-pay | Admitting: Internal Medicine

## 2021-02-15 ENCOUNTER — Other Ambulatory Visit: Payer: Self-pay | Admitting: Internal Medicine

## 2021-02-16 ENCOUNTER — Other Ambulatory Visit: Payer: Self-pay | Admitting: Internal Medicine

## 2021-02-26 ENCOUNTER — Encounter: Payer: Self-pay | Admitting: Internal Medicine

## 2021-02-26 NOTE — Progress Notes (Signed)
Subjective:    Patient ID: Eric Dillon, male    DOB: 1955-10-07, 65 y.o.   MRN: [id number]  This visit occurred during the SARS-CoV-2 public health emergency.  Safety protocols were in place, including screening questions prior to the visit, additional usage of staff PPE, and extensive cleaning of exam room while observing appropriate contact time as indicated for disinfecting solutions.     HPI The patient is here for follow up of their chronic medical problems, including htn, DM, hld, gerd, h/o subclinical hyperthyroidism  he is working on trying to exercise.   Medications and allergies reviewed with patient and updated if appropriate.  Patient Active Problem List   Diagnosis Date Noted   Fibromuscular dysplasia ([place]) 22/04/5425   Umbilical hernia without obstruction or gangrene 09/14/2019   DOE (dyspnea on exertion) 09/14/2019   Subclinical hyperthyroidism 03/10/2018   Skin cancer 09/09/2017   Herpes zoster without complication 09/15/7626   Diabetes ([place]) 06/08/2015   H/O cornea transplant, left 05/30/2015   Psoriasis 12/08/2014   Nonspecific abnormal electrocardiogram (ECG) (EKG) 03/23/2014   Interstitial keratitis of left eye 06/18/2013   Dissection of carotid artery ([place]) 01/19/2010   HORNER'S SYNDROME 01/04/2010   PROSTATE CANCER, HX OF 12/28/2008   Personal history of colonic adenomas 01/14/2008   HYPERLIPIDEMIA 03/03/2007   Essential hypertension 03/03/2007   GERD with stricture 03/03/2007   History of CVA (cerebrovascular accident) 03/03/2007    Current Outpatient Medications on File Prior to Visit  Medication Sig Dispense Refill   aspirin EC 81 MG tablet Take 81 mg by mouth daily.     esomeprazole (NEXIUM) 40 MG capsule Take 1 capsule (40 mg total) by mouth daily before breakfast. (Patient taking differently: Take 20 mg by mouth daily. Take 2 tablets daily) 90 capsule 3   hydrochlorothiazide (MICROZIDE) 12.5 MG capsule TAKE 1 CAPSULE BY MOUTH EVERY DAY  90 capsule 1   prednisoLONE acetate (PRED FORTE) 1 % ophthalmic suspension Place 1 drop into the left eye daily.     ramipril (ALTACE) 2.5 MG capsule TAKE 1 CAPSULE DAILY 90 capsule 3   rosuvastatin (CRESTOR) 40 MG tablet TAKE 1 TABLET DAILY 90 tablet 3   triamcinolone cream (KENALOG) 0.1 % Apply 1 application topically 2 (two) times daily. 30 g 0   valACYclovir (VALTREX) 1000 MG tablet Take 1,000 mg by mouth daily.     verapamil (CALAN-SR) 240 MG CR tablet TAKE 1 TABLET DAILY 90 tablet 3   No current facility-administered medications on file prior to visit.    Past Medical History:  Diagnosis Date   Barrett's esophagus - RESOLVED 2008   RESOLVED ON SUBSEQUENT EXAMS   Carotid artery dissection ([place]) 2011   L   Diverticulosis    GERD (gastroesophageal reflux disease)    Goiter    Hemorrhoids    Horner's syndrome 2012   Hyperlipidemia    Hypertension    Personal history of colonic adenomas 01/14/2008   Prostate cancer ([place]) 2010   Stroke ([place]) 1996   FROM DISSECTING R CAROID ARTERY     Past Surgical History:  Procedure Laterality Date   BIOPSY THYROID  2007   colloid  admixed with scant follicular epithelium   CAROTID ARTERY ANGIOPLASTY  2011   L   COLONOSCOPY     COLONOSCOPY W/ BIOPSIES AND POLYPECTOMY  multiple   adenomatous polyps, internal hemorrhoids, diverticulosis   CORNEAL TRANSPLANT Left 2015    corneal pannus & perforation,[facility]   DISSECTION OF  CAROID ARTERY     WITH CVA   KNEE SURGERY     RIGHT..TO STRIGHTEN PATELLA   ROBOTIC PROSTATECTOMY  2011   Dr Dutch Gray   UPPER GASTROINTESTINAL ENDOSCOPY  multiple   w/dilation, Barrett's , esophageal ring, hiatal hernia, gastritis    Social History   Socioeconomic History   Marital status: Married    Spouse name: Not on file   Number of children: 2   Years of education: Not on file   Highest education level: Not on file  Occupational History   Occupation: Analyst  Tobacco Use   Smoking status: Former     Types: E-cigarettes    Quit date: 01/23/2010    Years since quitting: 11.1   Smokeless tobacco: Never   Tobacco comments:    smoked 1974-2011, up to 1 ppd  Substance and Sexual Activity   Alcohol use: Yes    Alcohol/week: 21.0 - 28.0 standard drinks    Types: 21 - 28 Cans of beer per week   Drug use: No   Sexual activity: Not on file  Other Topics Concern   Not on file  Social History Narrative   Gets reg exercise   Social Determinants of Health   Financial Resource Strain: Not on file  Food Insecurity: Not on file  Transportation Needs: Not on file  Physical Activity: Not on file  Stress: Not on file  Social Connections: Not on file    Family History  Problem Relation Age of Onset   Diabetes Mother    Stroke Father 102       CVA,BLOOD CLOT TO BRAIN   CAD Father        BYPASS   Stroke Paternal Grandmother 81       [place]; onset late70s   Cancer Neg Hx    Colon cancer Neg Hx    Esophageal cancer Neg Hx    Rectal cancer Neg Hx    Stomach cancer Neg Hx     Review of Systems  Constitutional:  Negative for chills and fever.  Respiratory:  Negative for cough, shortness of breath and wheezing.   Cardiovascular:  Negative for chest pain, palpitations and leg swelling.  Neurological:  Negative for light-headedness and headaches.      Objective:   Vitals:   02/27/21 1339  BP: 130/80  Pulse: 63  Temp: 98.5 F (36.9 C)  SpO2: 97%   BP Readings from Last 3 Encounters:  02/27/21 130/80  10/23/20 (!) 150/72  08/28/20 136/70   Wt Readings from Last 3 Encounters:  02/27/21 206 lb (93.4 kg)  10/23/20 207 lb 12.8 oz (94.3 kg)  08/28/20 217 lb (98.4 kg)   Body mass index is 31.32 kg/m.   Physical Exam    Constitutional: Appears well-developed and well-nourished. No distress.  HENT:  Head: Normocephalic and atraumatic.  Neck: Neck supple. No tracheal deviation present. No thyromegaly present.  No cervical lymphadenopathy Cardiovascular: Normal rate,  regular rhythm and normal heart sounds.   No murmur heard. No carotid bruit .  No edema Pulmonary/Chest: Effort normal and breath sounds normal. No respiratory distress. No has no wheezes. No rales.  Skin: Skin is warm and dry. Not diaphoretic.  Psychiatric: Normal mood and affect. Behavior is normal.      Assessment & Plan:    See Problem List for Assessment and Plan of chronic medical problems.

## 2021-02-26 NOTE — Patient Instructions (Addendum)
    Blood work was ordered.      Medications changes include :   none     Please followup in 6 months  

## 2021-02-27 ENCOUNTER — Ambulatory Visit (INDEPENDENT_AMBULATORY_CARE_PROVIDER_SITE_OTHER): Payer: 59 | Admitting: Internal Medicine

## 2021-02-27 ENCOUNTER — Other Ambulatory Visit: Payer: Self-pay

## 2021-02-27 VITALS — BP 130/80 | HR 63 | Temp 98.5°F | Ht 68.0 in | Wt 206.0 lb

## 2021-02-27 DIAGNOSIS — K219 Gastro-esophageal reflux disease without esophagitis: Secondary | ICD-10-CM

## 2021-02-27 DIAGNOSIS — E0590 Thyrotoxicosis, unspecified without thyrotoxic crisis or storm: Secondary | ICD-10-CM

## 2021-02-27 DIAGNOSIS — E1169 Type 2 diabetes mellitus with other specified complication: Secondary | ICD-10-CM | POA: Diagnosis not present

## 2021-02-27 DIAGNOSIS — Z8673 Personal history of transient ischemic attack (TIA), and cerebral infarction without residual deficits: Secondary | ICD-10-CM

## 2021-02-27 DIAGNOSIS — E782 Mixed hyperlipidemia: Secondary | ICD-10-CM

## 2021-02-27 DIAGNOSIS — I10 Essential (primary) hypertension: Secondary | ICD-10-CM | POA: Diagnosis not present

## 2021-02-27 DIAGNOSIS — K222 Esophageal obstruction: Secondary | ICD-10-CM

## 2021-02-27 LAB — COMPREHENSIVE METABOLIC PANEL
ALT: 43 U/L (ref 0–53)
AST: 34 U/L (ref 0–37)
Albumin: 4.2 g/dL (ref 3.5–5.2)
Alkaline Phosphatase: 64 U/L (ref 39–117)
BUN: 14 mg/dL (ref 6–23)
CO2: 28 mEq/L (ref 19–32)
Calcium: 9.7 mg/dL (ref 8.4–10.5)
Chloride: 101 mEq/L (ref 96–112)
Creatinine, Ser: 1.1 mg/dL (ref 0.40–1.50)
GFR: 71.07 mL/min (ref >60.00)
Glucose, Bld: 116 mg/dL — ABNORMAL HIGH (ref 70–99)
Potassium: 4.1 mEq/L (ref 3.5–5.1)
Sodium: 138 mEq/L (ref 135–145)
Total Bilirubin: 0.4 mg/dL (ref 0.2–1.2)
Total Protein: 7.3 g/dL (ref 6.0–8.3)

## 2021-02-27 LAB — TSH: TSH: 0.34 u[IU]/mL — ABNORMAL LOW (ref 0.35–5.50)

## 2021-02-27 LAB — HEMOGLOBIN A1C: Hgb A1c MFr Bld: 5.9 % (ref 4.6–6.5)

## 2021-02-27 LAB — LIPID PANEL
Cholesterol: 170 mg/dL (ref 0–200)
HDL: 88.4 mg/dL (ref >39.00)
LDL Cholesterol: 62 mg/dL (ref 0–99)
NonHDL: 81.31
Total CHOL/HDL Ratio: 2
Triglycerides: 96 mg/dL (ref 0.0–149.0)
VLDL: 19.2 mg/dL (ref 0.0–40.0)

## 2021-02-27 NOTE — Assessment & Plan Note (Signed)
Chronic Regular exercise and healthy diet encouraged Check lipid panel  Continue rosuvastatin 40 mg daily 

## 2021-02-27 NOTE — Assessment & Plan Note (Signed)
Chronic Has not needed treatment-thyroid antibodies negative Check TSH today

## 2021-02-27 NOTE — Assessment & Plan Note (Signed)
Chronic With residual tinnitus Continue ASA 81 mg daily, crestor 40 mg daily BP well controlled

## 2021-02-27 NOTE — Assessment & Plan Note (Signed)
Chronic With hyperlipidemia and GERD Lab Results  Component Value Date   HGBA1C 6.5 08/28/2020   Has been well controlled with diet Check A1c today

## 2021-02-27 NOTE — Assessment & Plan Note (Signed)
Chronic Blood pressure well controlled CMP Continue hydrochlorothiazide 12.5 mg daily, ramipril 2.5 mg daily, verapamil 240 mg daily

## 2021-02-27 NOTE — Assessment & Plan Note (Addendum)
Chronic GERD controlled, no dysphasia Continue Nexium 40 mg daily

## 2021-08-22 ENCOUNTER — Other Ambulatory Visit: Payer: Self-pay | Admitting: Internal Medicine

## 2021-08-28 ENCOUNTER — Encounter: Payer: Self-pay | Admitting: Internal Medicine

## 2021-08-28 NOTE — Progress Notes (Unsigned)
Subjective:    Patient ID: Eric Dillon, male    DOB: 1955/05/29, 66 y.o.   MRN: [id number]     HPI Arif is here for follow up of his chronic medical problems, including htn, DM, hld, gerd, h/o subclinical hyperthyroidism  He is taking his medications daily as prescribed.  He is not exercising regularly.  He is compliant with a diabetic diet.  He is having b/l foot discomfort with walking - has a lump b/l in the ball of his feet - it is getting worse  Medications and allergies reviewed with patient and updated if appropriate.  Current Outpatient Medications on File Prior to Visit  Medication Sig Dispense Refill   aspirin EC 81 MG tablet Take 81 mg by mouth daily.     esomeprazole (NEXIUM) 40 MG capsule Take 1 capsule (40 mg total) by mouth daily before breakfast. (Patient taking differently: Take 20 mg by mouth daily. Take 2 tablets daily) 90 capsule 3   hydrochlorothiazide (MICROZIDE) 12.5 MG capsule TAKE 1 CAPSULE BY MOUTH EVERY DAY 90 capsule 1   prednisoLONE acetate (PRED FORTE) 1 % ophthalmic suspension Place 1 drop into the left eye daily.     ramipril (ALTACE) 2.5 MG capsule TAKE 1 CAPSULE DAILY 90 capsule 3   rosuvastatin (CRESTOR) 40 MG tablet TAKE 1 TABLET DAILY 90 tablet 3   triamcinolone cream (KENALOG) 0.1 % Apply 1 application topically 2 (two) times daily. 30 g 0   valACYclovir (VALTREX) 1000 MG tablet Take 1,000 mg by mouth daily.     verapamil (CALAN-SR) 240 MG CR tablet TAKE 1 TABLET DAILY 90 tablet 3   No current facility-administered medications on file prior to visit.     Review of Systems  Constitutional:  Negative for fever.  Respiratory:  Negative for cough, shortness of breath and wheezing.   Cardiovascular:  Negative for chest pain, palpitations and leg swelling.  Neurological:  Negative for light-headedness and headaches.      Objective:   Vitals:   08/29/21 1355  BP: 132/76  Pulse: 62  Temp: 98.1 F (36.7 C)  SpO2: 99%   BP  Readings from Last 3 Encounters:  08/29/21 132/76  02/27/21 130/80  10/23/20 (!) 150/72   Wt Readings from Last 3 Encounters:  08/29/21 215 lb (97.5 kg)  02/27/21 206 lb (93.4 kg)  10/23/20 207 lb 12.8 oz (94.3 kg)   Body mass index is 32.69 kg/m.    Physical Exam Constitutional:      General: He is not in acute distress.    Appearance: Normal appearance. He is not ill-appearing.  HENT:     Head: Normocephalic and atraumatic.  Eyes:     Conjunctiva/sclera: Conjunctivae normal.  Cardiovascular:     Rate and Rhythm: Normal rate and regular rhythm.     Heart sounds: Normal heart sounds. No murmur heard. Pulmonary:     Effort: Pulmonary effort is normal. No respiratory distress.     Breath sounds: Normal breath sounds. No wheezing or rales.  Musculoskeletal:     Right lower leg: No edema.     Left lower leg: No edema.  Skin:    General: Skin is warm and dry.     Findings: No rash.  Neurological:     Mental Status: He is alert. Mental status is at baseline.  Psychiatric:        Mood and Affect: Mood normal.       Lab Results  Component Value  Date   WBC 7.9 02/28/2020   HGB 14.8 02/28/2020   HCT 43.2 02/28/2020   PLT 259.0 02/28/2020   GLUCOSE 116 (H) 02/27/2021   CHOL 170 02/27/2021   TRIG 96.0 02/27/2021   HDL 88.40 02/27/2021   LDLDIRECT 84.0 02/28/2020   LDLCALC 62 02/27/2021   ALT 43 02/27/2021   AST 34 02/27/2021   NA 138 02/27/2021   K 4.1 02/27/2021   CL 101 02/27/2021   CREATININE 1.10 02/27/2021   BUN 14 02/27/2021   CO2 28 02/27/2021   TSH 0.34 (L) 02/27/2021   PSA 2.24 03/03/2007   INR 0.93 01/22/2010   HGBA1C 5.9 02/27/2021   MICROALBUR 2.0 (H) 06/06/2016     Assessment & Plan:    See Problem List for Assessment and Plan of chronic medical problems.

## 2021-08-28 NOTE — Patient Instructions (Addendum)
     Blood work was ordered.     Medications changes include :   none    A referral was ordered for podiatry.     Someone from that office will call you to schedule an appointment.     Return in about 6 months (around 02/28/2022) for follow up.

## 2021-08-29 ENCOUNTER — Ambulatory Visit (INDEPENDENT_AMBULATORY_CARE_PROVIDER_SITE_OTHER): Payer: 59 | Admitting: Internal Medicine

## 2021-08-29 VITALS — BP 132/76 | HR 62 | Temp 98.1°F | Ht 68.0 in | Wt 215.0 lb

## 2021-08-29 DIAGNOSIS — M79672 Pain in left foot: Secondary | ICD-10-CM

## 2021-08-29 DIAGNOSIS — I10 Essential (primary) hypertension: Secondary | ICD-10-CM

## 2021-08-29 DIAGNOSIS — Z125 Encounter for screening for malignant neoplasm of prostate: Secondary | ICD-10-CM

## 2021-08-29 DIAGNOSIS — E1169 Type 2 diabetes mellitus with other specified complication: Secondary | ICD-10-CM | POA: Diagnosis not present

## 2021-08-29 DIAGNOSIS — M79671 Pain in right foot: Secondary | ICD-10-CM

## 2021-08-29 DIAGNOSIS — E669 Obesity, unspecified: Secondary | ICD-10-CM

## 2021-08-29 DIAGNOSIS — K219 Gastro-esophageal reflux disease without esophagitis: Secondary | ICD-10-CM

## 2021-08-29 DIAGNOSIS — E0590 Thyrotoxicosis, unspecified without thyrotoxic crisis or storm: Secondary | ICD-10-CM

## 2021-08-29 DIAGNOSIS — K222 Esophageal obstruction: Secondary | ICD-10-CM

## 2021-08-29 DIAGNOSIS — E782 Mixed hyperlipidemia: Secondary | ICD-10-CM | POA: Diagnosis not present

## 2021-08-29 LAB — COMPREHENSIVE METABOLIC PANEL
ALT: 41 U/L (ref 0–53)
AST: 27 U/L (ref 0–37)
Albumin: 4.2 g/dL (ref 3.5–5.2)
Alkaline Phosphatase: 61 U/L (ref 39–117)
BUN: 16 mg/dL (ref 6–23)
CO2: 28 mEq/L (ref 19–32)
Calcium: 9.4 mg/dL (ref 8.4–10.5)
Chloride: 99 mEq/L (ref 96–112)
Creatinine, Ser: 0.98 mg/dL (ref 0.40–1.50)
GFR: 81.35 mL/min (ref >60.00)
Glucose, Bld: 154 mg/dL — ABNORMAL HIGH (ref 70–99)
Potassium: 4.1 mEq/L (ref 3.5–5.1)
Sodium: 135 mEq/L (ref 135–145)
Total Bilirubin: 0.5 mg/dL (ref 0.2–1.2)
Total Protein: 6.8 g/dL (ref 6.0–8.3)

## 2021-08-29 LAB — HEMOGLOBIN A1C: Hgb A1c MFr Bld: 6.3 % (ref 4.6–6.5)

## 2021-08-29 LAB — TSH: TSH: 0.29 u[IU]/mL — ABNORMAL LOW (ref 0.35–5.50)

## 2021-08-29 LAB — MICROALBUMIN / CREATININE URINE RATIO
Creatinine,U: 117.6 mg/dL
Microalb Creat Ratio: 0.6 mg/g (ref 0.0–30.0)
Microalb, Ur: <0.7 mg/dL (ref 0.0–1.9)

## 2021-08-29 LAB — PSA, MEDICARE: PSA: 0 ng/ml — ABNORMAL LOW (ref 0.10–4.00)

## 2021-08-29 LAB — LIPID PANEL
Cholesterol: 168 mg/dL (ref 0–200)
HDL: 75.6 mg/dL (ref >39.00)
LDL Cholesterol: 58 mg/dL (ref 0–99)
NonHDL: 92.86
Total CHOL/HDL Ratio: 2
Triglycerides: 174 mg/dL — ABNORMAL HIGH (ref 0.0–149.0)
VLDL: 34.8 mg/dL (ref 0.0–40.0)

## 2021-08-29 NOTE — Assessment & Plan Note (Signed)
Chronic Did see endocrine and given stability no treatment was necessary Check TSH

## 2021-08-29 NOTE — Assessment & Plan Note (Signed)
Chronic  Lab Results  Component Value Date   HGBA1C 5.9 02/27/2021   Sugars very well controlled Check A1c, urine microalbumin today Continue lifestyle control Stressed regular exercise, diabetic diet

## 2021-08-29 NOTE — Assessment & Plan Note (Signed)
Chronic Blood pressure well controlled CMP Continue HCTZ 12.5 mg daily, ramipril 2.5 mg daily, verapamil 240 mg daily

## 2021-08-29 NOTE — Assessment & Plan Note (Signed)
Chronic Regular exercise and healthy diet encouraged Check lipid panel  Continue Crestor 40 mg daily 

## 2021-08-29 NOTE — Assessment & Plan Note (Signed)
Chronic GERD controlled Continue Nexium 40 mg daily 

## 2021-10-10 ENCOUNTER — Other Ambulatory Visit: Payer: Self-pay

## 2021-10-10 DIAGNOSIS — I6521 Occlusion and stenosis of right carotid artery: Secondary | ICD-10-CM

## 2021-10-23 ENCOUNTER — Inpatient Hospital Stay (HOSPITAL_COMMUNITY): Admission: RE | Admit: 2021-10-23 | Payer: 59 | Source: Ambulatory Visit

## 2021-11-21 ENCOUNTER — Other Ambulatory Visit: Payer: Self-pay | Admitting: Internal Medicine

## 2021-12-03 ENCOUNTER — Ambulatory Visit (INDEPENDENT_AMBULATORY_CARE_PROVIDER_SITE_OTHER): Payer: Medicare HMO

## 2021-12-03 ENCOUNTER — Ambulatory Visit: Payer: Medicare HMO | Admitting: Podiatry

## 2021-12-03 DIAGNOSIS — M722 Plantar fascial fibromatosis: Secondary | ICD-10-CM

## 2021-12-03 NOTE — Progress Notes (Signed)
Chief Complaint  Patient presents with   Foot Pain    Bilateral foot pain, the patient is diabetic.    HPI: 66 y.o. male presenting today as a new patient for evaluation of mild to moderate discomfort intermittently to the bilateral feet.  Patient states that the bilateral forefoot feel as if something is under the ball of the foot.  He says it is not necessarily painful but causes some slight irritation/discomfort.  He says that this has been ongoing for several years.  He presents for further treatment and evaluation  Past Medical History:  Diagnosis Date   Barrett's esophagus - RESOLVED 2008   RESOLVED ON SUBSEQUENT EXAMS   Carotid artery dissection ([place]) 2011   L   Diverticulosis    GERD (gastroesophageal reflux disease)    Goiter    Hemorrhoids    Horner's syndrome 2012   Hyperlipidemia    Hypertension    Personal history of colonic adenomas 01/14/2008   Prostate cancer ([place]) 2010   Stroke ([place]) 1996   FROM DISSECTING R CAROID ARTERY     Past Surgical History:  Procedure Laterality Date   BIOPSY THYROID  2007   colloid  admixed with scant follicular epithelium   CAROTID ARTERY ANGIOPLASTY  2011   L   COLONOSCOPY     COLONOSCOPY W/ BIOPSIES AND POLYPECTOMY  multiple   adenomatous polyps, internal hemorrhoids, diverticulosis   CORNEAL TRANSPLANT Left 2015    corneal pannus & perforation,[facility]   DISSECTION OF CAROID ARTERY     WITH CVA   KNEE SURGERY     RIGHT..TO STRIGHTEN PATELLA   ROBOTIC PROSTATECTOMY  2011   Dr Dutch Gray   UPPER GASTROINTESTINAL ENDOSCOPY  multiple   w/dilation, Barrett's , esophageal ring, hiatal hernia, gastritis    Allergies  Allergen Reactions   Amoxicillin     REACTION: hives   Codeine     REACTION: rash   Sulfonamide Derivatives     REACTION: angioedema   Erythromycin     REACTION: ? reaction   Vitamin B50 Complex [Balanced B-100]     Itching, burning in hands     Physical Exam: General: The patient is alert and  oriented x3 in no acute distress.  Dermatology: Skin is warm, dry and supple bilateral lower extremities. Negative for open lesions or macerations.  Vascular: Palpable pedal pulses bilaterally. Capillary refill within normal limits.  Negative for any significant edema or erythema  Neurological: Light touch and protective threshold grossly intact  Musculoskeletal Exam: No pedal deformities noted.  There is some very mild tenderness with palpation along the ball of the foot.  Unable to elicit any Morton's neuroma type symptoms.  Range of motion and palpation to the MTP joints within normal limits  Radiographic Exam:  Normal osseous mineralization. Joint spaces preserved. No fracture/dislocation/boney destruction.    Assessment: 1.  Bilateral forefoot neuritis   Plan of Care:  1. Patient evaluated. X-Rays reviewed.  2.  Recommend OTC arch supports available at any sporting goods store or online 3.  Fortunately the patient does not have severe pain or tenderness associated to the foot.  We will recommend conservative treatment mainly including the arch supports which should help alleviate pressure from the forefoot. 4.  Advised against going barefoot which the patient does not do  5.  Return to clinic as needed      Edrick Kins, DPM [facility]  Dr. Edrick Kins, DPM    [address], [place] [address]                Office [phone]  Fax [phone]

## 2021-12-10 ENCOUNTER — Ambulatory Visit (HOSPITAL_COMMUNITY)
Admission: RE | Admit: 2021-12-10 | Discharge: 2021-12-10 | Disposition: A | Discharge status: Home / Self Care | Payer: Medicare HMO | Source: Ambulatory Visit | Attending: Cardiology | Admitting: Cardiology

## 2021-12-10 DIAGNOSIS — I6521 Occlusion and stenosis of right carotid artery: Secondary | ICD-10-CM | POA: Insufficient documentation

## 2021-12-12 DIAGNOSIS — H2513 Age-related nuclear cataract, bilateral: Secondary | ICD-10-CM | POA: Diagnosis not present

## 2021-12-12 DIAGNOSIS — Z947 Corneal transplant status: Secondary | ICD-10-CM | POA: Diagnosis not present

## 2022-02-15 ENCOUNTER — Other Ambulatory Visit: Payer: Self-pay | Admitting: Internal Medicine

## 2022-02-24 ENCOUNTER — Encounter: Payer: Self-pay | Admitting: Internal Medicine

## 2022-02-24 NOTE — Patient Instructions (Addendum)
     Prevnar pneumonia  vaccine today.      Blood work was ordered.   The lab is on the first floor.     Medications changes include :   None      Return in about 6 months (around 08/30/2022) for Physical Exam.

## 2022-02-24 NOTE — Progress Notes (Unsigned)
Subjective:    Patient ID: Eric Dillon, male    DOB: [birth year], 66 y.o.   MRN: [id number]     HPI Eric Dillon is here for follow up of his chronic medical problems, including htn, fibromuscular dysplasia, DM, hld, gerd, h/o subclinical hyperthyroidism  Overall doing well -no concerns.   Medications and allergies reviewed with patient and updated if appropriate.  Current Outpatient Medications on File Prior to Visit  Medication Sig Dispense Refill   aspirin EC 81 MG tablet Take 81 mg by mouth daily.     esomeprazole (NEXIUM) 40 MG capsule Take 1 capsule (40 mg total) by mouth daily before breakfast. (Patient taking differently: Take 20 mg by mouth daily. Take 2 tablets daily) 90 capsule 3   hydrochlorothiazide (MICROZIDE) 12.5 MG capsule TAKE 1 CAPSULE BY MOUTH EVERY DAY 90 capsule 1   prednisoLONE acetate (PRED FORTE) 1 % ophthalmic suspension Place 1 drop into the left eye daily.     triamcinolone cream (KENALOG) 0.1 % Apply 1 application topically 2 (two) times daily. 30 g 0   valACYclovir (VALTREX) 1000 MG tablet Take 1,000 mg by mouth daily.     verapamil (CALAN-SR) 240 MG CR tablet TAKE 1 TABLET DAILY 90 tablet 3   No current facility-administered medications on file prior to visit.     Review of Systems  Constitutional:  Negative for fever.  Respiratory:  Negative for cough, shortness of breath and wheezing.   Cardiovascular:  Negative for chest pain, palpitations and leg swelling.  Neurological:  Negative for light-headedness and headaches.       Objective:   Vitals:   02/28/22 1056  BP: 134/82  Pulse: 80  Temp: 98.2 F (36.8 C)  SpO2: 98%   BP Readings from Last 3 Encounters:  02/28/22 134/82  08/29/21 132/76  02/27/21 130/80   Wt Readings from Last 3 Encounters:  02/28/22 222 lb (100.7 kg)  08/29/21 215 lb (97.5 kg)  02/27/21 206 lb (93.4 kg)   Body mass index is 33.75 kg/m.    Physical Exam Constitutional:      General: He is not in  acute distress.    Appearance: Normal appearance. He is not ill-appearing.  HENT:     Head: Normocephalic and atraumatic.  Eyes:     Conjunctiva/sclera: Conjunctivae normal.  Cardiovascular:     Rate and Rhythm: Normal rate and regular rhythm.     Heart sounds: Normal heart sounds. No murmur heard. Pulmonary:     Effort: Pulmonary effort is normal. No respiratory distress.     Breath sounds: Normal breath sounds. No wheezing or rales.  Musculoskeletal:     Right lower leg: No edema.     Left lower leg: No edema.  Skin:    General: Skin is warm and dry.     Findings: No rash.  Neurological:     Mental Status: He is alert. Mental status is at baseline.  Psychiatric:        Mood and Affect: Mood normal.        Lab Results  Component Value Date   WBC 7.9 02/28/2020   HGB 14.8 02/28/2020   HCT 43.2 02/28/2020   PLT 259.0 02/28/2020   GLUCOSE 154 (H) 08/29/2021   CHOL 168 08/29/2021   TRIG 174.0 (H) 08/29/2021   HDL 75.60 08/29/2021   LDLDIRECT 84.0 02/28/2020   LDLCALC 58 08/29/2021   ALT 41 08/29/2021   AST 27 08/29/2021   NA 135 08/29/2021  K 4.1 08/29/2021   CL 99 08/29/2021   CREATININE 0.98 08/29/2021   BUN 16 08/29/2021   CO2 28 08/29/2021   TSH 0.29 (L) 08/29/2021   PSA 0.00 Repeated and verified X2. (L) 08/29/2021   INR 0.93 01/22/2010   HGBA1C 6.3 08/29/2021   MICROALBUR <0.7 08/29/2021     Assessment & Plan:   Prevnar 20 given today  See Problem List for Assessment and Plan of chronic medical problems.

## 2022-02-28 ENCOUNTER — Ambulatory Visit (INDEPENDENT_AMBULATORY_CARE_PROVIDER_SITE_OTHER): Payer: Medicare HMO | Admitting: Internal Medicine

## 2022-02-28 VITALS — BP 134/82 | HR 80 | Temp 98.2°F | Ht 68.0 in | Wt 222.0 lb

## 2022-02-28 DIAGNOSIS — K219 Gastro-esophageal reflux disease without esophagitis: Secondary | ICD-10-CM | POA: Diagnosis not present

## 2022-02-28 DIAGNOSIS — Z23 Encounter for immunization: Secondary | ICD-10-CM | POA: Diagnosis not present

## 2022-02-28 DIAGNOSIS — E782 Mixed hyperlipidemia: Secondary | ICD-10-CM

## 2022-02-28 DIAGNOSIS — E0590 Thyrotoxicosis, unspecified without thyrotoxic crisis or storm: Secondary | ICD-10-CM | POA: Diagnosis not present

## 2022-02-28 DIAGNOSIS — I773 Arterial fibromuscular dysplasia: Secondary | ICD-10-CM

## 2022-02-28 DIAGNOSIS — E1169 Type 2 diabetes mellitus with other specified complication: Secondary | ICD-10-CM | POA: Diagnosis not present

## 2022-02-28 DIAGNOSIS — I10 Essential (primary) hypertension: Secondary | ICD-10-CM

## 2022-02-28 DIAGNOSIS — K222 Esophageal obstruction: Secondary | ICD-10-CM

## 2022-02-28 LAB — COMPREHENSIVE METABOLIC PANEL
ALT: 40 U/L (ref 0–53)
AST: 31 U/L (ref 0–37)
Albumin: 4.3 g/dL (ref 3.5–5.2)
Alkaline Phosphatase: 56 U/L (ref 39–117)
BUN: 14 mg/dL (ref 6–23)
CO2: 29 mEq/L (ref 19–32)
Calcium: 9.4 mg/dL (ref 8.4–10.5)
Chloride: 103 mEq/L (ref 96–112)
Creatinine, Ser: 0.94 mg/dL (ref 0.40–1.50)
GFR: 85.22 mL/min (ref >60.00)
Glucose, Bld: 113 mg/dL — ABNORMAL HIGH (ref 70–99)
Potassium: 4.5 mEq/L (ref 3.5–5.1)
Sodium: 139 mEq/L (ref 135–145)
Total Bilirubin: 0.4 mg/dL (ref 0.2–1.2)
Total Protein: 7.2 g/dL (ref 6.0–8.3)

## 2022-02-28 LAB — HEMOGLOBIN A1C: Hgb A1c MFr Bld: 6.4 % (ref 4.6–6.5)

## 2022-02-28 LAB — TSH: TSH: 0.29 u[IU]/mL — ABNORMAL LOW (ref 0.35–5.50)

## 2022-02-28 LAB — LIPID PANEL
Cholesterol: 157 mg/dL (ref 0–200)
HDL: 74.2 mg/dL (ref >39.00)
LDL Cholesterol: 57 mg/dL (ref 0–99)
NonHDL: 82.44
Total CHOL/HDL Ratio: 2
Triglycerides: 128 mg/dL (ref 0.0–149.0)
VLDL: 25.6 mg/dL (ref 0.0–40.0)

## 2022-02-28 MED ORDER — RAMIPRIL 2.5 MG PO CAPS: 2.5000 mg | ORAL_CAPSULE | Freq: Every day | ORAL | 3 refills | Status: DC

## 2022-02-28 MED ORDER — ROSUVASTATIN CALCIUM 40 MG PO TABS: 40.0000 mg | ORAL_TABLET | Freq: Every day | ORAL | 3 refills | Status: DC

## 2022-02-28 NOTE — Assessment & Plan Note (Signed)
Chronic GERD controlled Continue Nexium 40 mg daily

## 2022-02-28 NOTE — Assessment & Plan Note (Signed)
Chronic Blood pressure well controlled CMP Continue hydrochlorothiazide 12.5 mg daily, ramipril 2.5 mg daily, verapamil to 40 mg daily

## 2022-02-28 NOTE — Assessment & Plan Note (Signed)
Chronic Right carotid artery-history of right carotid artery dissection Follows with cardiology

## 2022-02-28 NOTE — Assessment & Plan Note (Signed)
Chronic Regular exercise and healthy diet encouraged Check lipid panel  Continue Crestor 40 mg daily 

## 2022-02-28 NOTE — Assessment & Plan Note (Signed)
Chronic Has seen endocrine in the past and given stability no treatment was necessary I will continue to monitor TSH

## 2022-02-28 NOTE — Assessment & Plan Note (Addendum)
Chronic   Lab Results  Component Value Date   HGBA1C 6.3 08/29/2021   Sugars well controlled Check A1c  Continue lifestyle control-admits he is eating as well as he should and not exercising as much as he should be Stressed regular exercise, diabetic diet

## 2022-03-01 DIAGNOSIS — K222 Esophageal obstruction: Secondary | ICD-10-CM | POA: Diagnosis not present

## 2022-03-01 DIAGNOSIS — E782 Mixed hyperlipidemia: Secondary | ICD-10-CM | POA: Diagnosis not present

## 2022-03-01 DIAGNOSIS — K219 Gastro-esophageal reflux disease without esophagitis: Secondary | ICD-10-CM | POA: Diagnosis not present

## 2022-03-01 DIAGNOSIS — E0590 Thyrotoxicosis, unspecified without thyrotoxic crisis or storm: Secondary | ICD-10-CM | POA: Diagnosis not present

## 2022-03-01 DIAGNOSIS — I773 Arterial fibromuscular dysplasia: Secondary | ICD-10-CM | POA: Diagnosis not present

## 2022-03-01 DIAGNOSIS — E1169 Type 2 diabetes mellitus with other specified complication: Secondary | ICD-10-CM | POA: Diagnosis not present

## 2022-03-01 DIAGNOSIS — I10 Essential (primary) hypertension: Secondary | ICD-10-CM | POA: Diagnosis not present

## 2022-03-01 DIAGNOSIS — Z23 Encounter for immunization: Secondary | ICD-10-CM | POA: Diagnosis not present

## 2022-03-01 NOTE — Addendum Note (Signed)
Addended by: Marcina Millard on: 03/01/2022 09:18 AM   Modules accepted: Orders

## 2022-03-02 ENCOUNTER — Encounter: Payer: Self-pay | Admitting: Internal Medicine

## 2022-04-22 DIAGNOSIS — L578 Other skin changes due to chronic exposure to nonionizing radiation: Secondary | ICD-10-CM | POA: Diagnosis not present

## 2022-04-22 DIAGNOSIS — L821 Other seborrheic keratosis: Secondary | ICD-10-CM | POA: Diagnosis not present

## 2022-04-22 DIAGNOSIS — L570 Actinic keratosis: Secondary | ICD-10-CM | POA: Diagnosis not present

## 2022-05-16 ENCOUNTER — Encounter: Payer: Self-pay | Admitting: Internal Medicine

## 2022-05-16 NOTE — Progress Notes (Unsigned)
Subjective:    Patient ID: Eric Dillon, male    DOB: 1955/06/09, 67 y.o.   MRN: [id number]      HPI Toua is here for  Chief Complaint  Patient presents with   Back Pain    Right lower back pain dual constant pain sometimes feels sharp started 6 to 8 weeks ago      Back pain - started hurting 6-8 weeks ago.  He had a cold at that time - that may have started it.  It is in the right mid back w/o radiation.  The pain is dull and with certain movements there is sharp pain that is transient.  No N/T.  No mm spasms.  No prior episodes.   Has tried rest, stretching hot, cold.  Has tried advil.  Nothing has really helped much - temporarily relief if anything.     Medications and allergies reviewed with patient and updated if appropriate.  Current Outpatient Medications on File Prior to Visit  Medication Sig Dispense Refill   aspirin EC 81 MG tablet Take 81 mg by mouth daily.     esomeprazole (NEXIUM) 40 MG capsule Take 1 capsule (40 mg total) by mouth daily before breakfast. (Patient taking differently: Take 20 mg by mouth daily. Take 2 tablets daily) 90 capsule 3   hydrochlorothiazide (MICROZIDE) 12.5 MG capsule TAKE 1 CAPSULE BY MOUTH EVERY DAY 90 capsule 1   prednisoLONE acetate (PRED FORTE) 1 % ophthalmic suspension Place 1 drop into the left eye daily.     ramipril (ALTACE) 2.5 MG capsule Take 1 capsule (2.5 mg total) by mouth daily. 90 capsule 3   rosuvastatin (CRESTOR) 40 MG tablet Take 1 tablet (40 mg total) by mouth daily. 90 tablet 3   triamcinolone cream (KENALOG) 0.1 % Apply 1 application topically 2 (two) times daily. 30 g 0   valACYclovir (VALTREX) 1000 MG tablet Take 1,000 mg by mouth daily.     verapamil (CALAN-SR) 240 MG CR tablet TAKE 1 TABLET DAILY 90 tablet 3   No current facility-administered medications on file prior to visit.    Review of Systems  Constitutional:  Negative for fever.  Respiratory:  Negative for cough, shortness of breath and  wheezing.   Cardiovascular:  Negative for chest pain.  Gastrointestinal:  Negative for abdominal pain, constipation and diarrhea.  Genitourinary:  Negative for dysuria, frequency and hematuria.  Musculoskeletal:  Positive for back pain.  Neurological:  Negative for numbness.       Objective:   Vitals:   05/17/22 1343  BP: 122/80  Pulse: 73  Temp: 98.8 F (37.1 C)  SpO2: 97%   BP Readings from Last 3 Encounters:  05/17/22 122/80  02/28/22 134/82  08/29/21 132/76   Wt Readings from Last 3 Encounters:  05/17/22 223 lb (101.2 kg)  02/28/22 222 lb (100.7 kg)  08/29/21 215 lb (97.5 kg)   Body mass index is 33.91 kg/m.    Physical Exam Constitutional:      General: He is not in acute distress.    Appearance: Normal appearance. He is not ill-appearing.  HENT:     Head: Normocephalic and atraumatic.  Cardiovascular:     Rate and Rhythm: Normal rate and regular rhythm.  Pulmonary:     Effort: Pulmonary effort is normal. No respiratory distress.     Breath sounds: Normal breath sounds. No wheezing or rales.  Musculoskeletal:        General: No swelling, tenderness (no tenderness w  palpation of T spine or right posterior ribs/lateral ribs) or deformity.     Right lower leg: No edema.     Left lower leg: No edema.     Comments: No pain with palpation across lower back  Neurological:     Mental Status: He is alert.            Assessment & Plan:    See Problem List for Assessment and Plan of chronic medical problems.

## 2022-05-17 ENCOUNTER — Ambulatory Visit (INDEPENDENT_AMBULATORY_CARE_PROVIDER_SITE_OTHER): Payer: Medicare HMO

## 2022-05-17 ENCOUNTER — Encounter: Payer: Self-pay | Admitting: Internal Medicine

## 2022-05-17 ENCOUNTER — Ambulatory Visit (INDEPENDENT_AMBULATORY_CARE_PROVIDER_SITE_OTHER): Payer: Medicare HMO | Admitting: Internal Medicine

## 2022-05-17 VITALS — BP 122/80 | HR 73 | Temp 98.8°F | Ht 68.0 in | Wt 223.0 lb

## 2022-05-17 DIAGNOSIS — M549 Dorsalgia, unspecified: Secondary | ICD-10-CM

## 2022-05-17 DIAGNOSIS — R079 Chest pain, unspecified: Secondary | ICD-10-CM | POA: Diagnosis not present

## 2022-05-17 DIAGNOSIS — M546 Pain in thoracic spine: Secondary | ICD-10-CM | POA: Diagnosis not present

## 2022-05-17 MED ORDER — PREDNISONE 20 MG PO TABS: 40.0000 mg | ORAL_TABLET | Freq: Every day | ORAL | 0 refills | Status: AC

## 2022-05-17 NOTE — Patient Instructions (Addendum)
      Have xrays today.     Medications changes include :   prednisone 40 mg daily x 5 days    A referral was ordered for sports medicine.     Someone will call you to schedule an appointment.     Return if symptoms worsen or fail to improve.

## 2022-05-18 DIAGNOSIS — M549 Dorsalgia, unspecified: Secondary | ICD-10-CM | POA: Insufficient documentation

## 2022-05-18 NOTE — Assessment & Plan Note (Signed)
Subacute Started 6-8 weeks Right middle back - nontender on exam Unknown cause Will get xrays of ribs on right/cxr and t-spine since this has been going on for 6-8 weeks Prednisone 40 mg daily x 5 days Deferred sports med referral at this time - but can refer if no improvement

## 2022-05-21 ENCOUNTER — Ambulatory Visit: Payer: Medicare HMO | Admitting: Internal Medicine

## 2022-05-22 ENCOUNTER — Ambulatory Visit: Payer: Medicare HMO | Admitting: Sports Medicine

## 2022-06-07 ENCOUNTER — Telehealth: Payer: Self-pay | Admitting: Cardiovascular Disease

## 2022-06-07 NOTE — Telephone Encounter (Signed)
06/07/22 LVM to r/s appt with Dr. Audie Box, please offer [facility] office with Dr. Audie Box on 03/29 - LCN

## 2022-06-10 ENCOUNTER — Ambulatory Visit: Payer: Medicare HMO | Admitting: Cardiovascular Disease

## 2022-06-17 NOTE — Progress Notes (Unsigned)
Cardiology Office Note:   Date:  06/19/2022  NAME:  Eric Dillon    MRN: [id number] DOB:  Apr 09, 1955   PCP:  Binnie Rail, MD  Cardiologist:  None  Electrophysiologist:  None   Referring MD: Binnie Rail, MD   Chief Complaint  Patient presents with   Follow-up   History of Present Illness:   Eric Dillon is a 67 y.o. male with a hx of coronary calcium, FMD, HLD who presents for follow-up.  He reports he is doing well.  Denies any chest pains or trouble breathing.  Exercising 1.5 miles daily.  No angina with this.  No shortness of breath.  LDL cholesterol is at goal.  Overall doing quite well.  We discussed the results of his carotid ultrasounds.  Minimal disease.  Likely can be followed every 2 years.  His diabetes number is at goal.  A1c 6.4.  Overall doing quite well without any complaints today in office.  Problem List 1. R ICA dissection (FMD)/CVA -1997 -2011 2. HLD -T chol 157, HDL 74, LDL 57, TG 128 3. DM -A1c 6.4 4. Former Tobacco abuse 5. Carotid Artery disease -R ICA 1-39% 11/2021 6. CAD -Calcium score 253 (76th percentile) -normal MPI 04/13/2020  Past Medical History: Past Medical History:  Diagnosis Date   Barrett's esophagus - RESOLVED 2008   RESOLVED ON SUBSEQUENT EXAMS   Carotid artery dissection ([place]) 2011   L   Diverticulosis    GERD (gastroesophageal reflux disease)    Goiter    Hemorrhoids    Horner's syndrome 2012   Hyperlipidemia    Hypertension    Personal history of colonic adenomas 01/14/2008   Prostate cancer ([place]) 2010   Stroke ([place]) 1996   FROM DISSECTING R CAROID ARTERY     Past Surgical History: Past Surgical History:  Procedure Laterality Date   BIOPSY THYROID  2007   colloid  admixed with scant follicular epithelium   CAROTID ARTERY ANGIOPLASTY  2011   L   COLONOSCOPY     COLONOSCOPY W/ BIOPSIES AND POLYPECTOMY  multiple   adenomatous polyps, internal hemorrhoids, diverticulosis   CORNEAL TRANSPLANT Left 2015     corneal pannus & perforation,[facility]   DISSECTION OF CAROID ARTERY     WITH CVA   KNEE SURGERY     RIGHT..TO STRIGHTEN PATELLA   ROBOTIC PROSTATECTOMY  2011   Dr Dutch Gray   UPPER GASTROINTESTINAL ENDOSCOPY  multiple   w/dilation, Barrett's , esophageal ring, hiatal hernia, gastritis    Current Medications: Current Meds  Medication Sig   aspirin EC 81 MG tablet Take 81 mg by mouth daily.   esomeprazole (NEXIUM) 40 MG capsule Take 1 capsule (40 mg total) by mouth daily before breakfast. (Patient taking differently: Take 20 mg by mouth daily. Take 2 tablets daily)   hydrochlorothiazide (MICROZIDE) 12.5 MG capsule TAKE 1 CAPSULE BY MOUTH EVERY DAY   prednisoLONE acetate (PRED FORTE) 1 % ophthalmic suspension Place 1 drop into the left eye daily.   ramipril (ALTACE) 2.5 MG capsule Take 1 capsule (2.5 mg total) by mouth daily.   rosuvastatin (CRESTOR) 40 MG tablet Take 1 tablet (40 mg total) by mouth daily.   triamcinolone cream (KENALOG) 0.1 % Apply 1 application topically 2 (two) times daily.   valACYclovir (VALTREX) 1000 MG tablet Take 1,000 mg by mouth daily.   verapamil (CALAN-SR) 240 MG CR tablet TAKE 1 TABLET DAILY     Allergies:    Amoxicillin, Codeine, Sulfonamide derivatives,  Erythromycin, and Vitamin b50 complex [balanced b-100]   Social History: Social History   Socioeconomic History   Marital status: Married    Spouse name: Not on file   Number of children: 2   Years of education: Not on file   Highest education level: Not on file  Occupational History   Occupation: Analyst  Tobacco Use   Smoking status: Former    Types: E-cigarettes    Quit date: 01/23/2010    Years since quitting: 12.4   Smokeless tobacco: Never   Tobacco comments:    smoked 1974-2011, up to 1 ppd  Substance and Sexual Activity   Alcohol use: Yes    Alcohol/week: 21.0 - 28.0 standard drinks of alcohol    Types: 21 - 28 Cans of beer per week   Drug use: No   Sexual activity: Not on file   Other Topics Concern   Not on file  Social History Narrative   Gets reg exercise   Social Determinants of Health   Financial Resource Strain: Not on file  Food Insecurity: Not on file  Transportation Needs: Not on file  Physical Activity: Not on file  Stress: Not on file  Social Connections: Not on file     Family History: The patient's family history includes CAD in his father; Diabetes in his mother; Stroke (age of onset: 6) in his father; Stroke (age of onset: 20) in his paternal grandmother. There is no history of Cancer, Colon cancer, Esophageal cancer, Rectal cancer, or Stomach cancer.  ROS:   All other ROS reviewed and negative. Pertinent positives noted in the HPI.     EKGs/Labs/Other Studies Reviewed:   The following studies were personally reviewed by me today:  Recent Labs: 02/28/2022: ALT 40; BUN 14; Creatinine, Ser 0.94; Potassium 4.5; Sodium 139; TSH 0.29   Recent Lipid Panel    Component Value Date/Time   CHOL 157 02/28/2022 1131   CHOL 191 06/23/2013 0926   TRIG 128.0 02/28/2022 1131   TRIG 123 06/23/2013 0926   HDL 74.20 02/28/2022 1131   HDL 71 06/23/2013 0926   CHOLHDL 2 02/28/2022 1131   VLDL 25.6 02/28/2022 1131   LDLCALC 57 02/28/2022 1131   LDLCALC 95 06/23/2013 0926   LDLDIRECT 84.0 02/28/2020 1348    Physical Exam:   VS:  BP (!) 140/72   Pulse 67   Ht 5\' 8"  (1.727 m)   Wt 222 lb 12.8 oz (101.1 kg)   SpO2 97%   BMI 33.88 kg/m    Wt Readings from Last 3 Encounters:  06/19/22 222 lb 12.8 oz (101.1 kg)  05/17/22 223 lb (101.2 kg)  02/28/22 222 lb (100.7 kg)    General: Well nourished, well developed, in no acute distress Head: Atraumatic, normal size  Eyes: PEERLA, EOMI  Neck: Supple, no JVD Endocrine: No thryomegaly Cardiac: Normal S1, S2; RRR; no murmurs, rubs, or gallops Lungs: Clear to auscultation bilaterally, no wheezing, rhonchi or rales  Abd: Soft, nontender, no hepatomegaly  Ext: No edema, pulses 2+ Musculoskeletal: No  deformities, BUE and BLE strength normal and equal Skin: Warm and dry, no rashes   Neuro: Alert and oriented to person, place, time, and situation, CNII-XII grossly intact, no focal deficits  Psych: Normal mood and affect   ASSESSMENT:   Eric Dillon is a 67 y.o. male who presents for the following: 1. Stenosis of right carotid artery   2. Coronary artery disease involving native coronary artery of native heart without angina pectoris  3. Agatston coronary artery calcium score between 200 and 399   4. Mixed hyperlipidemia   5. Fibromuscular dysplasia ([place])     PLAN:   1. Stenosis of right carotid artery -Right ICA stenosis.  History of right ICA dissection.  History of FMD as well.  No recurrence.  Overall doing well.  On aspirin and cholesterol-lowering agents.  Lipids are at goal.  We will follow his carotids every 2 years.  Minimal disease noted.  2. Coronary artery disease involving native coronary artery of native heart without angina pectoris 3. Agatston coronary artery calcium score between 200 and 399 4. Mixed hyperlipidemia -Elevated calcium score.  Normal perfusion study in 2022.  Lipids are at goal.  On aspirin.  No symptoms of angina.  BP well-controlled.  5. Fibromuscular dysplasia ([place]) -History of this.  Seems to be stable.  We will continue with surveillance of his carotid arteries every other year.  Disposition: Return in about 1 year (around 06/19/2023).  Medication Adjustments/Labs and Tests Ordered: Current medicines are reviewed at length with the patient today.  Concerns regarding medicines are outlined above.  No orders of the defined types were placed in this encounter.  No orders of the defined types were placed in this encounter.   Patient Instructions  Medication Instructions:  The current medical regimen is effective;  continue present plan and medications.  *If you need a refill on your cardiac medications before your next appointment, please  call your pharmacy*   Follow-Up: At [facility], you and your health needs are our priority.  As part of our continuing mission to provide you with exceptional heart care, we have created designated Provider Care Teams.  These Care Teams include your primary Cardiologist (physician) and Advanced Practice Providers (APPs -  Physician Assistants and Nurse Practitioners) who all work together to provide you with the care you need, when you need it.  We recommend signing up for the patient portal called "MyChart".  Sign up information is provided on this After Visit Summary.  MyChart is used to connect with patients for Virtual Visits (Telemedicine).  Patients are able to view lab/test results, encounter notes, upcoming appointments, etc.  Non-urgent messages can be sent to your provider as well.   To learn more about what you can do with MyChart, go to [email or web address].    Your next appointment:   12 month(s)  Provider:   Almyra Deforest, PA-C, Sande Rives, PA-C, or Diona Browner, NP       Time Spent with Patient: I have spent a total of 25 minutes with patient reviewing hospital notes, telemetry, EKGs, labs and examining the patient as well as establishing an assessment and plan that was discussed with the patient.  > 50% of time was spent in direct patient care.  Signed, Addison Naegeli. Audie Box, MD, [place]  [address] [place], [place] [address] [phone]  06/19/2022 9:49 AM

## 2022-06-19 ENCOUNTER — Ambulatory Visit: Payer: Medicare HMO | Attending: Cardiovascular Disease | Admitting: Cardiovascular Disease

## 2022-06-19 ENCOUNTER — Encounter: Payer: Self-pay | Admitting: Cardiovascular Disease

## 2022-06-19 VITALS — BP 140/72 | HR 67 | Ht 68.0 in | Wt 222.8 lb

## 2022-06-19 DIAGNOSIS — I2510 Atherosclerotic heart disease of native coronary artery without angina pectoris: Secondary | ICD-10-CM | POA: Diagnosis not present

## 2022-06-19 DIAGNOSIS — R931 Abnormal findings on diagnostic imaging of heart and coronary circulation: Secondary | ICD-10-CM | POA: Diagnosis not present

## 2022-06-19 DIAGNOSIS — I6521 Occlusion and stenosis of right carotid artery: Secondary | ICD-10-CM

## 2022-06-19 DIAGNOSIS — I773 Arterial fibromuscular dysplasia: Secondary | ICD-10-CM

## 2022-06-19 DIAGNOSIS — E782 Mixed hyperlipidemia: Secondary | ICD-10-CM

## 2022-06-19 NOTE — Patient Instructions (Signed)
Medication Instructions:  The current medical regimen is effective;  continue present plan and medications.  *If you need a refill on your cardiac medications before your next appointment, please call your pharmacy*   Follow-Up: At [facility], you and your health needs are our priority.  As part of our continuing mission to provide you with exceptional heart care, we have created designated Provider Care Teams.  These Care Teams include your primary Cardiologist (physician) and Advanced Practice Providers (APPs -  Physician Assistants and Nurse Practitioners) who all work together to provide you with the care you need, when you need it.  We recommend signing up for the patient portal called "MyChart".  Sign up information is provided on this After Visit Summary.  MyChart is used to connect with patients for Virtual Visits (Telemedicine).  Patients are able to view lab/test results, encounter notes, upcoming appointments, etc.  Non-urgent messages can be sent to your provider as well.   To learn more about what you can do with MyChart, go to [email or web address].    Your next appointment:   12 month(s)  Provider:   Hao Meng, PA-C, Callie Goodrich, PA-C, or Emily Monge, NP     

## 2022-08-11 ENCOUNTER — Other Ambulatory Visit: Payer: Self-pay | Admitting: Internal Medicine

## 2022-08-30 ENCOUNTER — Ambulatory Visit: Payer: Medicare HMO | Admitting: Internal Medicine

## 2022-09-03 ENCOUNTER — Encounter: Payer: Self-pay | Admitting: Internal Medicine

## 2022-09-03 NOTE — Progress Notes (Unsigned)
Subjective:    Patient ID: Eric Dillon, male    DOB: [birth year], 67 y.o.   MRN: [id number]     HPI Eric Dillon is here for follow up of his chronic medical problems.  Having bilateral knee pain-left knee is worse than right knee.  The pain starts on the lateral aspect of the posterior knee and radiates towards the anterior knee.  He feels it more when standing up, bending over, coming downstairs and a little bit when walking.   Trying to walk, but not as regularly as he should.  Trying to watch his sugar intake..  Medications and allergies reviewed with patient and updated if appropriate.  Current Outpatient Medications on File Prior to Visit  Medication Sig Dispense Refill   aspirin EC 81 MG tablet Take 81 mg by mouth daily.     esomeprazole (NEXIUM) 40 MG capsule Take 1 capsule (40 mg total) by mouth daily before breakfast. (Patient taking differently: Take 20 mg by mouth daily. Take 2 tablets daily) 90 capsule 3   hydrochlorothiazide (MICROZIDE) 12.5 MG capsule TAKE 1 CAPSULE BY MOUTH EVERY DAY 90 capsule 1   prednisoLONE acetate (PRED FORTE) 1 % ophthalmic suspension Place 1 drop into the left eye daily.     ramipril (ALTACE) 2.5 MG capsule Take 1 capsule (2.5 mg total) by mouth daily. 90 capsule 3   rosuvastatin (CRESTOR) 40 MG tablet Take 1 tablet (40 mg total) by mouth daily. 90 tablet 3   triamcinolone cream (KENALOG) 0.1 % Apply 1 application topically 2 (two) times daily. 30 g 0   valACYclovir (VALTREX) 1000 MG tablet Take 1,000 mg by mouth daily.     verapamil (CALAN-SR) 240 MG CR tablet TAKE 1 TABLET DAILY 90 tablet 3   No current facility-administered medications on file prior to visit.     Review of Systems  Constitutional:  Negative for fever.  Respiratory:  Negative for cough, shortness of breath and wheezing.   Cardiovascular:  Negative for chest pain, palpitations and leg swelling.  Neurological:  Negative for light-headedness and headaches.        Objective:   Vitals:   09/04/22 1359  BP: 116/78  Pulse: 67  Temp: 98.5 F (36.9 C)  SpO2: 95%   BP Readings from Last 3 Encounters:  09/04/22 116/78  06/19/22 (!) 140/72  05/17/22 122/80   Wt Readings from Last 3 Encounters:  09/04/22 221 lb (100.2 kg)  06/19/22 222 lb 12.8 oz (101.1 kg)  05/17/22 223 lb (101.2 kg)   Body mass index is 33.6 kg/m.    Physical Exam Constitutional:      General: He is not in acute distress.    Appearance: Normal appearance. He is not ill-appearing.  HENT:     Head: Normocephalic and atraumatic.  Eyes:     Conjunctiva/sclera: Conjunctivae normal.  Cardiovascular:     Rate and Rhythm: Normal rate and regular rhythm.     Heart sounds: Normal heart sounds.  Pulmonary:     Effort: Pulmonary effort is normal. No respiratory distress.     Breath sounds: Normal breath sounds. No wheezing or rales.  Musculoskeletal:     Right lower leg: No edema.     Left lower leg: No edema.  Skin:    General: Skin is warm and dry.     Findings: No rash.  Neurological:     Mental Status: He is alert. Mental status is at baseline.  Psychiatric:  Mood and Affect: Mood normal.        Lab Results  Component Value Date   WBC 7.9 02/28/2020   HGB 14.8 02/28/2020   HCT 43.2 02/28/2020   PLT 259.0 02/28/2020   GLUCOSE 113 (H) 02/28/2022   CHOL 157 02/28/2022   TRIG 128.0 02/28/2022   HDL 74.20 02/28/2022   LDLDIRECT 84.0 02/28/2020   LDLCALC 57 02/28/2022   ALT 40 02/28/2022   AST 31 02/28/2022   NA 139 02/28/2022   K 4.5 02/28/2022   CL 103 02/28/2022   CREATININE 0.94 02/28/2022   BUN 14 02/28/2022   CO2 29 02/28/2022   TSH 0.29 (L) 02/28/2022   PSA 0.00 Repeated and verified X2. (L) 08/29/2021   INR 0.93 01/22/2010   HGBA1C 6.4 02/28/2022   MICROALBUR <0.7 08/29/2021     Assessment & Plan:    See Problem List for Assessment and Plan of chronic medical problems.

## 2022-09-03 NOTE — Patient Instructions (Addendum)
      Blood work was ordered.   The lab is on the first floor.    Medications changes include :       A referral was ordered and someone will call you to schedule an appointment.     Return in about 6 months (around 03/06/2023) for Physical Exam.  

## 2022-09-04 ENCOUNTER — Ambulatory Visit (INDEPENDENT_AMBULATORY_CARE_PROVIDER_SITE_OTHER): Payer: Medicare HMO | Admitting: Internal Medicine

## 2022-09-04 VITALS — BP 116/78 | HR 67 | Temp 98.5°F | Ht 68.0 in | Wt 221.0 lb

## 2022-09-04 DIAGNOSIS — E0590 Thyrotoxicosis, unspecified without thyrotoxic crisis or storm: Secondary | ICD-10-CM | POA: Diagnosis not present

## 2022-09-04 DIAGNOSIS — K219 Gastro-esophageal reflux disease without esophagitis: Secondary | ICD-10-CM

## 2022-09-04 DIAGNOSIS — E1169 Type 2 diabetes mellitus with other specified complication: Secondary | ICD-10-CM

## 2022-09-04 DIAGNOSIS — I10 Essential (primary) hypertension: Secondary | ICD-10-CM | POA: Diagnosis not present

## 2022-09-04 DIAGNOSIS — E782 Mixed hyperlipidemia: Secondary | ICD-10-CM | POA: Diagnosis not present

## 2022-09-04 DIAGNOSIS — M25562 Pain in left knee: Secondary | ICD-10-CM

## 2022-09-04 DIAGNOSIS — I773 Arterial fibromuscular dysplasia: Secondary | ICD-10-CM

## 2022-09-04 DIAGNOSIS — M25561 Pain in right knee: Secondary | ICD-10-CM

## 2022-09-04 DIAGNOSIS — K222 Esophageal obstruction: Secondary | ICD-10-CM

## 2022-09-04 LAB — TSH: TSH: 0.37 u[IU]/mL (ref 0.35–5.50)

## 2022-09-04 LAB — CBC WITH DIFFERENTIAL/PLATELET
Basophils Absolute: 0.1 10*3/uL (ref 0.0–0.1)
Basophils Relative: 0.6 % (ref 0.0–3.0)
Eosinophils Absolute: 0.1 10*3/uL (ref 0.0–0.7)
Eosinophils Relative: 0.8 % (ref 0.0–5.0)
HCT: 44.2 % (ref 39.0–52.0)
Hemoglobin: 14.9 g/dL (ref 13.0–17.0)
Lymphocytes Relative: 12 % (ref 12.0–46.0)
Lymphs Abs: 1.2 10*3/uL (ref 0.7–4.0)
MCHC: 33.7 g/dL (ref 30.0–36.0)
MCV: 103.2 fl — ABNORMAL HIGH (ref 78.0–100.0)
Monocytes Absolute: 0.8 10*3/uL (ref 0.1–1.0)
Monocytes Relative: 7.7 % (ref 3.0–12.0)
Neutro Abs: 8.2 10*3/uL — ABNORMAL HIGH (ref 1.4–7.7)
Neutrophils Relative %: 78.9 % — ABNORMAL HIGH (ref 43.0–77.0)
Platelets: 262 10*3/uL (ref 150.0–400.0)
RBC: 4.28 Mil/uL (ref 4.22–5.81)
RDW: 12.9 % (ref 11.5–15.5)
WBC: 10.3 10*3/uL (ref 4.0–10.5)

## 2022-09-04 LAB — COMPREHENSIVE METABOLIC PANEL
ALT: 42 U/L (ref 0–53)
AST: 34 U/L (ref 0–37)
Albumin: 4 g/dL (ref 3.5–5.2)
Alkaline Phosphatase: 71 U/L (ref 39–117)
BUN: 17 mg/dL (ref 6–23)
CO2: 24 mEq/L (ref 19–32)
Calcium: 9.3 mg/dL (ref 8.4–10.5)
Chloride: 98 mEq/L (ref 96–112)
Creatinine, Ser: 1.07 mg/dL (ref 0.40–1.50)
GFR: 72.69 mL/min (ref >60.00)
Glucose, Bld: 208 mg/dL — ABNORMAL HIGH (ref 70–99)
Potassium: 3.8 mEq/L (ref 3.5–5.1)
Sodium: 133 mEq/L — ABNORMAL LOW (ref 135–145)
Total Bilirubin: 0.6 mg/dL (ref 0.2–1.2)
Total Protein: 7.4 g/dL (ref 6.0–8.3)

## 2022-09-04 LAB — MICROALBUMIN / CREATININE URINE RATIO
Creatinine,U: 136.8 mg/dL
Microalb Creat Ratio: 1.5 mg/g (ref 0.0–30.0)
Microalb, Ur: 2.1 mg/dL — ABNORMAL HIGH (ref 0.0–1.9)

## 2022-09-04 LAB — LIPID PANEL
Cholesterol: 138 mg/dL (ref 0–200)
HDL: 58.3 mg/dL (ref >39.00)
LDL Cholesterol: 42 mg/dL (ref 0–99)
NonHDL: 79.65
Total CHOL/HDL Ratio: 2
Triglycerides: 186 mg/dL — ABNORMAL HIGH (ref 0.0–149.0)
VLDL: 37.2 mg/dL (ref 0.0–40.0)

## 2022-09-04 LAB — T3, FREE: T3, Free: 3.2 pg/mL (ref 2.3–4.2)

## 2022-09-04 LAB — HEMOGLOBIN A1C: Hgb A1c MFr Bld: 6.5 % (ref 4.6–6.5)

## 2022-09-04 LAB — T4, FREE: Free T4: 0.99 ng/dL (ref 0.60–1.60)

## 2022-09-04 NOTE — Assessment & Plan Note (Signed)
Chronic Has seen endocrine in the past and given stability no treatment was necessary I will continue to monitor TSH 

## 2022-09-04 NOTE — Assessment & Plan Note (Signed)
New Bilateral knee pain from posterior lateral aspect of the knee rating around to anterior knee Worse when standing up, going downstairs, a little when walking Will refer to sports medicine

## 2022-09-04 NOTE — Assessment & Plan Note (Signed)
Chronic   Lab Results  Component Value Date   HGBA1C 6.4 02/28/2022   Sugars well controlled Check A1c, urine microalbumin Eye exams up-to-date-will get report Continue lifestyle control-admits he is eating as well as he should and not exercising as much as he should be Stressed regular exercise, diabetic diet

## 2022-09-04 NOTE — Assessment & Plan Note (Signed)
Chronic GERD controlled Continue Nexium 40 mg daily 

## 2022-09-04 NOTE — Assessment & Plan Note (Signed)
Chronic Regular exercise and healthy diet encouraged Check lipid panel  Continue Crestor 40 mg daily 

## 2022-09-04 NOTE — Assessment & Plan Note (Signed)
Chronic Blood pressure well controlled CMP Continue hydrochlorothiazide 12.5 mg daily, ramipril 2.5 mg daily, verapamil to 40 mg daily 

## 2022-09-04 NOTE — Assessment & Plan Note (Signed)
Chronic Right carotid artery-history of right carotid artery dissection Follows with cardiology 

## 2022-09-05 ENCOUNTER — Ambulatory Visit: Payer: Medicare HMO | Admitting: Sports Medicine

## 2022-09-05 ENCOUNTER — Ambulatory Visit (INDEPENDENT_AMBULATORY_CARE_PROVIDER_SITE_OTHER): Payer: Medicare HMO

## 2022-09-05 VITALS — BP 122/80 | HR 80 | Ht 68.0 in | Wt 222.0 lb

## 2022-09-05 DIAGNOSIS — M25562 Pain in left knee: Secondary | ICD-10-CM

## 2022-09-05 DIAGNOSIS — M1711 Unilateral primary osteoarthritis, right knee: Secondary | ICD-10-CM | POA: Diagnosis not present

## 2022-09-05 DIAGNOSIS — M25561 Pain in right knee: Secondary | ICD-10-CM

## 2022-09-05 MED ORDER — MELOXICAM 15 MG PO TABS
15.0000 mg | ORAL_TABLET | Freq: Every day | ORAL | 0 refills | Status: DC
Start: 2022-09-05 — End: 2023-03-10

## 2022-09-05 NOTE — Patient Instructions (Addendum)
Good to see you  - Start meloxicam 15 mg daily x2 weeks.  If still having pain after 2 weeks, complete 3rd-week of meloxicam. May use remaining meloxicam as needed once daily for pain control.  Do not to use additional NSAIDs while taking meloxicam.  May use Tylenol [phone] mg 2 to 3 times a day for breakthrough pain. Exercises for knees given  Follow up in 4 weeks

## 2022-09-05 NOTE — Progress Notes (Signed)
Eric Dillon D.[name] [address] [place] [address] Phone: [phone]   Assessment and Plan:     1. Acute pain of both knees -Acute, uncomplicated, initial sports medicine visit - Acute bilateral knee pain flared from patient's fall a month ago with continued pain with activity and stairs.  No significant finding on x-ray or physical exam.  I suspect patient has intra-articular inflammation due to fall and exacerbation of mild arthritic changes seen in bilateral patellofemoral compartments and right medial compartment on x-ray - X-ray obtained in clinic.  My interpretation: No acute fracture or dislocation.  Medial right knee degenerative changes with history of arthroscopy.  Bilateral patellofemoral compartment degenerative changes with patellar spurring laterally - Start meloxicam 15 mg daily x2 weeks.  If still having pain after 2 weeks, complete 3rd-week of meloxicam. May use remaining meloxicam as needed once daily for pain control.  Do not to use additional NSAIDs while taking meloxicam.  May use Tylenol [phone] mg 2 to 3 times a day for breakthrough pain. - Start HEP for knee.  Handout provided   Pertinent previous records reviewed include none   Follow Up: 4 weeks for reevaluation.  Could consider bilateral intra-articular knee CSI if no improvement or worsening of symptoms   Subjective:   I, Eric Dillon, am serving as a Neurosurgeon for Doctor Richardean Sale  Chief Complaint: bilat knees  HPI:   09/05/22 Patient is a 67 year old male complaining of bilat knee pain. Patient states patient was carrying chairs down the steps and legs got tangled with the legs of the chairs and fell down the last two steps. Patient knees feels very very tight. Patient states that the left knee the pain radiates from the back of the knee laterally to the front. The right knee is located more so to the lateral right knee. Left knee hit the ground and  not sure how the right knee hit because everything was tangle in the chairs. Patient used ice packs for the swelling. Patient states he was fine for a few days and progressively have gotten worse. This happened a week after mothers day. Pain is worse when going up or down steps or trying to bend over. Walking is Just uncomfortable    Relevant Historical Information: GERD, hypertension, DM type II, history of prostate cancer  Additional pertinent review of systems negative.   Current Outpatient Medications:    meloxicam (MOBIC) 15 MG tablet, Take 1 tablet (15 mg total) by mouth daily., Disp: 30 tablet, Rfl: 0   aspirin EC 81 MG tablet, Take 81 mg by mouth daily., Disp: , Rfl:    esomeprazole (NEXIUM) 40 MG capsule, Take 1 capsule (40 mg total) by mouth daily before breakfast. (Patient taking differently: Take 20 mg by mouth daily. Take 2 tablets daily), Disp: 90 capsule, Rfl: 3   hydrochlorothiazide (MICROZIDE) 12.5 MG capsule, TAKE 1 CAPSULE BY MOUTH EVERY DAY, Disp: 90 capsule, Rfl: 1   prednisoLONE acetate (PRED FORTE) 1 % ophthalmic suspension, Place 1 drop into the left eye daily., Disp: , Rfl:    ramipril (ALTACE) 2.5 MG capsule, Take 1 capsule (2.5 mg total) by mouth daily., Disp: 90 capsule, Rfl: 3   rosuvastatin (CRESTOR) 40 MG tablet, Take 1 tablet (40 mg total) by mouth daily., Disp: 90 tablet, Rfl: 3   triamcinolone cream (KENALOG) 0.1 %, Apply 1 application topically 2 (two) times daily., Disp: 30 g, Rfl: 0   valACYclovir (VALTREX) 1000  MG tablet, Take 1,000 mg by mouth daily., Disp: , Rfl:    verapamil (CALAN-SR) 240 MG CR tablet, TAKE 1 TABLET DAILY, Disp: 90 tablet, Rfl: 3   Objective:     Vitals:   09/05/22 1500  BP: 122/80  Pulse: 80  SpO2: 97%  Weight: 222 lb (100.7 kg)  Height: 5\' 8"  (1.727 m)      Body mass index is 33.75 kg/m.    Physical Exam:    General:  awake, alert oriented, no acute distress nontoxic Skin: no suspicious lesions or rashes Neuro:sensation  intact and strength 5/5 with no deficits, no atrophy, normal muscle tone Psych: No signs of anxiety, depression or other mood disorder  Bilateral knee: Mild swelling No deformity Neg fluid wave, joint milking ROM Flex 110, Ext 0 NTTP over the quad tendon, medial fem condyle, lat fem condyle, patella, plica, patella tendon, tibial tuberostiy, fibular head, posterior fossa, pes anserine bursa, gerdy's tubercle, medial jt line, lateral jt line Neg anterior and posterior drawer Neg lachman on the right, equivocal on left with mildly increased laxity, but still had a firm endpoint Neg sag sign Negative varus stress Negative valgus stress Negative McMurray Negative Thess[place]  Gait normal    Electronically signed by:  Eric Dillon D.[name] 3:20 PM 09/05/22

## 2022-09-30 ENCOUNTER — Ambulatory Visit: Payer: Medicare HMO | Admitting: Sports Medicine

## 2022-09-30 VITALS — BP 118/82 | HR 70 | Ht 68.0 in | Wt 223.0 lb

## 2022-09-30 DIAGNOSIS — M170 Bilateral primary osteoarthritis of knee: Secondary | ICD-10-CM | POA: Diagnosis not present

## 2022-09-30 DIAGNOSIS — M25562 Pain in left knee: Secondary | ICD-10-CM

## 2022-09-30 DIAGNOSIS — M25561 Pain in right knee: Secondary | ICD-10-CM

## 2022-09-30 NOTE — Progress Notes (Signed)
[facility] [address] [place] [address] Phone: [phone]   Assessment and Plan:     1. Acute pain of both knees 2. Bilateral primary osteoarthritis of knee  -Chronic with exacerbation, subsequent visit - Continued acute flare likely of bilateral knee osteoarthritis that has overall improved with course of meloxicam, though patient continues to have daily symptoms - Due to continued ongoing daily pain, patient elected for bilateral intra-articular knee CSI.  Tolerated well per note below - Discontinue meloxicam and may use remainder as needed - Recommend using Tylenol as needed for day-to-day pain relief - Continue HEP for knee  Procedure: Knee Joint Injection Side: Bilateral Indication: Flare of osteoarthritis  Risks explained and consent was given verbally. The site was cleaned with alcohol prep. A needle was introduced with an anterio-lateral approach. Injection given using 2mL of 1% lidocaine without epinephrine and 1mL of kenalog 40mg /ml. This was well tolerated and resulted in symptomatic relief.  Needle was removed, hemostasis achieved, and post injection instructions were explained.  Procedure was repeated on contralateral side.  Pt was advised to call or return to clinic if these symptoms worsen or fail to improve as anticipated.   Pertinent previous records reviewed include none   Follow Up: 4 weeks for reevaluation.  If no improvement or worsening of symptoms, could consider alternative injections such as Zilretta versus HA versus PRP.  Could consider physical therapy versus advanced imaging   Subjective:   I, Eric Dillon, am serving as a Neurosurgeon for Doctor Eric Dillon   Chief Complaint: bilat knees   HPI:    09/05/22 Patient is a 67 year old male complaining of bilat knee pain. Patient states patient was carrying chairs down the steps and legs got tangled with the legs of the chairs and fell down  the last two steps. Patient knees feels very very tight. Patient states that the left knee the pain radiates from the back of the knee laterally to the front. The right knee is located more so to the lateral right knee. Left knee hit the ground and not sure how the right knee hit because everything was tangle in the chairs. Patient used ice packs for the swelling. Patient states he was fine for a few days and progressively have gotten worse. This happened a week after mothers day. Pain is worse when going up or down steps or trying to bend over. Walking is Just uncomfortable    09/30/2022 Patient states he is better not a 100 percent but better than when he came in     Relevant Historical Information: GERD, hypertension, DM type II, history of prostate cancer    Additional pertinent review of systems negative.   Current Outpatient Medications:    aspirin EC 81 MG tablet, Take 81 mg by mouth daily., Disp: , Rfl:    esomeprazole (NEXIUM) 40 MG capsule, Take 1 capsule (40 mg total) by mouth daily before breakfast. (Patient taking differently: Take 20 mg by mouth daily. Take 2 tablets daily), Disp: 90 capsule, Rfl: 3   hydrochlorothiazide (MICROZIDE) 12.5 MG capsule, TAKE 1 CAPSULE BY MOUTH EVERY DAY, Disp: 90 capsule, Rfl: 1   meloxicam (MOBIC) 15 MG tablet, Take 1 tablet (15 mg total) by mouth daily., Disp: 30 tablet, Rfl: 0   prednisoLONE acetate (PRED FORTE) 1 % ophthalmic suspension, Place 1 drop into the left eye daily., Disp: , Rfl:    ramipril (ALTACE) 2.5 MG capsule, Take 1 capsule (  2.5 mg total) by mouth daily., Disp: 90 capsule, Rfl: 3   rosuvastatin (CRESTOR) 40 MG tablet, Take 1 tablet (40 mg total) by mouth daily., Disp: 90 tablet, Rfl: 3   triamcinolone cream (KENALOG) 0.1 %, Apply 1 application topically 2 (two) times daily., Disp: 30 g, Rfl: 0   valACYclovir (VALTREX) 1000 MG tablet, Take 1,000 mg by mouth daily., Disp: , Rfl:    verapamil (CALAN-SR) 240 MG CR tablet, TAKE 1 TABLET  DAILY, Disp: 90 tablet, Rfl: 3   Objective:     Vitals:   09/30/22 1449  BP: 118/82  Pulse: 70  SpO2: 97%  Weight: 223 lb (101.2 kg)  Height: 5\' 8"  (1.727 m)      Body mass index is 33.91 kg/m.    Physical Exam:    General:  awake, alert oriented, no acute distress nontoxic Skin: no suspicious lesions or rashes Neuro:sensation intact and strength 5/5 with no deficits, no atrophy, normal muscle tone Psych: No signs of anxiety, depression or other mood disorder   Bilateral knee: Mild swelling No deformity Neg fluid wave, joint milking ROM Flex 110, Ext 0 NTTP over the quad tendon, medial fem condyle, lat fem condyle, patella, plica, patella tendon, tibial tuberostiy, fibular head, posterior fossa, pes anserine bursa, gerdy's tubercle, medial jt line, lateral jt line Neg anterior and posterior drawer Neg lachman on the right, equivocal on left with mildly increased laxity, but still had a firm endpoint Neg sag sign Negative varus stress Negative valgus stress Negative McMurray Negative Thessaly   Gait normal     Electronically signed by:  [facility] 3:29 PM 09/30/22

## 2022-09-30 NOTE — Patient Instructions (Signed)
4 week follow up

## 2022-10-23 NOTE — Progress Notes (Signed)
[name] [address] [place] [address] Phone: [phone]   Assessment and Plan:     1. Chronic pain of both knees 2. Bilateral primary osteoarthritis of knee  -Chronic with exacerbation, subsequent visit - Overall improvement, "80%", after bilateral knee CSI performed at previous office visit on 09/30/2022 with patient still having occasional episode of weakness and giving out - Continue HEP and may use Tylenol as needed for day-to-day pain relief - May continue to use remaining meloxicam as needed for breakthrough pain - Start physical therapy for knee.  Referral sent  Pertinent previous records reviewed include none   Follow Up: 4 to 6 weeks for reevaluation.  If no improvement or worsening of symptoms, could consider alternative injections such as Zilretta versus HA versus advanced imaging   Subjective:   I, Eric Dillon, am serving as a Neurosurgeon for Doctor Eric Dillon   Chief Complaint: bilat knees   HPI:    09/05/22 Patient is a 67 year old male complaining of bilat knee pain. Patient states patient was carrying chairs down the steps and legs got tangled with the legs of the chairs and fell down the last two steps. Patient knees feels very very tight. Patient states that the left knee the pain radiates from the back of the knee laterally to the front. The right knee is located more so to the lateral right knee. Left knee hit the ground and not sure how the right knee hit because everything was tangle in the chairs. Patient used ice packs for the swelling. Patient states he was fine for a few days and progressively have gotten worse. This happened a week after mothers day. Pain is worse when going up or down steps or trying to bend over. Walking is Just uncomfortable     09/30/2022 Patient states he is better not a 100 percent but better than when he came in     10/28/2022 Patient states that he is better , not a  100 % but much better    Relevant Historical Information: GERD, hypertension, DM type II, history of prostate cancer  Additional pertinent review of systems negative.   Current Outpatient Medications:    aspirin EC 81 MG tablet, Take 81 mg by mouth daily., Disp: , Rfl:    esomeprazole (NEXIUM) 40 MG capsule, Take 1 capsule (40 mg total) by mouth daily before breakfast. (Patient taking differently: Take 20 mg by mouth daily. Take 2 tablets daily), Disp: 90 capsule, Rfl: 3   hydrochlorothiazide (MICROZIDE) 12.5 MG capsule, TAKE 1 CAPSULE BY MOUTH EVERY DAY, Disp: 90 capsule, Rfl: 1   meloxicam (MOBIC) 15 MG tablet, Take 1 tablet (15 mg total) by mouth daily., Disp: 30 tablet, Rfl: 0   prednisoLONE acetate (PRED FORTE) 1 % ophthalmic suspension, Place 1 drop into the left eye daily., Disp: , Rfl:    ramipril (ALTACE) 2.5 MG capsule, Take 1 capsule (2.5 mg total) by mouth daily., Disp: 90 capsule, Rfl: 3   rosuvastatin (CRESTOR) 40 MG tablet, Take 1 tablet (40 mg total) by mouth daily., Disp: 90 tablet, Rfl: 3   triamcinolone cream (KENALOG) 0.1 %, Apply 1 application topically 2 (two) times daily., Disp: 30 g, Rfl: 0   valACYclovir (VALTREX) 1000 MG tablet, Take 1,000 mg by mouth daily., Disp: , Rfl:    verapamil (CALAN-SR) 240 MG CR tablet, TAKE 1 TABLET DAILY, Disp: 90 tablet, Rfl: 3   Objective:  Vitals:   10/28/22 1454  BP: 122/82  Pulse: 67  SpO2: 98%  Weight: 218 lb (98.9 kg)  Height: 5\' 8"  (1.727 m)      Body mass index is 33.15 kg/m.    Physical Exam:    General:  awake, alert oriented, no acute distress nontoxic Skin: no suspicious lesions or rashes Neuro:sensation intact and strength 5/5 with no deficits, no atrophy, normal muscle tone Psych: No signs of anxiety, depression or other mood disorder   Bilateral knee: Mild swelling No deformity Neg fluid wave, joint milking ROM Flex 110, Ext 0 NTTP over the quad tendon, medial fem condyle, lat fem condyle, patella,  plica, patella tendon, tibial tuberostiy, fibular head, posterior fossa, pes anserine bursa, gerdy's tubercle, medial jt line, lateral jt line Neg anterior and posterior drawer Neg lachman on the right, equivocal on left with mildly increased laxity, but still had a firm endpoint Neg sag sign Negative varus stress Negative valgus stress Negative McMurray Negative Thessaly   Gait normal     Electronically signed by:  [name] 3:03 PM 10/28/22

## 2022-10-28 ENCOUNTER — Ambulatory Visit: Payer: Medicare HMO | Admitting: Sports Medicine

## 2022-10-28 VITALS — BP 122/82 | HR 67 | Ht 68.0 in | Wt 218.0 lb

## 2022-10-28 DIAGNOSIS — M25561 Pain in right knee: Secondary | ICD-10-CM | POA: Diagnosis not present

## 2022-10-28 DIAGNOSIS — M170 Bilateral primary osteoarthritis of knee: Secondary | ICD-10-CM | POA: Diagnosis not present

## 2022-10-28 DIAGNOSIS — M25562 Pain in left knee: Secondary | ICD-10-CM

## 2022-10-28 DIAGNOSIS — G8929 Other chronic pain: Secondary | ICD-10-CM | POA: Diagnosis not present

## 2022-10-28 NOTE — Patient Instructions (Signed)
Continue HEP  Tylenol as needed for day to day pain relief  PT referral  4-6 week follow up

## 2022-10-30 DIAGNOSIS — L814 Other melanin hyperpigmentation: Secondary | ICD-10-CM | POA: Diagnosis not present

## 2022-10-30 DIAGNOSIS — L821 Other seborrheic keratosis: Secondary | ICD-10-CM | POA: Diagnosis not present

## 2022-10-30 DIAGNOSIS — L570 Actinic keratosis: Secondary | ICD-10-CM | POA: Diagnosis not present

## 2022-10-30 DIAGNOSIS — D225 Melanocytic nevi of trunk: Secondary | ICD-10-CM | POA: Diagnosis not present

## 2022-10-30 DIAGNOSIS — Z872 Personal history of diseases of the skin and subcutaneous tissue: Secondary | ICD-10-CM | POA: Diagnosis not present

## 2022-11-04 NOTE — Therapy (Signed)
OUTPATIENT PHYSICAL THERAPY LOWER EXTREMITY EVALUATION   Patient Name: Eric Dillon MRN: [id number] DOB:Feb 10, 1956, 67 y.o., male Today's Date: 11/05/2022  END OF SESSION:  PT End of Session - 11/05/22 1506     Visit Number 1    Date for PT Re-Evaluation 01/07/23    Authorization Type [employer]    PT Start Time 1505    PT Stop Time 1545    PT Time Calculation (min) 40 min    Activity Tolerance Patient tolerated treatment well    Behavior During Therapy [facility] for tasks assessed/performed             Past Medical History:  Diagnosis Date   Barrett's esophagus - RESOLVED 2008   RESOLVED ON SUBSEQUENT EXAMS   Carotid artery dissection (HCC) 2011   L   Diverticulosis    GERD (gastroesophageal reflux disease)    Goiter    Hemorrhoids    Horner's syndrome 2012   Hyperlipidemia    Hypertension    Personal history of colonic adenomas 01/14/2008   Prostate cancer (HCC) 2010   Stroke (HCC) 1996   FROM DISSECTING R CAROID ARTERY    Past Surgical History:  Procedure Laterality Date   BIOPSY THYROID  2007   colloid  admixed with scant follicular epithelium   CAROTID ARTERY ANGIOPLASTY  2011   L   COLONOSCOPY     COLONOSCOPY W/ BIOPSIES AND POLYPECTOMY  multiple   adenomatous polyps, internal hemorrhoids, diverticulosis   CORNEAL TRANSPLANT Left 2015    corneal pannus & perforation,[facility]   DISSECTION OF CAROID ARTERY     WITH CVA   KNEE SURGERY     RIGHT..TO STRIGHTEN PATELLA   ROBOTIC PROSTATECTOMY  2011   Dr Crecencio Mc   UPPER GASTROINTESTINAL ENDOSCOPY  multiple   w/dilation, Barrett's , esophageal ring, hiatal hernia, gastritis   Patient Active Problem List   Diagnosis Date Noted   Pain in both knees 09/04/2022   Mid back pain on right side 05/18/2022   Obesity (BMI 30.0-34.9) 08/29/2021   Fibromuscular dysplasia (HCC) 09/14/2019   Umbilical hernia without obstruction or gangrene 09/14/2019   DOE (dyspnea on exertion) 09/14/2019   Subclinical  hyperthyroidism 03/10/2018   Skin cancer 09/09/2017   Herpes zoster without complication 01/28/2017   Diabetes (HCC) 06/08/2015   H/O cornea transplant, left 05/30/2015   Nuclear sclerosis of both eyes 12/14/2014   Psoriasis 12/08/2014   Interstitial keratitis of left eye 06/18/2013   Corneal pannus 04/18/2013   Dissection of carotid artery (HCC) 01/19/2010   HORNER'S SYNDROME 01/04/2010   PROSTATE CANCER, HX OF 12/28/2008   Personal history of colonic adenomas 01/14/2008   HYPERLIPIDEMIA 03/03/2007   Essential hypertension 03/03/2007   GERD with stricture 03/03/2007   History of CVA (cerebrovascular accident) w/ resiual tinnitus 03/03/2007    PCP: Cheryll Cockayne  REFERRING PROVIDER: Richardean Sale  REFERRING DIAG:  M25.561,M25.562,G89.29 (ICD-10-CM) - Chronic pain of both knees  M17.0 (ICD-10-CM) - Bilateral primary osteoarthritis of knee    THERAPY DIAG:  Acute pain of both knees  Rationale for Evaluation and Treatment: Rehabilitation  ONSET DATE: 10/28/22  SUBJECTIVE:   SUBJECTIVE STATEMENT: 6-8 weeks ago I took a fall. I went to the doctor and did some cortisone shots, it was good until it wasn't. It is slowly getting bad again. I have pain with stairs.   PERTINENT HISTORY: Patient is a 67 year old male complaining of bilat knee pain. Patient states patient was carrying chairs down the steps  and legs got tangled with the legs of the chairs and fell down the last two steps. Patient knees feels very very tight. Patient states that the left knee the pain radiates from the back of the knee laterally to the front. The right knee is located more so to the lateral right knee. Left knee hit the ground and not sure how the right knee hit because everything was tangle in the chairs. Patient used ice packs for the swelling. Patient states he was fine for a few days and progressively have gotten worse. This happened a week after mothers day. Pain is worse when going up or down steps or  trying to bend over. Walking is Just uncomfortable    09/30/2022 Patient states he is better not a 100 percent but better than when he came in    10/28/2022 Patient states that he is better, not a 100 % but much bette  PAIN:  Are you having pain? Yes: NPRS scale: 4/10 Pain location: top of both knees Pain description: dull, ache, every now then a sharp pain, and sometimes L knee gives out Aggravating factors: stairs, closing recliner chairs with my legs Relieving factors: cortisone shot helped  PRECAUTIONS: None  RED FLAGS: None   WEIGHT BEARING RESTRICTIONS: No  FALLS:  Has patient fallen in last 6 months? Yes. Number of falls 1 fall on the stairs, missed the last step  LIVING ENVIRONMENT: Lives with: lives with their family Lives in: House/apartment Stairs: Yes: Internal: 14 steps; on right going up and External: 2 steps; on right going up  OCCUPATION: Retired  PLOF: Independent  PATIENT GOALS: have no pain  NEXT MD VISIT: 11/26/22  OBJECTIVE:   DIAGNOSTIC FINDINGS: LEFT KNEE IMPRESSION: 1. Mild patellofemoral compartment degenerative changes. Enthesopathic changes off the superior patella. 2. A soft tissue calcification adjacent to the distal medial femur is favored to be nonacute, likely from a nonacute MCL injury. Recommend clinical correlation. 3. No other abnormalities.  RIGHT KNEE IMPRESSION: Mild tricompartmental degenerative changes. Minimal medial compartment loss of joint space. Enthesopathic changes off the superior patella.    PATIENT SURVEYS:  FOTO [id number]  COGNITION: Overall cognitive status: Within functional limits for tasks assessed     SENSATION: WFL   MUSCLE LENGTH: Hamstrings: some tightness in BLE  POSTURE: rounded shoulders and forward head  PALPATION: N/A  LOWER EXTREMITY ROM: WFL    LOWER EXTREMITY MMT: grossly 5/5 BLE    FUNCTIONAL TESTS:  5 times sit to stand: 14.47s some pain in the knees  Timed up and go (TUG): 9.64s    GAIT: Distance walked: in clinic distances Assistive device utilized: None Level of assistance: Complete Independence    TODAY'S TREATMENT:                                                                                                                              DATE: EVAL 11/05/22    PATIENT EDUCATION:  Education details: POC  and HEP Person educated: Patient Education method: Explanation Education comprehension: verbalized understanding  HOME EXERCISE PROGRAM: Access Code: [email or web address] URL: [email or web address] Date: 11/05/2022 Prepared by: Cassie Freer  Exercises - Sit to Stand  - 1 x daily - 7 x weekly - 2 sets - 10 reps - Seated Knee Extension with Resistance  - 1 x daily - 7 x weekly - 2 sets - 10 reps - Seated Knee Flexion with Anchored Resistance  - 1 x daily - 7 x weekly - 2 sets - 10 reps - Side Stepping with Resistance at Ankles  - 1 x daily - 7 x weekly - 2 sets - 10 reps - Seated Hamstring Stretch  - 1 x daily - 7 x weekly - 2 sets - 1 reps - 30 hold  ASSESSMENT:  CLINICAL IMPRESSION: Patient is a 67 y.o. male who was seen today for physical therapy evaluation and treatment for bilateral knee pain. He reports the pain began about 6-8 weeks ago when he fell by missing the last step of the stairs. He fell on to his knees. He reports ongoing pain mostly just with stair navigation. Patient presents with good overall strength and ROM and would like to work on ways to decrease his pain. He also reports feeling some weakness in his knees. He will benefit from skilled PT to address his bilateral knee pain and be able to navigate stairs without difficulty.   OBJECTIVE IMPAIRMENTS: pain.   ACTIVITY LIMITATIONS: stairs  REHAB POTENTIAL: Good  CLINICAL DECISION MAKING: Stable/uncomplicated  EVALUATION COMPLEXITY: Low   GOALS: Goals reviewed with patient? Yes  SHORT TERM GOALS: Target date: 12/03/22  Patient will be independent with initial HEP. Goal  status: INITIAL   LONG TERM GOALS: Target date: 01/07/23  Patient will be independent with advanced/ongoing HEP to improve outcomes and carryover.  Goal status: INITIAL  2.  Patient will report at least 75% improvement in bilateral knee pain to improve QOL. Baseline: 4/10 Goal status: INITIAL  3.  Patient will demonstrate STS from chair w/o pain <12s Baseline: 14.47s w/pain Goal status: INITIAL  4. Patient will be able to ascend/descend stairs with reciprocal step pattern and no pain safely to access home and community.  Baseline: pain with stairs Goal status: INITIAL   PLAN:  PT FREQUENCY: 1-2x/week  PT DURATION: 8 weeks  PLANNED INTERVENTIONS: Therapeutic exercises, Therapeutic activity, Neuromuscular re-education, Balance training, Gait training, Patient/Family education, Self Care, Joint mobilization, Stair training, Dry Needling, Electrical stimulation, Cryotherapy, Moist heat, Taping, Vasopneumatic device, Ionotophoresis 4mg /ml Dexamethasone, and Manual therapy  PLAN FOR NEXT SESSION: knee strengthening, practice stairs    [place], PT 11/05/2022, 3:50 PM

## 2022-11-05 ENCOUNTER — Ambulatory Visit: Payer: Medicare HMO | Attending: Sports Medicine

## 2022-11-05 DIAGNOSIS — Z789 Other specified health status: Secondary | ICD-10-CM | POA: Diagnosis not present

## 2022-11-05 DIAGNOSIS — M25561 Pain in right knee: Secondary | ICD-10-CM | POA: Diagnosis not present

## 2022-11-05 DIAGNOSIS — M170 Bilateral primary osteoarthritis of knee: Secondary | ICD-10-CM | POA: Insufficient documentation

## 2022-11-05 DIAGNOSIS — G8929 Other chronic pain: Secondary | ICD-10-CM | POA: Diagnosis not present

## 2022-11-05 DIAGNOSIS — M25562 Pain in left knee: Secondary | ICD-10-CM | POA: Insufficient documentation

## 2022-11-12 NOTE — Therapy (Signed)
OUTPATIENT PHYSICAL THERAPY LOWER EXTREMITY TREATMENT   Patient Name: Eric Dillon MRN: [id number] DOB:[birth year], 67 y.o., male Today's Date: 11/13/2022  END OF SESSION:  PT End of Session - 11/13/22 1446     Visit Number 2    Date for PT Re-Evaluation 01/07/23    Authorization Type [employer]    PT Start Time 1445    PT Stop Time 1530    PT Time Calculation (min) 45 min    Activity Tolerance Patient tolerated treatment well    Behavior During Therapy [facility] for tasks assessed/performed              Past Medical History:  Diagnosis Date   Barrett's esophagus - RESOLVED 2008   RESOLVED ON SUBSEQUENT EXAMS   Carotid artery dissection (HCC) 2011   L   Diverticulosis    GERD (gastroesophageal reflux disease)    Goiter    Hemorrhoids    Horner's syndrome 2012   Hyperlipidemia    Hypertension    Personal history of colonic adenomas 01/14/2008   Prostate cancer (HCC) 2010   Stroke (HCC) 1996   FROM DISSECTING R CAROID ARTERY    Past Surgical History:  Procedure Laterality Date   BIOPSY THYROID  2007   colloid  admixed with scant follicular epithelium   CAROTID ARTERY ANGIOPLASTY  2011   L   COLONOSCOPY     COLONOSCOPY W/ BIOPSIES AND POLYPECTOMY  multiple   adenomatous polyps, internal hemorrhoids, diverticulosis   CORNEAL TRANSPLANT Left 2015    corneal pannus & perforation,[facility]   DISSECTION OF CAROID ARTERY     WITH CVA   KNEE SURGERY     RIGHT..TO STRIGHTEN PATELLA   ROBOTIC PROSTATECTOMY  2011   Dr Crecencio Mc   UPPER GASTROINTESTINAL ENDOSCOPY  multiple   w/dilation, Barrett's , esophageal ring, hiatal hernia, gastritis   Patient Active Problem List   Diagnosis Date Noted   Pain in both knees 09/04/2022   Mid back pain on right side 05/18/2022   Obesity (BMI 30.0-34.9) 08/29/2021   Fibromuscular dysplasia (HCC) 09/14/2019   Umbilical hernia without obstruction or gangrene 09/14/2019   DOE (dyspnea on exertion) 09/14/2019   Subclinical  hyperthyroidism 03/10/2018   Skin cancer 09/09/2017   Herpes zoster without complication 01/28/2017   Diabetes (HCC) 06/08/2015   H/O cornea transplant, left 05/30/2015   Nuclear sclerosis of both eyes 12/14/2014   Psoriasis 12/08/2014   Interstitial keratitis of left eye 06/18/2013   Corneal pannus 04/18/2013   Dissection of carotid artery (HCC) 01/19/2010   HORNER'S SYNDROME 01/04/2010   PROSTATE CANCER, HX OF 12/28/2008   Personal history of colonic adenomas 01/14/2008   HYPERLIPIDEMIA 03/03/2007   Essential hypertension 03/03/2007   GERD with stricture 03/03/2007   History of CVA (cerebrovascular accident) w/ resiual tinnitus 03/03/2007    PCP: Cheryll Cockayne  REFERRING PROVIDER: Richardean Sale  REFERRING DIAG:  M25.561,M25.562,G89.29 (ICD-10-CM) - Chronic pain of both knees  M17.0 (ICD-10-CM) - Bilateral primary osteoarthritis of knee    THERAPY DIAG:  Acute pain of both knees  Difficulty navigating stairs  Rationale for Evaluation and Treatment: Rehabilitation  ONSET DATE: 10/28/22  SUBJECTIVE:   SUBJECTIVE STATEMENT: I did the exercises, had a little problem on Saturday with the sitting and standing. I took the rest of the day off. Returned to doing normal HEP Monday and was fine.   PERTINENT HISTORY: Patient is a 67 year old male complaining of bilat knee pain. Patient states patient was carrying chairs  down the steps and legs got tangled with the legs of the chairs and fell down the last two steps. Patient knees feels very very tight. Patient states that the left knee the pain radiates from the back of the knee laterally to the front. The right knee is located more so to the lateral right knee. Left knee hit the ground and not sure how the right knee hit because everything was tangle in the chairs. Patient used ice packs for the swelling. Patient states he was fine for a few days and progressively have gotten worse. This happened a week after mothers day. Pain is  worse when going up or down steps or trying to bend over. Walking is Just uncomfortable    09/30/2022 Patient states he is better not a 100 percent but better than when he came in    10/28/2022 Patient states that he is better, not a 100 % but much bette  PAIN:  Are you having pain? Yes: NPRS scale: 4/10 Pain location: top of both knees Pain description: dull, ache, every now then a sharp pain, and sometimes L knee gives out Aggravating factors: stairs, closing recliner chairs with my legs Relieving factors: cortisone shot helped  PRECAUTIONS: None  RED FLAGS: None   WEIGHT BEARING RESTRICTIONS: No  FALLS:  Has patient fallen in last 6 months? Yes. Number of falls 1 fall on the stairs, missed the last step  LIVING ENVIRONMENT: Lives with: lives with their family Lives in: House/apartment Stairs: Yes: Internal: 14 steps; on right going up and External: 2 steps; on right going up  OCCUPATION: Retired  PLOF: Independent  PATIENT GOALS: have no pain  NEXT MD VISIT: 11/26/22  OBJECTIVE:   DIAGNOSTIC FINDINGS: LEFT KNEE IMPRESSION: 1. Mild patellofemoral compartment degenerative changes. Enthesopathic changes off the superior patella. 2. A soft tissue calcification adjacent to the distal medial femur is favored to be nonacute, likely from a nonacute MCL injury. Recommend clinical correlation. 3. No other abnormalities.  RIGHT KNEE IMPRESSION: Mild tricompartmental degenerative changes. Minimal medial compartment loss of joint space. Enthesopathic changes off the superior patella.    PATIENT SURVEYS:  FOTO 72  COGNITION: Overall cognitive status: Within functional limits for tasks assessed     SENSATION: WFL   MUSCLE LENGTH: Hamstrings: some tightness in BLE  POSTURE: rounded shoulders and forward head  PALPATION: N/A  LOWER EXTREMITY ROM: WFL    LOWER EXTREMITY MMT: grossly 5/5 BLE    FUNCTIONAL TESTS:  5 times sit to stand: 14.47s some pain in the knees   Timed up and go (TUG): 9.64s   GAIT: Distance walked: in clinic distances Assistive device utilized: None Level of assistance: Complete Independence    TODAY'S TREATMENT:                                                                                                                              DATE:  11/13/22 NuStep L5 x35mins  Step ups  6" Lateral step ups 6"  Calf raises 2x10  Calf stretch 30s  HS curls 25# 2x10 Leg ext 10# 2x10 Resisted gait 30# 4 ways x4  Walking on beam STS 2x10 Leg press 20# 2x10    EVAL 11/05/22    PATIENT EDUCATION:  Education details: POC and HEP Person educated: Patient Education method: Explanation Education comprehension: verbalized understanding  HOME EXERCISE PROGRAM: Access Code: [email or web address] URL: [email or web address] Date: 11/05/2022 Prepared by: Cassie Freer  Exercises - Sit to Stand  - 1 x daily - 7 x weekly - 2 sets - 10 reps - Seated Knee Extension with Resistance  - 1 x daily - 7 x weekly - 2 sets - 10 reps - Seated Knee Flexion with Anchored Resistance  - 1 x daily - 7 x weekly - 2 sets - 10 reps - Side Stepping with Resistance at Ankles  - 1 x daily - 7 x weekly - 2 sets - 10 reps - Seated Hamstring Stretch  - 1 x daily - 7 x weekly - 2 sets - 1 reps - 30 hold  ASSESSMENT:  CLINICAL IMPRESSION: Patient is a 67 y.o. male who was seen today for physical therapy treatment for bilateral knee pain. Reports the pain a 2 or 3. Began strengthening regimen for bilateral knees. Able to tolerate all interventions well without any complaints of pain. No increase in pain reported. He will benefit from skilled PT to address his bilateral knee pain and be able to navigate stairs without difficulty. Educated on possible soreness considering the work that was done today.   OBJECTIVE IMPAIRMENTS: pain.   ACTIVITY LIMITATIONS: stairs  REHAB POTENTIAL: Good  CLINICAL DECISION MAKING: Stable/uncomplicated  EVALUATION COMPLEXITY:  Low   GOALS: Goals reviewed with patient? Yes  SHORT TERM GOALS: Target date: 12/03/22  Patient will be independent with initial HEP. Goal status: INITIAL   LONG TERM GOALS: Target date: 01/07/23  Patient will be independent with advanced/ongoing HEP to improve outcomes and carryover.  Goal status: INITIAL  2.  Patient will report at least 75% improvement in bilateral knee pain to improve QOL. Baseline: 4/10 Goal status: INITIAL  3.  Patient will demonstrate STS from chair w/o pain <12s Baseline: 14.47s w/pain Goal status: INITIAL  4. Patient will be able to ascend/descend stairs with reciprocal step pattern and no pain safely to access home and community.  Baseline: pain with stairs Goal status: INITIAL   PLAN:  PT FREQUENCY: 1-2x/week  PT DURATION: 8 weeks  PLANNED INTERVENTIONS: Therapeutic exercises, Therapeutic activity, Neuromuscular re-education, Balance training, Gait training, Patient/Family education, Self Care, Joint mobilization, Stair training, Dry Needling, Electrical stimulation, Cryotherapy, Moist heat, Taping, Vasopneumatic device, Ionotophoresis 4mg /ml Dexamethasone, and Manual therapy  PLAN FOR NEXT SESSION: knee strengthening, practice stairs    [place], PT 11/13/2022, 3:28 PM

## 2022-11-13 ENCOUNTER — Ambulatory Visit: Payer: Medicare HMO

## 2022-11-13 DIAGNOSIS — M170 Bilateral primary osteoarthritis of knee: Secondary | ICD-10-CM | POA: Diagnosis not present

## 2022-11-13 DIAGNOSIS — G8929 Other chronic pain: Secondary | ICD-10-CM | POA: Diagnosis not present

## 2022-11-13 DIAGNOSIS — M25562 Pain in left knee: Secondary | ICD-10-CM | POA: Diagnosis not present

## 2022-11-13 DIAGNOSIS — M25561 Pain in right knee: Secondary | ICD-10-CM

## 2022-11-13 DIAGNOSIS — Z789 Other specified health status: Secondary | ICD-10-CM

## 2022-11-19 ENCOUNTER — Ambulatory Visit: Payer: Medicare HMO | Admitting: Physical Therapy

## 2022-11-19 ENCOUNTER — Encounter: Payer: Self-pay | Admitting: Physical Therapy

## 2022-11-19 DIAGNOSIS — M25561 Pain in right knee: Secondary | ICD-10-CM | POA: Diagnosis not present

## 2022-11-19 DIAGNOSIS — G8929 Other chronic pain: Secondary | ICD-10-CM | POA: Diagnosis not present

## 2022-11-19 DIAGNOSIS — M25562 Pain in left knee: Secondary | ICD-10-CM | POA: Diagnosis not present

## 2022-11-19 DIAGNOSIS — M170 Bilateral primary osteoarthritis of knee: Secondary | ICD-10-CM | POA: Diagnosis not present

## 2022-11-19 DIAGNOSIS — Z789 Other specified health status: Secondary | ICD-10-CM | POA: Diagnosis not present

## 2022-11-19 NOTE — Therapy (Signed)
OUTPATIENT PHYSICAL THERAPY LOWER EXTREMITY TREATMENT   Patient Name: Eric Dillon MRN: [id number] DOB:Nov 18, 1955, 67 y.o., male Today's Date: 11/19/2022  END OF SESSION:  PT End of Session - 11/19/22 1403     Visit Number 3    Date for PT Re-Evaluation 01/07/23    Authorization Type [employer] Medicare    PT Start Time 1350    PT Stop Time 1428    PT Time Calculation (min) 38 min    Activity Tolerance Patient tolerated treatment well    Behavior During Therapy WFL for tasks assessed/performed               Past Medical History:  Diagnosis Date   Barrett's esophagus - RESOLVED 2008   RESOLVED ON SUBSEQUENT EXAMS   Carotid artery dissection (HCC) 2011   L   Diverticulosis    GERD (gastroesophageal reflux disease)    Goiter    Hemorrhoids    Horner's syndrome 2012   Hyperlipidemia    Hypertension    Personal history of colonic adenomas 01/14/2008   Prostate cancer (HCC) 2010   Stroke (HCC) 1996   FROM DISSECTING R CAROID ARTERY    Past Surgical History:  Procedure Laterality Date   BIOPSY THYROID  2007   colloid  admixed with scant follicular epithelium   CAROTID ARTERY ANGIOPLASTY  2011   L   COLONOSCOPY     COLONOSCOPY W/ BIOPSIES AND POLYPECTOMY  multiple   adenomatous polyps, internal hemorrhoids, diverticulosis   CORNEAL TRANSPLANT Left 2015    corneal pannus & perforation,[facility]   DISSECTION OF CAROID ARTERY     WITH CVA   KNEE SURGERY     RIGHT..TO STRIGHTEN PATELLA   ROBOTIC PROSTATECTOMY  2011   Dr Crecencio Mc   UPPER GASTROINTESTINAL ENDOSCOPY  multiple   w/dilation, Barrett's , esophageal ring, hiatal hernia, gastritis   Patient Active Problem List   Diagnosis Date Noted   Pain in both knees 09/04/2022   Mid back pain on right side 05/18/2022   Obesity (BMI 30.0-34.9) 08/29/2021   Fibromuscular dysplasia (HCC) 09/14/2019   Umbilical hernia without obstruction or gangrene 09/14/2019   DOE (dyspnea on exertion) 09/14/2019   Subclinical  hyperthyroidism 03/10/2018   Skin cancer 09/09/2017   Herpes zoster without complication 01/28/2017   Diabetes (HCC) 06/08/2015   H/O cornea transplant, left 05/30/2015   Nuclear sclerosis of both eyes 12/14/2014   Psoriasis 12/08/2014   Interstitial keratitis of left eye 06/18/2013   Corneal pannus 04/18/2013   Dissection of carotid artery (HCC) 01/19/2010   HORNER'S SYNDROME 01/04/2010   PROSTATE CANCER, HX OF 12/28/2008   Personal history of colonic adenomas 01/14/2008   HYPERLIPIDEMIA 03/03/2007   Essential hypertension 03/03/2007   GERD with stricture 03/03/2007   History of CVA (cerebrovascular accident) w/ resiual tinnitus 03/03/2007    PCP: Cheryll Cockayne  REFERRING PROVIDER: Richardean Sale  REFERRING DIAG:  M25.561,M25.562,G89.29 (ICD-10-CM) - Chronic pain of both knees  M17.0 (ICD-10-CM) - Bilateral primary osteoarthritis of knee    THERAPY DIAG:  Acute pain of both knees  Difficulty navigating stairs  Rationale for Evaluation and Treatment: Rehabilitation  ONSET DATE: 10/28/22  SUBJECTIVE:   SUBJECTIVE STATEMENT:  I've had a busy day cooking with my church, knees are actually feeling better. HEP is going well, walking is fine but steps still bother me. Had to do a lot of steps this weekend.    PERTINENT HISTORY: Patient is a 67 year old male complaining of bilat knee pain.  Patient states patient was carrying chairs down the steps and legs got tangled with the legs of the chairs and fell down the last two steps. Patient knees feels very very tight. Patient states that the left knee the pain radiates from the back of the knee laterally to the front. The right knee is located more so to the lateral right knee. Left knee hit the ground and not sure how the right knee hit because everything was tangle in the chairs. Patient used ice packs for the swelling. Patient states he was fine for a few days and progressively have gotten worse. This happened a week after mothers  day. Pain is worse when going up or down steps or trying to bend over. Walking is Just uncomfortable    09/30/2022 Patient states he is better not a 100 percent but better than when he came in    10/28/2022 Patient states that he is better, not a 100 % but much bette  PAIN:  Are you having pain? Yes: NPRS scale: 2/10 Pain location: top of both knees Pain description: dull, ache, every now then a sharp pain, and sometimes L knee gives out Aggravating factors: stairs, closing recliner chairs with my legs Relieving factors: cortisone shot helped  PRECAUTIONS: None  RED FLAGS: None   WEIGHT BEARING RESTRICTIONS: No  FALLS:  Has patient fallen in last 6 months? Yes. Number of falls 1 fall on the stairs, missed the last step  LIVING ENVIRONMENT: Lives with: lives with their family Lives in: House/apartment Stairs: Yes: Internal: 14 steps; on right going up and External: 2 steps; on right going up  OCCUPATION: Retired  PLOF: Independent  PATIENT GOALS: have no pain  NEXT MD VISIT: 11/26/22  OBJECTIVE:   DIAGNOSTIC FINDINGS: LEFT KNEE IMPRESSION: 1. Mild patellofemoral compartment degenerative changes. Enthesopathic changes off the superior patella. 2. A soft tissue calcification adjacent to the distal medial femur is favored to be nonacute, likely from a nonacute MCL injury. Recommend clinical correlation. 3. No other abnormalities.  RIGHT KNEE IMPRESSION: Mild tricompartmental degenerative changes. Minimal medial compartment loss of joint space. Enthesopathic changes off the superior patella.    PATIENT SURVEYS:  FOTO 72  COGNITION: Overall cognitive status: Within functional limits for tasks assessed     SENSATION: WFL   MUSCLE LENGTH: Hamstrings: some tightness in BLE  POSTURE: rounded shoulders and forward head  PALPATION: N/A  LOWER EXTREMITY ROM: WFL    LOWER EXTREMITY MMT: grossly 5/5 BLE    FUNCTIONAL TESTS:  5 times sit to stand: 14.47s some pain  in the knees  Timed up and go (TUG): 9.64s   GAIT: Distance walked: in clinic distances Assistive device utilized: None Level of assistance: Complete Independence    TODAY'S TREATMENT:                                                                                                                              DATE:  11/19/22  TherEx  Nustep L5 x8 minutes BLEs only Bridges + ABD into green TB x12 Sidelying hip ABD with green TB x10 B Walking bridges x10 HS stretches 2x30 seconds B Figure 4 piriformis 2x30 seconds B  Forward step ups 6 inch step 1x10 B Wall squats x10 self selected depth cues for midline  Hip hikes x10 B Hip hikes + ABD x10 B Sideways step ups 6 inch step x10 B Forward step downs 4 inch step x10 B STS from mat table green TB around knees x10 Tandem stance 2x30 seconds B solid surface       11/13/22 NuStep L5 x57mins  Step ups 6" Lateral step ups 6"  Calf raises 2x10  Calf stretch 30s  HS curls 25# 2x10 Leg ext 10# 2x10 Resisted gait 30# 4 ways x4  Walking on beam STS 2x10 Leg press 20# 2x10    EVAL 11/05/22    PATIENT EDUCATION:  Education details: POC and HEP Person educated: Patient Education method: Explanation Education comprehension: verbalized understanding  HOME EXERCISE PROGRAM: Access Code: [email or web address] URL: [email or web address] Date: 11/05/2022 Prepared by: Cassie Freer  Exercises - Sit to Stand  - 1 x daily - 7 x weekly - 2 sets - 10 reps - Seated Knee Extension with Resistance  - 1 x daily - 7 x weekly - 2 sets - 10 reps - Seated Knee Flexion with Anchored Resistance  - 1 x daily - 7 x weekly - 2 sets - 10 reps - Side Stepping with Resistance at Ankles  - 1 x daily - 7 x weekly - 2 sets - 10 reps - Seated Hamstring Stretch  - 1 x daily - 7 x weekly - 2 sets - 1 reps - 30 hold  ASSESSMENT:  CLINICAL IMPRESSION:  Mr Wiltgen arrives today doing well, we continued focus on functional strengthening for hips and  knees with good tolerance of session noted. Will continue to progress as tolerated.   OBJECTIVE IMPAIRMENTS: pain.   ACTIVITY LIMITATIONS: stairs  REHAB POTENTIAL: Good  CLINICAL DECISION MAKING: Stable/uncomplicated  EVALUATION COMPLEXITY: Low   GOALS: Goals reviewed with patient? Yes  SHORT TERM GOALS: Target date: 12/03/22  Patient will be independent with initial HEP. Goal status: INITIAL   LONG TERM GOALS: Target date: 01/07/23  Patient will be independent with advanced/ongoing HEP to improve outcomes and carryover.  Goal status: INITIAL  2.  Patient will report at least 75% improvement in bilateral knee pain to improve QOL. Baseline: 4/10 Goal status: INITIAL  3.  Patient will demonstrate STS from chair w/o pain <12s Baseline: 14.47s w/pain Goal status: INITIAL  4. Patient will be able to ascend/descend stairs with reciprocal step pattern and no pain safely to access home and community.  Baseline: pain with stairs Goal status: INITIAL   PLAN:  PT FREQUENCY: 1-2x/week  PT DURATION: 8 weeks  PLANNED INTERVENTIONS: Therapeutic exercises, Therapeutic activity, Neuromuscular re-education, Balance training, Gait training, Patient/Family education, Self Care, Joint mobilization, Stair training, Dry Needling, Electrical stimulation, Cryotherapy, Moist heat, Taping, Vasopneumatic device, Ionotophoresis 4mg /ml Dexamethasone, and Manual therapy  PLAN FOR NEXT SESSION: knee strengthening, practice stairs, consider HEP update, continue to work in balance   Nedra Hai, PT, DPT 11/19/22 2:28 PM

## 2022-11-21 ENCOUNTER — Other Ambulatory Visit: Payer: Self-pay | Admitting: Internal Medicine

## 2022-11-22 NOTE — Progress Notes (Unsigned)
    Eric Dillon D.[name] [address] [place] [address] Phone: [phone]   Assessment and Plan:     There are no diagnoses linked to this encounter.  ***   Pertinent previous records reviewed include ***   Follow Up: ***     Subjective:   I, Eric Dillon, am serving as a Neurosurgeon for Doctor Richardean Sale   Chief Complaint: bilat knees   HPI:    09/05/22 Patient is a 67 year old male complaining of bilat knee pain. Patient states patient was carrying chairs down the steps and legs got tangled with the legs of the chairs and fell down the last two steps. Patient knees feels very very tight. Patient states that the left knee the pain radiates from the back of the knee laterally to the front. The right knee is located more so to the lateral right knee. Left knee hit the ground and not sure how the right knee hit because everything was tangle in the chairs. Patient used ice packs for the swelling. Patient states he was fine for a few days and progressively have gotten worse. This happened a week after mothers day. Pain is worse when going up or down steps or trying to bend over. Walking is Just uncomfortable     09/30/2022 Patient states he is better not a 100 percent but better than when he came in    10/28/2022 Patient states that he is better , not a 100 % but much better   11/26/2022 Patient states   Relevant Historical Information: GERD, hypertension, DM type II, history of prostate cancer Additional pertinent review of systems negative.   Current Outpatient Medications:    aspirin EC 81 MG tablet, Take 81 mg by mouth daily., Disp: , Rfl:    esomeprazole (NEXIUM) 40 MG capsule, Take 1 capsule (40 mg total) by mouth daily before breakfast. (Patient taking differently: Take 20 mg by mouth daily. Take 2 tablets daily), Disp: 90 capsule, Rfl: 3   hydrochlorothiazide (MICROZIDE) 12.5 MG capsule, TAKE 1 CAPSULE BY MOUTH EVERY DAY,  Disp: 90 capsule, Rfl: 1   meloxicam (MOBIC) 15 MG tablet, Take 1 tablet (15 mg total) by mouth daily., Disp: 30 tablet, Rfl: 0   prednisoLONE acetate (PRED FORTE) 1 % ophthalmic suspension, Place 1 drop into the left eye daily., Disp: , Rfl:    ramipril (ALTACE) 2.5 MG capsule, Take 1 capsule (2.5 mg total) by mouth daily., Disp: 90 capsule, Rfl: 3   rosuvastatin (CRESTOR) 40 MG tablet, Take 1 tablet (40 mg total) by mouth daily., Disp: 90 tablet, Rfl: 3   triamcinolone cream (KENALOG) 0.1 %, Apply 1 application topically 2 (two) times daily., Disp: 30 g, Rfl: 0   valACYclovir (VALTREX) 1000 MG tablet, Take 1,000 mg by mouth daily., Disp: , Rfl:    verapamil (CALAN-SR) 240 MG CR tablet, TAKE 1 TABLET BY MOUTH EVERY DAY, Disp: 90 tablet, Rfl: 2   Objective:     There were no vitals filed for this visit.    There is no height or weight on file to calculate BMI.    Physical Exam:    ***   Electronically signed by:  Eric Dillon D.[name] 7:18 AM 11/22/22

## 2022-11-26 ENCOUNTER — Ambulatory Visit: Payer: Medicare HMO | Admitting: Sports Medicine

## 2022-11-26 VITALS — BP 132/86 | HR 78 | Ht 68.0 in | Wt 214.0 lb

## 2022-11-26 DIAGNOSIS — M170 Bilateral primary osteoarthritis of knee: Secondary | ICD-10-CM

## 2022-11-26 DIAGNOSIS — G8929 Other chronic pain: Secondary | ICD-10-CM

## 2022-11-26 DIAGNOSIS — M25561 Pain in right knee: Secondary | ICD-10-CM

## 2022-11-26 DIAGNOSIS — M25562 Pain in left knee: Secondary | ICD-10-CM | POA: Diagnosis not present

## 2022-11-27 ENCOUNTER — Encounter: Payer: Self-pay | Admitting: Physical Therapy

## 2022-11-27 ENCOUNTER — Ambulatory Visit: Payer: Medicare HMO | Attending: Sports Medicine | Admitting: Physical Therapy

## 2022-11-27 DIAGNOSIS — M25561 Pain in right knee: Secondary | ICD-10-CM | POA: Diagnosis present

## 2022-11-27 DIAGNOSIS — Z789 Other specified health status: Secondary | ICD-10-CM | POA: Insufficient documentation

## 2022-11-27 DIAGNOSIS — M25562 Pain in left knee: Secondary | ICD-10-CM | POA: Insufficient documentation

## 2022-11-27 NOTE — Therapy (Signed)
OUTPATIENT PHYSICAL THERAPY LOWER EXTREMITY TREATMENT   Patient Name: Eric Dillon MRN: [id number] DOB:1955-09-16, 67 y.o., male Today's Date: 11/27/2022  END OF SESSION:  PT End of Session - 11/27/22 1459     Visit Number 4    Date for PT Re-Evaluation 01/07/23    Authorization Type [employer]    PT Start Time 1435    PT Stop Time 1513    PT Time Calculation (min) 38 min    Activity Tolerance Patient tolerated treatment well    Behavior During Therapy [facility] for tasks assessed/performed                Past Medical History:  Diagnosis Date   Barrett's esophagus - RESOLVED 2008   RESOLVED ON SUBSEQUENT EXAMS   Carotid artery dissection (HCC) 2011   L   Diverticulosis    GERD (gastroesophageal reflux disease)    Goiter    Hemorrhoids    Horner's syndrome 2012   Hyperlipidemia    Hypertension    Personal history of colonic adenomas 01/14/2008   Prostate cancer (HCC) 2010   Stroke (HCC) 1996   FROM DISSECTING R CAROID ARTERY    Past Surgical History:  Procedure Laterality Date   BIOPSY THYROID  2007   colloid  admixed with scant follicular epithelium   CAROTID ARTERY ANGIOPLASTY  2011   L   COLONOSCOPY     COLONOSCOPY W/ BIOPSIES AND POLYPECTOMY  multiple   adenomatous polyps, internal hemorrhoids, diverticulosis   CORNEAL TRANSPLANT Left 2015    corneal pannus & perforation,[facility]   DISSECTION OF CAROID ARTERY     WITH CVA   KNEE SURGERY     RIGHT..TO STRIGHTEN PATELLA   ROBOTIC PROSTATECTOMY  2011   Dr Crecencio Mc   UPPER GASTROINTESTINAL ENDOSCOPY  multiple   w/dilation, Barrett's , esophageal ring, hiatal hernia, gastritis   Patient Active Problem List   Diagnosis Date Noted   Pain in both knees 09/04/2022   Mid back pain on right side 05/18/2022   Obesity (BMI 30.0-34.9) 08/29/2021   Fibromuscular dysplasia (HCC) 09/14/2019   Umbilical hernia without obstruction or gangrene 09/14/2019   DOE (dyspnea on exertion) 09/14/2019   Subclinical  hyperthyroidism 03/10/2018   Skin cancer 09/09/2017   Herpes zoster without complication 01/28/2017   Diabetes (HCC) 06/08/2015   H/O cornea transplant, left 05/30/2015   Nuclear sclerosis of both eyes 12/14/2014   Psoriasis 12/08/2014   Interstitial keratitis of left eye 06/18/2013   Corneal pannus 04/18/2013   Dissection of carotid artery (HCC) 01/19/2010   HORNER'S SYNDROME 01/04/2010   PROSTATE CANCER, HX OF 12/28/2008   Personal history of colonic adenomas 01/14/2008   HYPERLIPIDEMIA 03/03/2007   Essential hypertension 03/03/2007   GERD with stricture 03/03/2007   History of CVA (cerebrovascular accident) w/ resiual tinnitus 03/03/2007    PCP: Cheryll Cockayne  REFERRING PROVIDER: Richardean Sale  REFERRING DIAG:  M25.561,M25.562,G89.29 (ICD-10-CM) - Chronic pain of both knees  M17.0 (ICD-10-CM) - Bilateral primary osteoarthritis of knee    THERAPY DIAG:  Acute pain of both knees  Difficulty navigating stairs  Rationale for Evaluation and Treatment: Rehabilitation  ONSET DATE: 10/28/22  SUBJECTIVE:   SUBJECTIVE STATEMENT:  Feeling good,  have been doing HEP and some exercises from PT at home and it is all helping. Really close to where I'd like to be, playing golf this weekend for the first time in about a year   PERTINENT HISTORY: Patient is a 67 year old male complaining  of bilat knee pain. Patient states patient was carrying chairs down the steps and legs got tangled with the legs of the chairs and fell down the last two steps. Patient knees feels very very tight. Patient states that the left knee the pain radiates from the back of the knee laterally to the front. The right knee is located more so to the lateral right knee. Left knee hit the ground and not sure how the right knee hit because everything was tangle in the chairs. Patient used ice packs for the swelling. Patient states he was fine for a few days and progressively have gotten worse. This happened a week  after mothers day. Pain is worse when going up or down steps or trying to bend over. Walking is Just uncomfortable    09/30/2022 Patient states he is better not a 100 percent but better than when he came in    10/28/2022 Patient states that he is better, not a 100 % but much bette  PAIN:  Are you having pain? Yes: NPRS scale: 1-2/10 Pain location: top of both knees Pain description: dull, ache, every now then a sharp pain, and sometimes L knee gives out Aggravating factors: stairs, closing recliner chairs with my legs Relieving factors: cortisone shot helped  PRECAUTIONS: None  RED FLAGS: None   WEIGHT BEARING RESTRICTIONS: No  FALLS:  Has patient fallen in last 6 months? Yes. Number of falls 1 fall on the stairs, missed the last step  LIVING ENVIRONMENT: Lives with: lives with their family Lives in: House/apartment Stairs: Yes: Internal: 14 steps; on right going up and External: 2 steps; on right going up  OCCUPATION: Retired  PLOF: Independent  PATIENT GOALS: have no pain  NEXT MD VISIT: 11/26/22  OBJECTIVE:   DIAGNOSTIC FINDINGS: LEFT KNEE IMPRESSION: 1. Mild patellofemoral compartment degenerative changes. Enthesopathic changes off the superior patella. 2. A soft tissue calcification adjacent to the distal medial femur is favored to be nonacute, likely from a nonacute MCL injury. Recommend clinical correlation. 3. No other abnormalities.  RIGHT KNEE IMPRESSION: Mild tricompartmental degenerative changes. Minimal medial compartment loss of joint space. Enthesopathic changes off the superior patella.    PATIENT SURVEYS:  FOTO 72  COGNITION: Overall cognitive status: Within functional limits for tasks assessed     SENSATION: WFL   MUSCLE LENGTH: Hamstrings: some tightness in BLE  POSTURE: rounded shoulders and forward head  PALPATION: N/A  LOWER EXTREMITY ROM: WFL    LOWER EXTREMITY MMT: grossly 5/5 BLE    FUNCTIONAL TESTS:  5 times sit to stand:  14.47s some pain in the knees  Timed up and go (TUG): 9.64s   GAIT: Distance walked: in clinic distances Assistive device utilized: None Level of assistance: Complete Independence    TODAY'S TREATMENT:  DATE:    11/27/22  TherEx  Nustep L5x8 minutes BLEs only  Bridge + ABD into green TB x16 Sidelying hip ABD green TB x16 B  Quadruped bird dogs x10 B green TB Quadruped hip extensions x10 B green TB  Figure 4 stretch 2x30 seconds B Supine quad + hip flexor stretch 2x30 seconds B Forward step ups x15 B 6 inch step Forward step downs x15 B from 4 inch step   Wall slides x15  Tandem stance x3 rounds B blue foam pad  Tandem gait x4 rounds in // bars forward and backwards SLS x3 rounds B solid surface   11/19/22  TherEx  Nustep L5 x8 minutes BLEs only Bridges + ABD into green TB x12 Sidelying hip ABD with green TB x10 B Walking bridges x10 HS stretches 2x30 seconds B Figure 4 piriformis 2x30 seconds B  Forward step ups 6 inch step 1x10 B Wall squats x10 self selected depth cues for midline  Hip hikes x10 B Hip hikes + ABD x10 B Sideways step ups 6 inch step x10 B Forward step downs 4 inch step x10 B STS from mat table green TB around knees x10 Tandem stance 2x30 seconds B solid surface       11/13/22 NuStep L5 x49mins  Step ups 6" Lateral step ups 6"  Calf raises 2x10  Calf stretch 30s  HS curls 25# 2x10 Leg ext 10# 2x10 Resisted gait 30# 4 ways x4  Walking on beam STS 2x10 Leg press 20# 2x10    EVAL 11/05/22    PATIENT EDUCATION:  Education details: POC and HEP Person educated: Patient Education method: Explanation Education comprehension: verbalized understanding  HOME EXERCISE PROGRAM: Access Code: [email or web address] URL: [email or web address] Date: 11/05/2022 Prepared by: Cassie Freer  Exercises - Sit to Stand  -  1 x daily - 7 x weekly - 2 sets - 10 reps - Seated Knee Extension with Resistance  - 1 x daily - 7 x weekly - 2 sets - 10 reps - Seated Knee Flexion with Anchored Resistance  - 1 x daily - 7 x weekly - 2 sets - 10 reps - Side Stepping with Resistance at Ankles  - 1 x daily - 7 x weekly - 2 sets - 10 reps - Seated Hamstring Stretch  - 1 x daily - 7 x weekly - 2 sets - 1 reps - 30 hold  ASSESSMENT:  CLINICAL IMPRESSION:  Mr Cianciulli arrives today feeling well, sounds like PT and HEP are really helping his pain. Did really well today, made sure we worked on quad and hip flexor flexibility as well. May be ready for DC to advanced HEP next visit.   OBJECTIVE IMPAIRMENTS: pain.   ACTIVITY LIMITATIONS: stairs  REHAB POTENTIAL: Good  CLINICAL DECISION MAKING: Stable/uncomplicated  EVALUATION COMPLEXITY: Low   GOALS: Goals reviewed with patient? Yes  SHORT TERM GOALS: Target date: 12/03/22  Patient will be independent with initial HEP. Goal status: INITIAL   LONG TERM GOALS: Target date: 01/07/23  Patient will be independent with advanced/ongoing HEP to improve outcomes and carryover.  Goal status: INITIAL  2.  Patient will report at least 75% improvement in bilateral knee pain to improve QOL. Baseline: 4/10 Goal status: INITIAL  3.  Patient will demonstrate STS from chair w/o pain <12s Baseline: 14.47s w/pain Goal status: INITIAL  4. Patient will be able to ascend/descend stairs with reciprocal step pattern and no pain safely to access home and community.  Baseline: pain with stairs Goal  status: INITIAL   PLAN:  PT FREQUENCY: 1-2x/week  PT DURATION: 8 weeks  PLANNED INTERVENTIONS: Therapeutic exercises, Therapeutic activity, Neuromuscular re-education, Balance training, Gait training, Patient/Family education, Self Care, Joint mobilization, Stair training, Dry Needling, Electrical stimulation, Cryotherapy, Moist heat, Taping, Vasopneumatic device, Ionotophoresis 4mg /ml  Dexamethasone, and Manual therapy  PLAN FOR NEXT SESSION: re-assess/likely DC or at least going on hold with advanced HEP, he is where he'd like to be functionally and pain wise   Nedra Hai, PT, DPT 11/27/22 3:14 PM

## 2022-12-03 ENCOUNTER — Encounter: Payer: Self-pay | Admitting: Physical Therapy

## 2022-12-03 ENCOUNTER — Ambulatory Visit: Payer: Medicare HMO | Admitting: Physical Therapy

## 2022-12-03 DIAGNOSIS — Z789 Other specified health status: Secondary | ICD-10-CM

## 2022-12-03 DIAGNOSIS — M25561 Pain in right knee: Secondary | ICD-10-CM | POA: Diagnosis not present

## 2022-12-03 DIAGNOSIS — M25562 Pain in left knee: Secondary | ICD-10-CM

## 2022-12-03 NOTE — Therapy (Signed)
OUTPATIENT PHYSICAL THERAPY LOWER EXTREMITY TREATMENT   Patient Name: Eric Dillon MRN: [id number] DOB:06-Dec-1955, 67 y.o., male Today's Date: 12/03/2022   PHYSICAL THERAPY DISCHARGE SUMMARY  Visits from Start of Care: 5  Current functional level related to goals / functional outcomes: See below    Remaining deficits: See below    Education / Equipment: See below    Patient agrees to discharge. Patient goals were met. Patient is being discharged due to meeting the stated rehab goals.   END OF SESSION:  PT End of Session - 12/03/22 1505     Visit Number 5    Number of Visits 5    Authorization Type [employer] Medicare    PT Start Time 1432    PT Stop Time 1457   DC all goals of PT met and no further skilled services warranted   PT Time Calculation (min) 25 min    Activity Tolerance Patient tolerated treatment well    Behavior During Therapy WFL for tasks assessed/performed                 Past Medical History:  Diagnosis Date   Barrett's esophagus - RESOLVED 2008   RESOLVED ON SUBSEQUENT EXAMS   Carotid artery dissection (HCC) 2011   L   Diverticulosis    GERD (gastroesophageal reflux disease)    Goiter    Hemorrhoids    Horner's syndrome 2012   Hyperlipidemia    Hypertension    Personal history of colonic adenomas 01/14/2008   Prostate cancer (HCC) 2010   Stroke (HCC) 1996   FROM DISSECTING R CAROID ARTERY    Past Surgical History:  Procedure Laterality Date   BIOPSY THYROID  2007   colloid  admixed with scant follicular epithelium   CAROTID ARTERY ANGIOPLASTY  2011   L   COLONOSCOPY     COLONOSCOPY W/ BIOPSIES AND POLYPECTOMY  multiple   adenomatous polyps, internal hemorrhoids, diverticulosis   CORNEAL TRANSPLANT Left 2015    corneal pannus & perforation,[facility]   DISSECTION OF CAROID ARTERY     WITH CVA   KNEE SURGERY     RIGHT..TO STRIGHTEN PATELLA   ROBOTIC PROSTATECTOMY  2011   Dr Crecencio Mc   UPPER GASTROINTESTINAL ENDOSCOPY   multiple   w/dilation, Barrett's , esophageal ring, hiatal hernia, gastritis   Patient Active Problem List   Diagnosis Date Noted   Pain in both knees 09/04/2022   Mid back pain on right side 05/18/2022   Obesity (BMI 30.0-34.9) 08/29/2021   Fibromuscular dysplasia (HCC) 09/14/2019   Umbilical hernia without obstruction or gangrene 09/14/2019   DOE (dyspnea on exertion) 09/14/2019   Subclinical hyperthyroidism 03/10/2018   Skin cancer 09/09/2017   Herpes zoster without complication 01/28/2017   Diabetes (HCC) 06/08/2015   H/O cornea transplant, left 05/30/2015   Nuclear sclerosis of both eyes 12/14/2014   Psoriasis 12/08/2014   Interstitial keratitis of left eye 06/18/2013   Corneal pannus 04/18/2013   Dissection of carotid artery (HCC) 01/19/2010   HORNER'S SYNDROME 01/04/2010   PROSTATE CANCER, HX OF 12/28/2008   Personal history of colonic adenomas 01/14/2008   HYPERLIPIDEMIA 03/03/2007   Essential hypertension 03/03/2007   GERD with stricture 03/03/2007   History of CVA (cerebrovascular accident) w/ resiual tinnitus 03/03/2007    PCP: Cheryll Cockayne  REFERRING PROVIDER: Richardean Sale  REFERRING DIAG:  M25.561,M25.562,G89.29 (ICD-10-CM) - Chronic pain of both knees  M17.0 (ICD-10-CM) - Bilateral primary osteoarthritis of knee    THERAPY DIAG:  Acute pain  of both knees  Difficulty navigating stairs  Rationale for Evaluation and Treatment: Rehabilitation  ONSET DATE: 10/28/22  SUBJECTIVE:   SUBJECTIVE STATEMENT:  Feeling good, pain is doing well. Golf went well, no increased pain. L knee has not given out for a long time   PERTINENT HISTORY: Patient is a 67 year old male complaining of bilat knee pain. Patient states patient was carrying chairs down the steps and legs got tangled with the legs of the chairs and fell down the last two steps. Patient knees feels very very tight. Patient states that the left knee the pain radiates from the back of the knee laterally  to the front. The right knee is located more so to the lateral right knee. Left knee hit the ground and not sure how the right knee hit because everything was tangle in the chairs. Patient used ice packs for the swelling. Patient states he was fine for a few days and progressively have gotten worse. This happened a week after mothers day. Pain is worse when going up or down steps or trying to bend over. Walking is Just uncomfortable    09/30/2022 Patient states he is better not a 100 percent but better than when he came in    10/28/2022 Patient states that he is better, not a 100 % but much bette  PAIN:  Are you having pain? Yes: NPRS scale: 1/10 Pain location: top of both knees Pain description: dull, ache Aggravating factors: stairs, closing recliner chairs with my legs Relieving factors: cortisone shot helped  PRECAUTIONS: None  RED FLAGS: None   WEIGHT BEARING RESTRICTIONS: No  FALLS:  Has patient fallen in last 6 months? Yes. Number of falls 1 fall on the stairs, missed the last step  LIVING ENVIRONMENT: Lives with: lives with their family Lives in: House/apartment Stairs: Yes: Internal: 14 steps; on right going up and External: 2 steps; on right going up  OCCUPATION: Retired  PLOF: Independent  PATIENT GOALS: have no pain  NEXT MD VISIT: 11/26/22  OBJECTIVE:   DIAGNOSTIC FINDINGS: LEFT KNEE IMPRESSION: 1. Mild patellofemoral compartment degenerative changes. Enthesopathic changes off the superior patella. 2. A soft tissue calcification adjacent to the distal medial femur is favored to be nonacute, likely from a nonacute MCL injury. Recommend clinical correlation. 3. No other abnormalities.  RIGHT KNEE IMPRESSION: Mild tricompartmental degenerative changes. Minimal medial compartment loss of joint space. Enthesopathic changes off the superior patella.    PATIENT SURVEYS:  FOTO 72; 12/03/22 81  COGNITION: Overall cognitive status: Within functional limits for tasks  assessed     SENSATION: WFL   MUSCLE LENGTH: Hamstrings: some tightness in BLE  POSTURE: rounded shoulders and forward head  PALPATION: N/A  LOWER EXTREMITY ROM: WFL    LOWER EXTREMITY MMT: grossly 5/5 BLE    FUNCTIONAL TESTS:  5 times sit to stand: 14.47s some pain in the knees; 9/10- 9 seconds   Timed up and go (TUG): 9.64s   GAIT: Distance walked: in clinic distances Assistive device utilized: None Level of assistance: Complete Independence    TODAY'S TREATMENT:  DATE:   12/03/22  FOTO, 5xSTS, stair assessment, goal review  Nustep x8 minutes L6 while PT finalized HEP BLEs only  Education on progress with PT and final HEP- ok to break it up into thirds or quarters and rotate thru for daily performance, or if he would like OK to perform all at one time but with a rest day in between. Encouraged regular cardio, educated that he will need new referral to return to PT if new pain arises      11/27/22  TherEx  Nustep L5x8 minutes BLEs only  Bridge + ABD into green TB x16 Sidelying hip ABD green TB x16 B  Quadruped bird dogs x10 B green TB Quadruped hip extensions x10 B green TB  Figure 4 stretch 2x30 seconds B Supine quad + hip flexor stretch 2x30 seconds B Forward step ups x15 B 6 inch step Forward step downs x15 B from 4 inch step   Wall slides x15  Tandem stance x3 rounds B blue foam pad  Tandem gait x4 rounds in // bars forward and backwards SLS x3 rounds B solid surface   11/19/22  TherEx  Nustep L5 x8 minutes BLEs only Bridges + ABD into green TB x12 Sidelying hip ABD with green TB x10 B Walking bridges x10 HS stretches 2x30 seconds B Figure 4 piriformis 2x30 seconds B  Forward step ups 6 inch step 1x10 B Wall squats x10 self selected depth cues for midline  Hip hikes x10 B Hip hikes + ABD x10 B Sideways step ups 6  inch step x10 B Forward step downs 4 inch step x10 B STS from mat table green TB around knees x10 Tandem stance 2x30 seconds B solid surface       11/13/22 NuStep L5 x28mins  Step ups 6" Lateral step ups 6"  Calf raises 2x10  Calf stretch 30s  HS curls 25# 2x10 Leg ext 10# 2x10 Resisted gait 30# 4 ways x4  Walking on beam STS 2x10 Leg press 20# 2x10    EVAL 11/05/22    PATIENT EDUCATION:  Education details: POC and HEP Person educated: Patient Education method: Explanation Education comprehension: verbalized understanding  HOME EXERCISE PROGRAM:  Access Code: [email or web address] URL: [email or web address] Date: 12/03/2022 Prepared by: Nedra Hai  Exercises - Sit to Stand  - 1 x daily - 7 x weekly - 2 sets - 10 reps - Seated Knee Extension with Resistance  - 1 x daily - 7 x weekly - 2 sets - 10 reps - Seated Knee Flexion with Anchored Resistance  - 1 x daily - 7 x weekly - 2 sets - 10 reps - Side Stepping with Resistance at Ankles  - 1 x daily - 7 x weekly - 2 sets - 10 reps - Seated Hamstring Stretch  - 1 x daily - 7 x weekly - 2 sets - 1 reps - 30 hold - Supine Bridge with Resistance Band  - 1 x daily - 7 x weekly - 2 sets - 10 reps - 3 seconds hold - Bridge Walk Out  - 1 x daily - 7 x weekly - 2 sets - 10 reps - Forward Step Up  - 1 x daily - 7 x weekly - 2 sets - 10 reps - Wall Squat  - 1 x daily - 7 x weekly - 2 sets - 10 reps - 2-3 seconds  hold - Quadruped Hip Abduction with Resistance Loop  - 1 x daily - 7 x weekly - 2 sets -  10 reps - 2 seconds  hold - Quadruped Leg Extension with Resistance  - 1 x daily - 7 x weekly - 2 sets - 10 reps - 1 second  hold - Supine Figure 4 Piriformis Stretch  - 1 x daily - 7 x weekly - 2 sets - 2 reps - 30 seconds  hold - Supine Quadriceps Stretch with Strap on Table  - 1 x daily - 7 x weekly - 2 sets - 2 reps - 30 seconds  hold - Tandem Stance in Corner  - 1 x daily - 7 x weekly - 1 sets - 6 reps - 15-30 seconds  hold -  Single Leg Stance  - 1 x daily - 7 x weekly - 1 sets - 6 reps - 15 seconds  hold  ASSESSMENT:  CLINICAL IMPRESSION:  Mr Bier arrives today doing well, he was able to play golf this weekend and pain levels in B knees have remained at minimal levels. He is able to perform all desired functional tasks well and feels ready for DC at this time, all goals met. Provided final HEP today and education as above. Thank you for the referral!   OBJECTIVE IMPAIRMENTS: pain.   ACTIVITY LIMITATIONS: stairs  REHAB POTENTIAL: Good  CLINICAL DECISION MAKING: Stable/uncomplicated  EVALUATION COMPLEXITY: Low   GOALS: Goals reviewed with patient? Yes  SHORT TERM GOALS: Target date: 12/03/22  Patient will be independent with initial HEP. Goal status: MET 12/03/22   LONG TERM GOALS: Target date: 01/07/23  Patient will be independent with advanced/ongoing HEP to improve outcomes and carryover.  Goal status: MET 12/03/22  2.  Patient will report at least 75% improvement in bilateral knee pain to improve QOL. Baseline: 4/10 Goal status: MET 12/03/22  3.  Patient will demonstrate STS from chair w/o pain <12s Baseline: 14.47s w/pain Goal status: MET 12/03/22   4. Patient will be able to ascend/descend stairs with reciprocal step pattern and no pain safely to access home and community.  Baseline: pain with stairs Goal status: MET 12/03/22   PLAN:  PT FREQUENCY: 1-2x/week  PT DURATION: 8 weeks  PLANNED INTERVENTIONS: Therapeutic exercises, Therapeutic activity, Neuromuscular re-education, Balance training, Gait training, Patient/Family education, Self Care, Joint mobilization, Stair training, Dry Needling, Electrical stimulation, Cryotherapy, Moist heat, Taping, Vasopneumatic device, Ionotophoresis 4mg /ml Dexamethasone, and Manual therapy  PLAN FOR NEXT SESSION: DC today   Nedra Hai, PT, DPT 12/03/22 3:06 PM

## 2022-12-19 ENCOUNTER — Ambulatory Visit: Payer: Medicare HMO

## 2022-12-19 VITALS — BP 120/82 | HR 71 | Ht 67.5 in | Wt 216.6 lb

## 2022-12-19 DIAGNOSIS — Z0000 Encounter for general adult medical examination without abnormal findings: Secondary | ICD-10-CM | POA: Diagnosis not present

## 2022-12-19 NOTE — Progress Notes (Addendum)
Subjective:   Eric Dillon is a 67 y.o. male who presents for an Initial Medicare Annual Wellness Visit.  Visit Complete: In person  Patient Medicare AWV questionnaire was completed by the patient on 12/18/2022; I have confirmed that all information answered by patient is correct and no changes since this date.  Cardiac Risk Factors include: advanced age (>76men, >57 women);male gender;dyslipidemia;diabetes mellitus;Other (see comment);obesity (BMI >30kg/m2), Risk factor comments: CVS     Objective:    Today's Vitals   12/19/22 1110  BP: 120/82  Pulse: 71  SpO2: 96%  Weight: 216 lb 9.6 oz (98.2 kg)  Height: 5' 7.5" (1.715 m)   Body mass index is 33.42 kg/m.     12/19/2022   11:15 AM 11/05/2022    3:10 PM  Advanced Directives  Does Patient Have a Medical Advance Directive? Yes Yes  Type of Estate agent of [place];Living will Healthcare Power of [place];Living will  Copy of Healthcare Power of Attorney in Chart? No - copy requested     Current Medications (verified) Outpatient Encounter Medications as of 12/19/2022  Medication Sig   aspirin EC 81 MG tablet Take 81 mg by mouth daily.   esomeprazole (NEXIUM) 40 MG capsule Take 1 capsule (40 mg total) by mouth daily before breakfast. (Patient taking differently: Take 20 mg by mouth daily. Take 2 tablets daily)   hydrochlorothiazide (MICROZIDE) 12.5 MG capsule TAKE 1 CAPSULE BY MOUTH EVERY DAY   meloxicam (MOBIC) 15 MG tablet Take 1 tablet (15 mg total) by mouth daily.   prednisoLONE acetate (PRED FORTE) 1 % ophthalmic suspension Place 1 drop into the left eye daily.   ramipril (ALTACE) 2.5 MG capsule Take 1 capsule (2.5 mg total) by mouth daily.   rosuvastatin (CRESTOR) 40 MG tablet Take 1 tablet (40 mg total) by mouth daily.   triamcinolone cream (KENALOG) 0.1 % Apply 1 application topically 2 (two) times daily.   valACYclovir (VALTREX) 1000 MG tablet Take 1,000 mg by mouth daily.   verapamil  (CALAN-SR) 240 MG CR tablet TAKE 1 TABLET BY MOUTH EVERY DAY   No facility-administered encounter medications on file as of 12/19/2022.    Allergies (verified) Amoxicillin, Codeine, Sulfonamide derivatives, Erythromycin, and Vitamin b50 complex [balanced b-100]   History: Past Medical History:  Diagnosis Date   Barrett's esophagus - RESOLVED 2008   RESOLVED ON SUBSEQUENT EXAMS   Carotid artery dissection (HCC) 2011   L   Diverticulosis    GERD (gastroesophageal reflux disease)    Goiter    Hemorrhoids    Horner's syndrome 2012   Hyperlipidemia    Hypertension    Personal history of colonic adenomas 01/14/2008   Prostate cancer (HCC) 2010   Stroke (HCC) 1996   FROM DISSECTING R CAROID ARTERY    Past Surgical History:  Procedure Laterality Date   BIOPSY THYROID  2007   colloid  admixed with scant follicular epithelium   CAROTID ARTERY ANGIOPLASTY  2011   L   COLONOSCOPY     COLONOSCOPY W/ BIOPSIES AND POLYPECTOMY  multiple   adenomatous polyps, internal hemorrhoids, diverticulosis   CORNEAL TRANSPLANT Left 2015    corneal pannus & perforation,[facility]   DISSECTION OF CAROID ARTERY     WITH CVA   KNEE SURGERY     RIGHT..TO STRIGHTEN PATELLA   ROBOTIC PROSTATECTOMY  2011   Dr Crecencio Mc   UPPER GASTROINTESTINAL ENDOSCOPY  multiple   w/dilation, Barrett's , esophageal ring, hiatal hernia, gastritis   Family  History  Problem Relation Age of Onset   Diabetes Mother    Stroke Father 29       CVA,BLOOD CLOT TO BRAIN   CAD Father        BYPASS   Stroke Paternal Grandmother 30       MULTIPLE CVA'S; onset late70s   Cancer Neg Hx    Colon cancer Neg Hx    Esophageal cancer Neg Hx    Rectal cancer Neg Hx    Stomach cancer Neg Hx    Social History   Socioeconomic History   Marital status: Married    Spouse name: Olegario Messier   Number of children: 2   Years of education: Not on file   Highest education level: Not on file  Occupational History   Occupation: Systems developer    Occupation: Retired  Tobacco Use   Smoking status: Former    Types: E-cigarettes    Quit date: 01/23/2010    Years since quitting: 12.9   Smokeless tobacco: Never   Tobacco comments:    smoked 1974-2011, up to 1 ppd  Vaping Use   Vaping status: Every Day  Substance and Sexual Activity   Alcohol use: Yes    Alcohol/week: 21.0 - 28.0 standard drinks of alcohol    Types: 21 - 28 Cans of beer per week   Drug use: No   Sexual activity: Not on file  Other Topics Concern   Not on file  Social History Narrative   Lives with wife.   Social Determinants of Health   Financial Resource Strain: Low Risk  (12/18/2022)   Overall Financial Resource Strain (CARDIA)    Difficulty of Paying Living Expenses: Not hard at all  Food Insecurity: No Food Insecurity (12/18/2022)   Hunger Vital Sign    Worried About Running Out of Food in the Last Year: Never true    Ran Out of Food in the Last Year: Never true  Transportation Needs: No Transportation Needs (12/18/2022)   PRAPARE - Administrator, Civil Service (Medical): No    Lack of Transportation (Non-Medical): No  Physical Activity: Insufficiently Active (12/18/2022)   Exercise Vital Sign    Days of Exercise per Week: 3 days    Minutes of Exercise per Session: 30 min  Stress: No Stress Concern Present (12/18/2022)   [employer] of Occupational Health - Occupational Stress Questionnaire    Feeling of Stress : Not at all  Social Connections: Unknown (12/18/2022)   Social Connection and Isolation Panel [NHANES]    Frequency of Communication with Friends and Family: Once a week    Frequency of Social Gatherings with Friends and Family: Once a week    Attends Religious Services: Not on Marketing executive or Organizations: No    Attends Banker Meetings: Never    Marital Status: Married    Tobacco Counseling Counseling given: Not Answered Tobacco comments: smoked 1974-2011, up to 1 ppd   Clinical  Intake:  Pre-visit preparation completed: Yes  Pain : No/denies pain     BMI - recorded: 33.42 Nutritional Risks: None Diabetes: Yes CBG done?: No Did pt. bring in CBG monitor from home?: No  How often do you need to have someone help you when you read instructions, pamphlets, or other written materials from your doctor or pharmacy?: 1 - Never  Interpreter Needed?: No  Information entered by :: Veverly Larimer, RMA   Activities of Daily Living    12/18/2022  1:01 PM  In your present state of health, do you have any difficulty performing the following activities:  Hearing? 0  Vision? 0  Difficulty concentrating or making decisions? 0  Walking or climbing stairs? 0  Dressing or bathing? 0  Doing errands, shopping? 0  Preparing Food and eating ? N  Using the Toilet? N  In the past six months, have you accidently leaked urine? Y  Do you have problems with loss of bowel control? N  Managing your Medications? N  Managing your Finances? N  Housekeeping or managing your Housekeeping? N    Patient Care Team: Pincus Sanes, MD as PCP - General (Internal Medicine) Holli Humbles, MD ([facility])  Indicate any recent Medical Services you may have received from other than [facility] providers in the past year (date may be approximate).     Assessment:   This is a routine wellness examination for [place].  Hearing/Vision screen Hearing Screening - Comments:: Denies hearing difficulties   Vision Screening - Comments:: Wears eyeglasses    Goals Addressed               This Visit's Progress     Patient Stated (pt-stated)        Not that he could think of      Depression Screen    12/19/2022   11:16 AM 05/17/2022    1:43 PM 02/28/2022   11:01 AM 02/28/2022   11:00 AM 08/29/2021    1:59 PM 06/28/2020    2:38 PM 09/11/2018    2:14 PM  PHQ 2/9 Scores  PHQ - 2 Score 0 0 0 0 0 0 0  PHQ- 9 Score 1  0        Fall Risk    12/18/2022    1:01 PM 05/17/2022     1:43 PM 02/28/2022   11:00 AM 08/29/2021    1:59 PM 03/15/2019    1:35 PM  Fall Risk   Falls in the past year? 1 0 0 0 0  Number falls in past yr: 0 0 0 0 0  Injury with Fall? 1 0 0 0   Risk for fall due to :  No Fall Risks No Fall Risks No Fall Risks   Follow up Falls evaluation completed;Falls prevention discussed Falls evaluation completed Falls evaluation completed Falls evaluation completed     MEDICARE RISK AT HOME: Medicare Risk at Home Any stairs in or around the home?: Yes If so, are there any without handrails?: No Home free of loose throw rugs in walkways, pet beds, electrical cords, etc?: Yes Adequate lighting in your home to reduce risk of falls?: Yes Life alert?: No Use of a cane, walker or w/c?: No Grab bars in the bathroom?: No Shower chair or bench in shower?: No Elevated toilet seat or a handicapped toilet?: No  TIMED UP AND GO:  Was the test performed? Yes  Length of time to ambulate 10 feet: 10 sec Gait steady and fast without use of assistive device    Cognitive Function:        12/19/2022   11:15 AM  6CIT Screen  What Year? 0 points  What month? 0 points  What time? 0 points  Count back from 20 0 points  Months in reverse 0 points  Repeat phrase 0 points  Total Score 0 points    Immunizations Immunization History  Administered Date(s) Administered   Influenza Whole 01/24/2010   Influenza, High Dose Seasonal PF 12/05/2021  Influenza, Seasonal, Injecte, Preservative Fre 04/01/2012   Influenza,inj,Quad PF,6+ Mos 12/13/2017, 12/08/2018, 12/05/2020   Influenza,inj,Quad PF,6-35 Mos 12/13/2017   Influenza-Unspecified 01/06/2013, 01/11/2014, 02/14/2016, 12/21/2016, 12/11/2019   PFIZER(Purple Top)SARS-COV-2 Vaccination 06/10/2019, 07/01/2019   PNEUMOCOCCAL CONJUGATE-20 03/01/2022   Pneumococcal Polysaccharide-23 06/26/2016   Tdap 05/30/2015   Zoster Recombinant(Shingrix) 10/29/2017, 12/29/2017    TDAP status: Up to date  Flu Vaccine status:  Due, Education has been provided regarding the importance of this vaccine. Advised may receive this vaccine at local pharmacy or Health Dept. Aware to provide a copy of the vaccination record if obtained from local pharmacy or Health Dept. Verbalized acceptance and understanding.  Pneumococcal vaccine status: Up to date  Covid-19 vaccine status: Declined, Education has been provided regarding the importance of this vaccine but patient still declined. Advised may receive this vaccine at local pharmacy or Health Dept.or vaccine clinic. Aware to provide a copy of the vaccination record if obtained from local pharmacy or Health Dept. Verbalized acceptance and understanding.  Qualifies for Shingles Vaccine? Yes   Zostavax completed Yes   Shingrix Completed?: Yes  Screening Tests Health Maintenance  Topic Date Due   COVID-19 Vaccine (3 - Pfizer risk series) 07/29/2019   INFLUENZA VACCINE  10/24/2022   Colonoscopy  02/07/2023   OPHTHALMOLOGY EXAM  01/29/2023 (Originally 11/22/2021)   FOOT EXAM  03/10/2023 (Originally 12/04/2022)   HEMOGLOBIN A1C  03/06/2023   Diabetic kidney evaluation - eGFR measurement  09/04/2023   Diabetic kidney evaluation - Urine ACR  09/04/2023   Medicare Annual Wellness (AWV)  12/19/2023   DTaP/Tdap/Td (2 - Td or Tdap) 05/29/2025   Pneumonia Vaccine 44+ Years old  Completed   Hepatitis C Screening  Completed   Zoster Vaccines- Shingrix  Completed   HPV VACCINES  Aged Out    Health Maintenance  Health Maintenance Due  Topic Date Due   COVID-19 Vaccine (3 - Pfizer risk series) 07/29/2019   INFLUENZA VACCINE  10/24/2022   Colonoscopy  02/07/2023    Colorectal cancer screening: Type of screening: Colonoscopy. Completed 02/06/2018. Repeat every 5 years  Lung Cancer Screening: (Low Dose CT Chest recommended if Age 49-80 years, 20 pack-year currently smoking OR have quit w/in 15years.) does not qualify.   Lung Cancer Screening Referral: N/A  Additional  Screening:  Hepatitis C Screening: does qualify; Completed 05/30/2015  Vision Screening: Recommended annual ophthalmology exams for early detection of glaucoma and other disorders of the eye. Is the patient up to date with their annual eye exam?  No  Who is the provider or what is the name of the office in which the patient attends annual eye exams?  Dr. Valere Dross [facility]) If pt is not established with a provider, would they like to be referred to a provider to establish care? No .   Dental Screening: Recommended annual dental exams for proper oral hygiene  Diabetic Foot Exam: Diabetic Foot Exam: Overdue, Pt has been advised about the importance in completing this exam. Pt is scheduled for diabetic foot exam on 03/10/2023.  Community Resource Referral / Chronic Care Management: CRR required this visit?  No   CCM required this visit?  No    Plan:     I have personally reviewed and noted the following in the patient's chart:   Medical and social history Use of alcohol, tobacco or illicit drugs  Current medications and supplements including opioid prescriptions. Patient is not currently taking opioid prescriptions. Functional ability and status Nutritional status Physical activity Advanced  directives List of other physicians Hospitalizations, surgeries, and ER visits in previous 12 months Vitals Screenings to include cognitive, depression, and falls Referrals and appointments  In addition, I have reviewed and discussed with patient certain preventive protocols, quality metrics, and best practice recommendations. A written personalized care plan for preventive services as well as general preventive health recommendations were provided to patient.     Roosevelt Eimers L Gabrian Hoque, CMA   12/19/2022   After Visit Summary: (MyChart) Due to this being a telephonic visit, the after visit summary with patients personalized plan was offered to patient via MyChart   Nurse Notes:  Patient is due for Flu and Covid vaccine, which patient stated that he will be getting soon.  He is due for a colonoscopy and has an appointment Nov 25th.   He also stated that he has an eye exam appointment Nov 6,2024.  Patient had no concerns to address today.

## 2022-12-19 NOTE — Patient Instructions (Signed)
Eric Dillon , Thank you for taking time to come for your Medicare Wellness Visit. I appreciate your ongoing commitment to your health goals. Please review the following plan we discussed and let me know if I can assist you in the future.   Referrals/Orders/Follow-Ups/Clinician Recommendations: You are due for your Flu and Covid vaccines.  It was nice to meet you today and hope you have a great rest of the year.  Aim for 30 minutes of exercise or brisk walking, 6-8 glasses of water, and 5 servings of fruits and vegetables each day.   This is a list of the screening recommended for you and due dates:  Health Maintenance  Topic Date Due   COVID-19 Vaccine (3 - Pfizer risk series) 07/29/2019   Flu Shot  10/24/2022   Colon Cancer Screening  02/07/2023   Eye exam for diabetics  01/29/2023*   Complete foot exam   03/10/2023*   Hemoglobin A1C  03/06/2023   Yearly kidney function blood test for diabetes  09/04/2023   Yearly kidney health urinalysis for diabetes  09/04/2023   Medicare Annual Wellness Visit  12/19/2023   DTaP/Tdap/Td vaccine (2 - Td or Tdap) 05/29/2025   Pneumonia Vaccine  Completed   Hepatitis C Screening  Completed   Zoster (Shingles) Vaccine  Completed   HPV Vaccine  Aged Out  *Topic was postponed. The date shown is not the original due date.    Advanced directives: (Copy Requested) Please bring a copy of your health care power of attorney and living will to the office to be added to your chart at your convenience.  Next Medicare Annual Wellness Visit scheduled for next year: Yes

## 2023-01-20 ENCOUNTER — Encounter: Payer: Self-pay | Admitting: Internal Medicine

## 2023-01-20 ENCOUNTER — Ambulatory Visit (AMBULATORY_SURGERY_CENTER): Payer: Medicare HMO

## 2023-01-20 VITALS — Ht 67.5 in | Wt 215.0 lb

## 2023-01-20 DIAGNOSIS — Z860100 Personal history of colon polyps, unspecified: Secondary | ICD-10-CM

## 2023-01-20 NOTE — Progress Notes (Signed)

## 2023-01-29 DIAGNOSIS — H2513 Age-related nuclear cataract, bilateral: Secondary | ICD-10-CM | POA: Diagnosis not present

## 2023-01-29 DIAGNOSIS — H5213 Myopia, bilateral: Secondary | ICD-10-CM | POA: Diagnosis not present

## 2023-01-29 DIAGNOSIS — Z947 Corneal transplant status: Secondary | ICD-10-CM | POA: Diagnosis not present

## 2023-01-29 DIAGNOSIS — H52203 Unspecified astigmatism, bilateral: Secondary | ICD-10-CM | POA: Diagnosis not present

## 2023-01-29 DIAGNOSIS — H524 Presbyopia: Secondary | ICD-10-CM | POA: Diagnosis not present

## 2023-01-29 LAB — HM DIABETES EYE EXAM

## 2023-02-16 ENCOUNTER — Other Ambulatory Visit: Payer: Self-pay | Admitting: Internal Medicine

## 2023-02-16 NOTE — Progress Notes (Unsigned)
[facility] History and Physical   Primary Care Physician:  Pincus Sanes, MD   Reason for Procedure:   Hx adenomatous colon polyps  Plan:    colonoscopy     HPI: Eric Dillon is a 67 y.o. male s/p removal of adenomatous colon polyps in 2009, 2014 and 2019.   Past Medical History:  Diagnosis Date   Barrett's esophagus - RESOLVED 2008   RESOLVED ON SUBSEQUENT EXAMS   Carotid artery dissection (HCC) 2011   L   Diverticulosis    GERD (gastroesophageal reflux disease)    Goiter    Hemorrhoids    Horner's syndrome 2012   Hyperlipidemia    Hypertension    Personal history of colonic adenomas 01/14/2008   Prostate cancer (HCC) 2010   Stroke (HCC) 1996   FROM DISSECTING R CAROID ARTERY     Past Surgical History:  Procedure Laterality Date   BIOPSY THYROID  2007   colloid  admixed with scant follicular epithelium   CAROTID ARTERY ANGIOPLASTY  2011   L   COLONOSCOPY     COLONOSCOPY W/ BIOPSIES AND POLYPECTOMY  multiple   adenomatous polyps, internal hemorrhoids, diverticulosis   CORNEAL TRANSPLANT Left 2015    corneal pannus & perforation,[facility]   DISSECTION OF CAROID ARTERY     WITH CVA   KNEE SURGERY     RIGHT..TO STRIGHTEN PATELLA   ROBOTIC PROSTATECTOMY  2011   Dr Crecencio Mc   UPPER GASTROINTESTINAL ENDOSCOPY  multiple   w/dilation, Barrett's , esophageal ring, hiatal hernia, gastritis    Prior to Admission medications   Medication Sig Start Date End Date Taking? Authorizing Provider  aspirin EC 81 MG tablet Take 81 mg by mouth daily.    [provider]  esomeprazole (NEXIUM) 40 MG capsule Take 1 capsule (40 mg total) by mouth daily before breakfast. Patient taking differently: Take 20 mg by mouth daily. Take 2 tablets daily 03/15/15   Iva Boop, MD  hydrochlorothiazide (MICROZIDE) 12.5 MG capsule TAKE 1 CAPSULE BY MOUTH EVERY DAY 11/21/22   Pincus Sanes, MD  meloxicam (MOBIC) 15 MG tablet Take 1 tablet (15 mg total) by mouth  daily. 09/05/22   Richardean Sale, DO  prednisoLONE acetate (PRED FORTE) 1 % ophthalmic suspension Place 1 drop into the left eye daily. 01/29/21   [provider]  ramipril (ALTACE) 2.5 MG capsule Take 1 capsule (2.5 mg total) by mouth daily. 02/28/22   Pincus Sanes, MD  rosuvastatin (CRESTOR) 40 MG tablet Take 1 tablet (40 mg total) by mouth daily. 02/28/22   Pincus Sanes, MD  triamcinolone cream (KENALOG) 0.1 % Apply 1 application topically 2 (two) times daily. 09/11/18   Pincus Sanes, MD  valACYclovir (VALTREX) 1000 MG tablet Take 1,000 mg by mouth daily.    [provider]  verapamil (CALAN-SR) 240 MG CR tablet TAKE 1 TABLET BY MOUTH EVERY DAY 11/21/22   Pincus Sanes, MD    Current Outpatient Medications  Medication Sig Dispense Refill   aspirin EC 81 MG tablet Take 81 mg by mouth daily.     esomeprazole (NEXIUM) 40 MG capsule Take 1 capsule (40 mg total) by mouth daily before breakfast. (Patient taking differently: Take 20 mg by mouth daily. Take 2 tablets daily) 90 capsule 3   hydrochlorothiazide (MICROZIDE) 12.5 MG capsule TAKE 1 CAPSULE BY MOUTH EVERY DAY 90 capsule 1   prednisoLONE acetate (PRED FORTE) 1 % ophthalmic suspension Place 1 drop into the  left eye daily.     ramipril (ALTACE) 2.5 MG capsule TAKE 1 CAPSULE BY MOUTH EVERY DAY 90 capsule 3   rosuvastatin (CRESTOR) 40 MG tablet TAKE 1 TABLET BY MOUTH EVERY DAY 90 tablet 3   valACYclovir (VALTREX) 1000 MG tablet Take 1,000 mg by mouth daily.     verapamil (CALAN-SR) 240 MG CR tablet TAKE 1 TABLET BY MOUTH EVERY DAY 90 tablet 2   meloxicam (MOBIC) 15 MG tablet Take 1 tablet (15 mg total) by mouth daily. (Patient not taking: Reported on 02/17/2023) 30 tablet 0   triamcinolone cream (KENALOG) 0.1 % Apply 1 application topically 2 (two) times daily. (Patient not taking: Reported on 02/17/2023) 30 g 0   Current Facility-Administered Medications  Medication Dose Route Frequency Provider Last Rate Last Admin    0.9 %  sodium chloride infusion  500 mL Intravenous Once Iva Boop, MD        Allergies as of 02/17/2023 - Review Complete 02/17/2023  Allergen Reaction Noted   Amoxicillin  05/01/2006   Codeine  01/19/2010   Sulfonamide derivatives  01/19/2010   Erythromycin  01/19/2010   Vitamin b50 complex [balanced b-100]  03/15/2019    Family History  Problem Relation Age of Onset   Diabetes Mother    Stroke Father 27       CVA,BLOOD CLOT TO BRAIN   CAD Father        BYPASS   Stroke Paternal Grandmother 57       MULTIPLE CVA'S; onset late70s   Cancer Neg Hx    Colon cancer Neg Hx    Esophageal cancer Neg Hx    Rectal cancer Neg Hx    Stomach cancer Neg Hx     Social History   Socioeconomic History   Marital status: Married    Spouse name: Eric Dillon   Number of children: 2   Years of education: Not on file   Highest education level: Not on file  Occupational History   Occupation: Systems developer   Occupation: Retired  Tobacco Use   Smoking status: Former    Types: E-cigarettes    Quit date: 01/23/2010    Years since quitting: 13.0   Smokeless tobacco: Never   Tobacco comments:    smoked 1974-2011, up to 1 ppd  Vaping Use   Vaping status: Every Day  Substance and Sexual Activity   Alcohol use: Yes    Alcohol/week: 21.0 - 28.0 standard drinks of alcohol    Types: 21 - 28 Cans of beer per week   Drug use: No   Sexual activity: Not on file  Other Topics Concern   Not on file  Social History Narrative   Lives with wife.   Social Determinants of Health   Financial Resource Strain: Low Risk  (12/18/2022)   Overall Financial Resource Strain (CARDIA)    Difficulty of Paying Living Expenses: Not hard at all  Food Insecurity: No Food Insecurity (12/18/2022)   Hunger Vital Sign    Worried About Running Out of Food in the Last Year: Never true    Ran Out of Food in the Last Year: Never true  Transportation Needs: No Transportation Needs (12/18/2022)   PRAPARE - Therapist, art (Medical): No    Lack of Transportation (Non-Medical): No  Physical Activity: Insufficiently Active (12/18/2022)   Exercise Vital Sign    Days of Exercise per Week: 3 days    Minutes of Exercise per Session: 30 min  Stress: No Stress Concern Present (12/18/2022)   [employer] of Occupational Health - Occupational Stress Questionnaire    Feeling of Stress : Not at all  Social Connections: Unknown (12/18/2022)   Social Connection and Isolation Panel [NHANES]    Frequency of Communication with Friends and Family: Once a week    Frequency of Social Gatherings with Friends and Family: Once a week    Attends Religious Services: Not on Marketing executive or Organizations: No    Attends Banker Meetings: Never    Marital Status: Married  Catering manager Violence: Not At Risk (12/19/2022)   Humiliation, Afraid, Rape, and Kick questionnaire    Fear of Current or Ex-Partner: No    Emotionally Abused: No    Physically Abused: No    Sexually Abused: No    Review of Systems:  All other review of systems negative except as mentioned in the HPI.  Physical Exam: Vital signs BP (!) 152/92   Pulse 65   Temp (!) 97.4 F (36.3 C) (Skin)   Ht 5' 7.5" (1.715 m)   Wt 215 lb (97.5 kg)   SpO2 97%   BMI 33.18 kg/m   General:   Alert,  Well-developed, well-nourished, pleasant and cooperative in NAD Lungs:  Clear throughout to auscultation.   Heart:  Regular rate and rhythm; no murmurs, clicks, rubs,  or gallops. Abdomen:  Soft, nontender and nondistended. Normal bowel sounds.  Small soft umbilical hernia Neuro/Psych:  Alert and cooperative. Normal mood and affect. A and O x 3   @Kyndal Heringer  Sena Slate, MD, [facility] [phone] (pager) 02/17/2023 9:52 AM@

## 2023-02-17 ENCOUNTER — Ambulatory Visit: Payer: Medicare HMO | Admitting: Internal Medicine

## 2023-02-17 ENCOUNTER — Encounter: Payer: Self-pay | Admitting: Internal Medicine

## 2023-02-17 VITALS — BP 112/67 | HR 56 | Temp 97.4°F | Resp 20 | Ht 67.5 in | Wt 215.0 lb

## 2023-02-17 DIAGNOSIS — K5730 Diverticulosis of large intestine without perforation or abscess without bleeding: Secondary | ICD-10-CM | POA: Diagnosis not present

## 2023-02-17 DIAGNOSIS — K648 Other hemorrhoids: Secondary | ICD-10-CM

## 2023-02-17 DIAGNOSIS — D125 Benign neoplasm of sigmoid colon: Secondary | ICD-10-CM

## 2023-02-17 DIAGNOSIS — Z9079 Acquired absence of other genital organ(s): Secondary | ICD-10-CM

## 2023-02-17 DIAGNOSIS — Z860101 Personal history of adenomatous and serrated colon polyps: Secondary | ICD-10-CM | POA: Diagnosis not present

## 2023-02-17 DIAGNOSIS — Z1211 Encounter for screening for malignant neoplasm of colon: Secondary | ICD-10-CM | POA: Diagnosis not present

## 2023-02-17 DIAGNOSIS — I10 Essential (primary) hypertension: Secondary | ICD-10-CM | POA: Diagnosis not present

## 2023-02-17 DIAGNOSIS — Z860100 Personal history of colon polyps, unspecified: Secondary | ICD-10-CM

## 2023-02-17 DIAGNOSIS — E785 Hyperlipidemia, unspecified: Secondary | ICD-10-CM | POA: Diagnosis not present

## 2023-02-17 MED ORDER — SODIUM CHLORIDE 0.9 % IV SOLN: 500.0000 mL | Freq: Once | INTRAVENOUS | Status: DC

## 2023-02-17 NOTE — Patient Instructions (Addendum)
There was another tiny polyp removed. There was diverticulosis and hemorrhoids were swollen.  I will let you know pathology results and when to have another routine colonoscopy by mail and/or My Chart. May be able to go longer per new guidelines.  I appreciate the opportunity to care for you. Iva Boop, MD, FACG   YOU HAD AN ENDOSCOPIC PROCEDURE TODAY AT THE  ENDOSCOPY CENTER:   Refer to the procedure report that was given to you for any specific questions about what was found during the examination.  If the procedure report does not answer your questions, please call your gastroenterologist to clarify.  If you requested that your care partner not be given the details of your procedure findings, then the procedure report has been included in a sealed envelope for you to review at your convenience later.  YOU SHOULD EXPECT: Some feelings of bloating in the abdomen. Passage of more gas than usual.  Walking can help get rid of the air that was put into your GI tract during the procedure and reduce the bloating. If you had a lower endoscopy (such as a colonoscopy or flexible sigmoidoscopy) you may notice spotting of blood in your stool or on the toilet paper. If you underwent a bowel prep for your procedure, you may not have a normal bowel movement for a few days.  Please Note:  You might notice some irritation and congestion in your nose or some drainage.  This is from the oxygen used during your procedure.  There is no need for concern and it should clear up in a day or so.  SYMPTOMS TO REPORT IMMEDIATELY:  Following lower endoscopy (colonoscopy or flexible sigmoidoscopy):  Excessive amounts of blood in the stool  Significant tenderness or worsening of abdominal pains  Swelling of the abdomen that is new, acute  Fever of 100F or higher   For urgent or emergent issues, a gastroenterologist can be reached at any hour by calling [phone]. Do not use MyChart messaging for  urgent concerns.    DIET:  We do recommend a small meal at first, but then you may proceed to your regular diet.  Drink plenty of fluids but you should avoid alcoholic beverages for 24 hours.  ACTIVITY:  You should plan to take it easy for the rest of today and you should NOT DRIVE or use heavy machinery until tomorrow (because of the sedation medicines used during the test).    FOLLOW UP: Our staff will call the number listed on your records the next business day following your procedure.  We will call around 7:15- 8:00 am to check on you and address any questions or concerns that you may have regarding the information given to you following your procedure. If we do not reach you, we will leave a message.     If any biopsies were taken you will be contacted by phone or by letter within the next 1-3 weeks.  Please call us at [phone] if you have not heard about the biopsies in 3 weeks.    SIGNATURES/CONFIDENTIALITY: You and/or your care partner have signed paperwork which will be entered into your electronic medical record.  These signatures attest to the fact that that the information above on your After Visit Summary has been reviewed and is understood.  Full responsibility of the confidentiality of this discharge information lies with you and/or your care-partner.

## 2023-02-17 NOTE — Op Note (Signed)
[facility] Patient Name: Eric Dillon Procedure Date: 02/17/2023 9:55 AM MRN: [id number] Endoscopist: Iva Boop , MD, [id number] Age: 67 Referring MD:  Date of Birth: 12/25/1955 Gender: Male Account #: [id number] Procedure:                Colonoscopy Indications:              Surveillance: Personal history of adenomatous                            polyps on last colonoscopy 5 years ago, Last                            colonoscopy: November 2019 Medicines:                Monitored Anesthesia Care Procedure:                Pre-Anesthesia Assessment:                           - Prior to the procedure, a History and Physical                            was performed, and patient medications and                            allergies were reviewed. The patient's tolerance of                            previous anesthesia was also reviewed. The risks                            and benefits of the procedure and the sedation                            options and risks were discussed with the patient.                            All questions were answered, and informed consent                            was obtained. Prior Anticoagulants: The patient has                            taken no anticoagulant or antiplatelet agents. ASA                            Grade Assessment: II - A patient with mild systemic                            disease. After reviewing the risks and benefits,                            the patient was deemed in satisfactory condition to  undergo the procedure.                           After obtaining informed consent, the colonoscope                            was passed under direct vision. Throughout the                            procedure, the patient's blood pressure, pulse, and                            oxygen saturations were monitored continuously. The                            Olympus Scope SN: [id number] was introduced  through                            the anus and advanced to the the terminal ileum,                            with identification of the appendiceal orifice and                            IC valve. The colonoscopy was performed without                            difficulty. The patient tolerated the procedure                            well. The quality of the bowel preparation was                            adequate. The ileocecal valve, appendiceal orifice,                            and rectum were photographed. The bowel preparation                            used was Miralax via split dose instruction. Scope In: 10:02:50 AM Scope Out: 10:15:33 AM Scope Withdrawal Time: 0 hours 10 minutes 46 seconds  Total Procedure Duration: 0 hours 12 minutes 43 seconds  Findings:                 The digital exam findings include surgically absent                            prostate.                           A 3 mm polyp was found in the proximal sigmoid                            colon. The polyp was sessile. The polyp was removed  with a cold snare. Resection and retrieval were                            complete. Verification of patient identification                            for the specimen was done. Estimated blood loss was                            minimal.                           Multiple diverticula were found in the sigmoid                            colon.                           Internal hemorrhoids were found.                           The exam was otherwise without abnormality on                            direct and retroflexion views. Complications:            No immediate complications. Estimated Blood Loss:     Estimated blood loss was minimal. Impression:               - A surgically absent prostate found on digital                            exam.                           - One 3 mm polyp in the proximal sigmoid colon,                             removed with a cold snare. Resected and retrieved.                           - Diverticulosis in the sigmoid colon.                           - Internal hemorrhoids.                           - The examination was otherwise normal on direct                            and retroflexion views. terminal ileum also seen                            and was normal.                           - Personal history of colonic polyps.  12/2007 - 2                            adenomas                           09/16/2012 - 2 diminutive transverse polyps -                            adenomas                           02/06/2018 diminutive transverse colon polyp -                            adenoma Recommendation:           - Patient has a contact number available for                            emergencies. The signs and symptoms of potential                            delayed complications were discussed with the                            patient. Return to normal activities tomorrow.                            Written discharge instructions were provided to the                            patient.                           - Resume previous diet.                           - Continue present medications.                           - Repeat colonoscopy is recommended for                            surveillance. The colonoscopy date will be                            determined after pathology results from today's                            exam become available for review. Iva Boop, MD 02/17/2023 10:26:46 AM This report has been signed electronically.

## 2023-02-17 NOTE — Progress Notes (Signed)
Vss nad trans to pacu 

## 2023-02-17 NOTE — Progress Notes (Signed)
Pt's states no medical or surgical changes since previsit or office visit. 

## 2023-02-17 NOTE — Progress Notes (Signed)
Called to room to assist during endoscopic procedure.  Patient ID and intended procedure confirmed with present staff. Received instructions for my participation in the procedure from the performing physician.  

## 2023-02-18 ENCOUNTER — Telehealth: Payer: Self-pay | Admitting: *Deleted

## 2023-02-18 NOTE — Telephone Encounter (Signed)
  Follow up Call-     02/17/2023    9:40 AM  Call back number  Post procedure Call Back phone  # [phone]  Permission to leave phone message Yes    Post procedure follow up phone call. No answer at number given.  Left message on voicemail.

## 2023-02-21 LAB — SURGICAL PATHOLOGY

## 2023-02-26 ENCOUNTER — Encounter: Payer: Self-pay | Admitting: Internal Medicine

## 2023-02-26 NOTE — Progress Notes (Signed)
Care team updated and abstracted results.

## 2023-02-27 ENCOUNTER — Encounter: Payer: Self-pay | Admitting: Internal Medicine

## 2023-02-27 DIAGNOSIS — Z860100 Personal history of colon polyps, unspecified: Secondary | ICD-10-CM

## 2023-02-28 NOTE — Progress Notes (Unsigned)
    [name] D.[name] [address] [place] [address] Phone: [phone]   Assessment and Plan:     There are no diagnoses linked to this encounter.  ***   Pertinent previous records reviewed include ***    Follow Up: ***     Subjective:   I, Maida Widger, am serving as a Neurosurgeon for Doctor Richardean Sale   Chief Complaint: bilat knees   HPI:    09/05/22 Patient is a 67 year old male complaining of bilat knee pain. Patient states patient was carrying chairs down the steps and legs got tangled with the legs of the chairs and fell down the last two steps. Patient knees feels very very tight. Patient states that the left knee the pain radiates from the back of the knee laterally to the front. The right knee is located more so to the lateral right knee. Left knee hit the ground and not sure how the right knee hit because everything was tangle in the chairs. Patient used ice packs for the swelling. Patient states he was fine for a few days and progressively have gotten worse. This happened a week after mothers day. Pain is worse when going up or down steps or trying to bend over. Walking is Just uncomfortable     09/30/2022 Patient states he is better not a 100 percent but better than when he came in    10/28/2022 Patient states that he is better , not a 100 % but much better    11/26/2022 Patient states that his knee pain has improved. Slight pain with stairs but is able to use reciprocal gait.   03/03/2023 Patient states   Relevant Historical Information: GERD, hypertension, DM type II, history of  Additional pertinent review of systems negative.   Current Outpatient Medications:    aspirin EC 81 MG tablet, Take 81 mg by mouth daily., Disp: , Rfl:    esomeprazole (NEXIUM) 40 MG capsule, Take 1 capsule (40 mg total) by mouth daily before breakfast. (Patient taking differently: Take 20 mg by mouth daily. Take 2 tablets daily),  Disp: 90 capsule, Rfl: 3   hydrochlorothiazide (MICROZIDE) 12.5 MG capsule, TAKE 1 CAPSULE BY MOUTH EVERY DAY, Disp: 90 capsule, Rfl: 1   meloxicam (MOBIC) 15 MG tablet, Take 1 tablet (15 mg total) by mouth daily. (Patient not taking: Reported on 02/17/2023), Disp: 30 tablet, Rfl: 0   prednisoLONE acetate (PRED FORTE) 1 % ophthalmic suspension, Place 1 drop into the left eye daily., Disp: , Rfl:    ramipril (ALTACE) 2.5 MG capsule, TAKE 1 CAPSULE BY MOUTH EVERY DAY, Disp: 90 capsule, Rfl: 3   rosuvastatin (CRESTOR) 40 MG tablet, TAKE 1 TABLET BY MOUTH EVERY DAY, Disp: 90 tablet, Rfl: 3   triamcinolone cream (KENALOG) 0.1 %, Apply 1 application topically 2 (two) times daily. (Patient not taking: Reported on 02/17/2023), Disp: 30 g, Rfl: 0   valACYclovir (VALTREX) 1000 MG tablet, Take 1,000 mg by mouth daily., Disp: , Rfl:    verapamil (CALAN-SR) 240 MG CR tablet, TAKE 1 TABLET BY MOUTH EVERY DAY, Disp: 90 tablet, Rfl: 2   Objective:     There were no vitals filed for this visit.    There is no height or weight on file to calculate BMI.    Physical Exam:    ***   Electronically signed by:  [name] D[name] 7:37 AM 02/28/23

## 2023-03-03 ENCOUNTER — Ambulatory Visit: Payer: Medicare HMO | Admitting: Sports Medicine

## 2023-03-03 VITALS — BP 132/84 | HR 78 | Ht 67.0 in | Wt 224.0 lb

## 2023-03-03 DIAGNOSIS — M25561 Pain in right knee: Secondary | ICD-10-CM

## 2023-03-03 DIAGNOSIS — M170 Bilateral primary osteoarthritis of knee: Secondary | ICD-10-CM

## 2023-03-03 DIAGNOSIS — G8929 Other chronic pain: Secondary | ICD-10-CM

## 2023-03-03 DIAGNOSIS — M25562 Pain in left knee: Secondary | ICD-10-CM

## 2023-03-03 MED ORDER — CELECOXIB 200 MG PO CAPS: 200.0000 mg | ORAL_CAPSULE | Freq: Two times a day (BID) | ORAL | 0 refills | Status: DC

## 2023-03-03 NOTE — Patient Instructions (Addendum)
-   Start Celebrex 200 mg daily 2 times a day for x2 weeks.  If still having pain after 2 weeks, complete 3rd-week of NSAID. May use remaining NSAID as needed once daily for pain control.  Do not to use additional over-the-counter NSAIDs (ibuprofen, naproxen, Advil, Aleve) while taking prescription NSAIDs.  May use Tylenol [phone] mg 2 to 3 times a day for breakthrough pain. Zilretta prior Berkley Harvey  We will call you to schedule

## 2023-03-07 NOTE — Progress Notes (Unsigned)
Eric Dillon D.[facility] [address] [place] [address] Phone: [phone]   Assessment and Plan:     There are no diagnoses linked to this encounter.  ***   Pertinent previous records reviewed include ***    Follow Up: ***     Subjective:   I, Eric Dillon, am serving as a Neurosurgeon for Doctor Richardean Sale   Chief Complaint: bilat knees   HPI:    09/05/22 Patient is a 67 year old male complaining of bilat knee pain. Patient states patient was carrying chairs down the steps and legs got tangled with the legs of the chairs and fell down the last two steps. Patient knees feels very very tight. Patient states that the left knee the pain radiates from the back of the knee laterally to the front. The right knee is located more so to the lateral right knee. Left knee hit the ground and not sure how the right knee hit because everything was tangle in the chairs. Patient used ice packs for the swelling. Patient states he was fine for a few days and progressively have gotten worse. This happened a week after mothers day. Pain is worse when going up or down steps or trying to bend over. Walking is Just uncomfortable     09/30/2022 Patient states he is better not a 100 percent but better than when he came in    10/28/2022 Patient states that he is better , not a 100 % but much better    11/26/2022 Patient states that his knee pain has improved. Slight pain with stairs but is able to use reciprocal gait.    03/03/2023 Patient states his pain returned about a month ago right is worse than left . Now endorses a snap crackle pop that is painful  03/10/2023 Patient states   Relevant Historical Information: GERD, hypertension, DM type II, history of  Additional pertinent review of systems negative.   Current Outpatient Medications:    aspirin EC 81 MG tablet, Take 81 mg by mouth daily., Disp: , Rfl:    celecoxib (CELEBREX) 200 MG capsule,  Take 1 capsule (200 mg total) by mouth 2 (two) times daily., Disp: 60 capsule, Rfl: 0   esomeprazole (NEXIUM) 40 MG capsule, Take 1 capsule (40 mg total) by mouth daily before breakfast. (Patient taking differently: Take 20 mg by mouth daily. Take 2 tablets daily), Disp: 90 capsule, Rfl: 3   hydrochlorothiazide (MICROZIDE) 12.5 MG capsule, TAKE 1 CAPSULE BY MOUTH EVERY DAY, Disp: 90 capsule, Rfl: 1   meloxicam (MOBIC) 15 MG tablet, Take 1 tablet (15 mg total) by mouth daily., Disp: 30 tablet, Rfl: 0   prednisoLONE acetate (PRED FORTE) 1 % ophthalmic suspension, Place 1 drop into the left eye daily., Disp: , Rfl:    ramipril (ALTACE) 2.5 MG capsule, TAKE 1 CAPSULE BY MOUTH EVERY DAY, Disp: 90 capsule, Rfl: 3   rosuvastatin (CRESTOR) 40 MG tablet, TAKE 1 TABLET BY MOUTH EVERY DAY, Disp: 90 tablet, Rfl: 3   triamcinolone cream (KENALOG) 0.1 %, Apply 1 application topically 2 (two) times daily., Disp: 30 g, Rfl: 0   valACYclovir (VALTREX) 1000 MG tablet, Take 1,000 mg by mouth daily., Disp: , Rfl:    verapamil (CALAN-SR) 240 MG CR tablet, TAKE 1 TABLET BY MOUTH EVERY DAY, Disp: 90 tablet, Rfl: 2   Objective:     There were no vitals filed for this visit.    There is no height  or weight on file to calculate BMI.    Physical Exam:    ***   Electronically signed by:  Eric Dillon D.[facility] 7:32 AM 03/07/23

## 2023-03-09 ENCOUNTER — Encounter: Payer: Self-pay | Admitting: Internal Medicine

## 2023-03-09 NOTE — Patient Instructions (Addendum)
Blood work was ordered.       Medications changes include :   None    A referral was ordered and someone will call you to schedule an appointment.     Return in about 6 months (around 09/08/2023) for follow up.   Health Maintenance, Male Adopting a healthy lifestyle and getting preventive care are important in promoting health and wellness. Ask your health care provider about: The right schedule for you to have regular tests and exams. Things you can do on your own to prevent diseases and keep yourself healthy. What should I know about diet, weight, and exercise? Eat a healthy diet  Eat a diet that includes plenty of vegetables, fruits, low-fat dairy products, and lean protein. Do not eat a lot of foods that are high in solid fats, added sugars, or sodium. Maintain a healthy weight Body mass index (BMI) is a measurement that can be used to identify possible weight problems. It estimates body fat based on height and weight. Your health care provider can help determine your BMI and help you achieve or maintain a healthy weight. Get regular exercise Get regular exercise. This is one of the most important things you can do for your health. Most adults should: Exercise for at least 150 minutes each week. The exercise should increase your heart rate and make you sweat (moderate-intensity exercise). Do strengthening exercises at least twice a week. This is in addition to the moderate-intensity exercise. Spend less time sitting. Even light physical activity can be beneficial. Watch cholesterol and blood lipids Have your blood tested for lipids and cholesterol at 66 years of age, then have this test every 5 years. You may need to have your cholesterol levels checked more often if: Your lipid or cholesterol levels are high. You are older than 66 years of age. You are at high risk for heart disease. What should I know about cancer screening? Many types of cancers can be detected  early and may often be prevented. Depending on your health history and family history, you may need to have cancer screening at various ages. This may include screening for: Colorectal cancer. Prostate cancer. Skin cancer. Lung cancer. What should I know about heart disease, diabetes, and high blood pressure? Blood pressure and heart disease High blood pressure causes heart disease and increases the risk of stroke. This is more likely to develop in people who have high blood pressure readings or are overweight. Talk with your health care provider about your target blood pressure readings. Have your blood pressure checked: Every 3-5 years if you are 15-40 years of age. Every year if you are 7 years old or older. If you are between the ages of 55 and 79 and are a current or former smoker, ask your health care provider if you should have a one-time screening for abdominal aortic aneurysm (AAA). Diabetes Have regular diabetes screenings. This checks your fasting blood sugar level. Have the screening done: Once every three years after age 52 if you are at a normal weight and have a low risk for diabetes. More often and at a younger age if you are overweight or have a high risk for diabetes. What should I know about preventing infection? Hepatitis B If you have a higher risk for hepatitis B, you should be screened for this virus. Talk with your health care provider to find out if you are at risk for hepatitis B infection. Hepatitis C Blood testing is recommended for:  Everyone born from [birth year] through 1964. Anyone with known risk factors for hepatitis C. Sexually transmitted infections (STIs) You should be screened each year for STIs, including gonorrhea and chlamydia, if: You are sexually active and are younger than 66 years of age. You are older than 66 years of age and your health care provider tells you that you are at risk for this type of infection. Your sexual activity has changed since  you were last screened, and you are at increased risk for chlamydia or gonorrhea. Ask your health care provider if you are at risk. Ask your health care provider about whether you are at high risk for HIV. Your health care provider may recommend a prescription medicine to help prevent HIV infection. If you choose to take medicine to prevent HIV, you should first get tested for HIV. You should then be tested every 3 months for as long as you are taking the medicine. Follow these instructions at home: Alcohol use Do not drink alcohol if your health care provider tells you not to drink. If you drink alcohol: Limit how much you have to 0-2 drinks a day. Know how much alcohol is in your drink. In the [place] one drink equals one 12 oz bottle of beer (355 mL), one 5 oz glass of wine (148 mL), or one 1 oz glass of hard liquor (44 mL). Lifestyle Do not use any products that contain nicotine or tobacco. These products include cigarettes, chewing tobacco, and vaping devices, such as e-cigarettes. If you need help quitting, ask your health care provider. Do not use street drugs. Do not share needles. Ask your health care provider for help if you need support or information about quitting drugs. General instructions Schedule regular health, dental, and eye exams. Stay current with your vaccines. Tell your health care provider if: You often feel depressed. You have ever been abused or do not feel safe at home. Summary Adopting a healthy lifestyle and getting preventive care are important in promoting health and wellness. Follow your health care provider's instructions about healthy diet, exercising, and getting tested or screened for diseases. Follow your health care provider's instructions on monitoring your cholesterol and blood pressure. This information is not intended to replace advice given to you by your health care provider. Make sure you discuss any questions you have with your health care  provider. Document Revised: 07/31/2020 Document Reviewed: 07/31/2020 Elsevier Patient Education  2024 [employer].

## 2023-03-09 NOTE — Progress Notes (Unsigned)
Subjective:    Patient ID: Eric Dillon, male    DOB: Feb 07, 1956, 67 y.o.   MRN: [id number]     HPI Eric Dillon is here for a physical exam and his chronic medical problems.   No concerns.  Doing well.   Seeing Dr Jean Rosenthal for b/l knee OA.     Medications and allergies reviewed with patient and updated if appropriate.  Current Outpatient Medications on File Prior to Visit  Medication Sig Dispense Refill   aspirin EC 81 MG tablet Take 81 mg by mouth daily.     celecoxib (CELEBREX) 200 MG capsule Take 1 capsule (200 mg total) by mouth 2 (two) times daily. 60 capsule 0   esomeprazole (NEXIUM) 40 MG capsule Take 1 capsule (40 mg total) by mouth daily before breakfast. (Patient taking differently: Take 20 mg by mouth daily. Take 2 tablets daily) 90 capsule 3   hydrochlorothiazide (MICROZIDE) 12.5 MG capsule TAKE 1 CAPSULE BY MOUTH EVERY DAY 90 capsule 1   meloxicam (MOBIC) 15 MG tablet Take 1 tablet (15 mg total) by mouth daily. 30 tablet 0   prednisoLONE acetate (PRED FORTE) 1 % ophthalmic suspension Place 1 drop into the left eye daily.     ramipril (ALTACE) 2.5 MG capsule TAKE 1 CAPSULE BY MOUTH EVERY DAY 90 capsule 3   rosuvastatin (CRESTOR) 40 MG tablet TAKE 1 TABLET BY MOUTH EVERY DAY 90 tablet 3   triamcinolone cream (KENALOG) 0.1 % Apply 1 application topically 2 (two) times daily. 30 g 0   valACYclovir (VALTREX) 1000 MG tablet Take 1,000 mg by mouth daily.     verapamil (CALAN-SR) 240 MG CR tablet TAKE 1 TABLET BY MOUTH EVERY DAY 90 tablet 2   No current facility-administered medications on file prior to visit.    Review of Systems  Constitutional:  Negative for fever.  Eyes:  Negative for visual disturbance.  Respiratory:  Negative for cough, shortness of breath and wheezing.   Cardiovascular:  Negative for chest pain, palpitations and leg swelling.  Gastrointestinal:  Negative for abdominal pain, blood in stool, constipation and diarrhea.       No gerd  Genitourinary:   Negative for difficulty urinating, dysuria and hematuria.  Musculoskeletal:  Positive for arthralgias (knees). Negative for back pain.  Skin:  Negative for rash.  Neurological:  Positive for numbness (mild in toes). Negative for light-headedness and headaches.  Psychiatric/Behavioral:  Negative for dysphoric mood. The patient is not nervous/anxious.        Objective:   Vitals:   03/10/23 1300  BP: 120/82  Pulse: 67  Temp: 98.4 F (36.9 C)  SpO2: 98%   Filed Weights   03/10/23 1300  Weight: 225 lb (102.1 kg)   Body mass index is 35.24 kg/m.  BP Readings from Last 3 Encounters:  03/10/23 120/82  03/03/23 132/84  02/17/23 112/67    Wt Readings from Last 3 Encounters:  03/10/23 225 lb (102.1 kg)  03/03/23 224 lb (101.6 kg)  02/17/23 215 lb (97.5 kg)      Physical Exam Constitutional: He appears well-developed and well-nourished. No distress.  HENT:  Head: Normocephalic and atraumatic.  Right Ear: External ear normal.  Left Ear: External ear normal.  Normal ear canals and TM b/l  Mouth/Throat: Oropharynx is clear and moist. Eyes: Conjunctivae and EOM are normal.  Neck: Neck supple. No tracheal deviation present. No thyromegaly present.  No carotid bruit  Cardiovascular: Normal rate, regular rhythm, normal heart sounds and intact distal  pulses.   No murmur heard.  No lower extremity edema. Pulmonary/Chest: Effort normal and breath sounds normal. No respiratory distress. He has no wheezes. He has no rales.  Abdominal: Soft. He exhibits no distension. There is no tenderness.  Genitourinary: deferred  Lymphadenopathy:   He has no cervical adenopathy.  Skin: Skin is warm and dry. He is not diaphoretic.  Psychiatric: He has a normal mood and affect. His behavior is normal.         Assessment & Plan:   Physical exam: Screening blood work  ordered Exercise   not regular - knee pain Weight  obese - encouraged weight loss Substance abuse   none   Reviewed  recommended immunizations.   Health Maintenance  Topic Date Due   COVID-19 Vaccine (3 - Pfizer risk series) 07/29/2019   HEMOGLOBIN A1C  03/06/2023   FOOT EXAM  03/10/2023 (Originally 12/04/2022)   Diabetic kidney evaluation - eGFR measurement  09/04/2023   Diabetic kidney evaluation - Urine ACR  09/04/2023   Medicare Annual Wellness (AWV)  12/19/2023   OPHTHALMOLOGY EXAM  01/29/2024   DTaP/Tdap/Td (2 - Td or Tdap) 05/29/2025   Colonoscopy  02/16/2030   Pneumonia Vaccine 40+ Years old  Completed   INFLUENZA VACCINE  Completed   Hepatitis C Screening  Completed   Zoster Vaccines- Shingrix  Completed   HPV VACCINES  Aged Out     See Problem List for Assessment and Plan of chronic medical problems.

## 2023-03-10 ENCOUNTER — Ambulatory Visit (INDEPENDENT_AMBULATORY_CARE_PROVIDER_SITE_OTHER): Payer: Medicare HMO | Admitting: Internal Medicine

## 2023-03-10 ENCOUNTER — Ambulatory Visit: Payer: Medicare HMO | Admitting: Sports Medicine

## 2023-03-10 VITALS — BP 120/82 | HR 67 | Temp 98.4°F | Ht 67.0 in | Wt 225.0 lb

## 2023-03-10 DIAGNOSIS — E0590 Thyrotoxicosis, unspecified without thyrotoxic crisis or storm: Secondary | ICD-10-CM

## 2023-03-10 DIAGNOSIS — E66811 Obesity, class 1: Secondary | ICD-10-CM | POA: Diagnosis not present

## 2023-03-10 DIAGNOSIS — M25562 Pain in left knee: Secondary | ICD-10-CM

## 2023-03-10 DIAGNOSIS — Z8673 Personal history of transient ischemic attack (TIA), and cerebral infarction without residual deficits: Secondary | ICD-10-CM | POA: Diagnosis not present

## 2023-03-10 DIAGNOSIS — E782 Mixed hyperlipidemia: Secondary | ICD-10-CM | POA: Diagnosis not present

## 2023-03-10 DIAGNOSIS — E1169 Type 2 diabetes mellitus with other specified complication: Secondary | ICD-10-CM | POA: Diagnosis not present

## 2023-03-10 DIAGNOSIS — M25561 Pain in right knee: Secondary | ICD-10-CM | POA: Diagnosis not present

## 2023-03-10 DIAGNOSIS — G8929 Other chronic pain: Secondary | ICD-10-CM

## 2023-03-10 DIAGNOSIS — Z125 Encounter for screening for malignant neoplasm of prostate: Secondary | ICD-10-CM | POA: Diagnosis not present

## 2023-03-10 DIAGNOSIS — K219 Gastro-esophageal reflux disease without esophagitis: Secondary | ICD-10-CM | POA: Diagnosis not present

## 2023-03-10 DIAGNOSIS — Z0000 Encounter for general adult medical examination without abnormal findings: Secondary | ICD-10-CM

## 2023-03-10 DIAGNOSIS — M170 Bilateral primary osteoarthritis of knee: Secondary | ICD-10-CM

## 2023-03-10 DIAGNOSIS — I10 Essential (primary) hypertension: Secondary | ICD-10-CM | POA: Diagnosis not present

## 2023-03-10 DIAGNOSIS — K222 Esophageal obstruction: Secondary | ICD-10-CM

## 2023-03-10 LAB — PSA, MEDICARE: PSA: 0 ng/mL — ABNORMAL LOW (ref 0.10–4.00)

## 2023-03-10 LAB — COMPREHENSIVE METABOLIC PANEL
ALT: 30 U/L (ref 0–53)
AST: 21 U/L (ref 0–37)
Albumin: 4.2 g/dL (ref 3.5–5.2)
Alkaline Phosphatase: 67 U/L (ref 39–117)
BUN: 18 mg/dL (ref 6–23)
CO2: 29 meq/L (ref 19–32)
Calcium: 9.6 mg/dL (ref 8.4–10.5)
Chloride: 98 meq/L (ref 96–112)
Creatinine, Ser: 0.87 mg/dL (ref 0.40–1.50)
GFR: 90.06 mL/min (ref >60.00)
Glucose, Bld: 118 mg/dL — ABNORMAL HIGH (ref 70–99)
Potassium: 4.7 meq/L (ref 3.5–5.1)
Sodium: 136 meq/L (ref 135–145)
Total Bilirubin: 0.4 mg/dL (ref 0.2–1.2)
Total Protein: 6.6 g/dL (ref 6.0–8.3)

## 2023-03-10 LAB — LIPID PANEL
Cholesterol: 146 mg/dL (ref 0–200)
HDL: 57.7 mg/dL (ref >39.00)
LDL Cholesterol: 56 mg/dL (ref 0–99)
NonHDL: 88.5
Total CHOL/HDL Ratio: 3
Triglycerides: 161 mg/dL — ABNORMAL HIGH (ref 0.0–149.0)
VLDL: 32.2 mg/dL (ref 0.0–40.0)

## 2023-03-10 LAB — CBC WITH DIFFERENTIAL/PLATELET
Basophils Absolute: 0.1 10*3/uL (ref 0.0–0.1)
Basophils Relative: 1.1 % (ref 0.0–3.0)
Eosinophils Absolute: 0.2 10*3/uL (ref 0.0–0.7)
Eosinophils Relative: 1.8 % (ref 0.0–5.0)
HCT: 45.3 % (ref 39.0–52.0)
Hemoglobin: 15.4 g/dL (ref 13.0–17.0)
Lymphocytes Relative: 15.4 % (ref 12.0–46.0)
Lymphs Abs: 1.4 10*3/uL (ref 0.7–4.0)
MCHC: 34 g/dL (ref 30.0–36.0)
MCV: 105.9 fL — ABNORMAL HIGH (ref 78.0–100.0)
Monocytes Absolute: 1 10*3/uL (ref 0.1–1.0)
Monocytes Relative: 10.7 % (ref 3.0–12.0)
Neutro Abs: 6.6 10*3/uL (ref 1.4–7.7)
Neutrophils Relative %: 71 % (ref 43.0–77.0)
Platelets: 296 10*3/uL (ref 150.0–400.0)
RBC: 4.28 Mil/uL (ref 4.22–5.81)
RDW: 12.7 % (ref 11.5–15.5)
WBC: 9.3 10*3/uL (ref 4.0–10.5)

## 2023-03-10 LAB — HEMOGLOBIN A1C: Hgb A1c MFr Bld: 6.8 % — ABNORMAL HIGH (ref 4.6–6.5)

## 2023-03-10 LAB — TSH: TSH: 0.34 u[IU]/mL — ABNORMAL LOW (ref 0.35–5.50)

## 2023-03-10 MED ORDER — TRIAMCINOLONE ACETONIDE 32 MG IX SRER
32.0000 mg | Freq: Once | INTRA_ARTICULAR | Status: AC
  Administered 2023-03-10: 32 mg via INTRA_ARTICULAR

## 2023-03-10 NOTE — Assessment & Plan Note (Signed)
Chronic  Lab Results  Component Value Date   HGBA1C 6.5 09/04/2022   Sugars well controlled Check A1c Eye exams up-to-date Continue lifestyle control Stressed regular exercise, diabetic diet

## 2023-03-10 NOTE — Assessment & Plan Note (Signed)
Chronic Has seen endocrine in the past and given stability no treatment was necessary I will continue to monitor TSH 

## 2023-03-10 NOTE — Patient Instructions (Signed)
Discontinue celebrex and use remainder as need 5-6 week follow up

## 2023-03-10 NOTE — Assessment & Plan Note (Addendum)
Chronic Advised weight loss Encouraged regular exercise - when able Advised decreased portions - diet high in protein, fiber, veges

## 2023-03-10 NOTE — Assessment & Plan Note (Signed)
Chronic GERD controlled Continue Nexium 40 mg daily 

## 2023-03-10 NOTE — Assessment & Plan Note (Addendum)
Chronic Regular exercise and healthy diet encouraged Check lipid panel  Continue Crestor 40 mg daily  Lab Results  Component Value Date   LDLCALC 42 09/04/2022

## 2023-03-10 NOTE — Assessment & Plan Note (Signed)
Chronic Blood pressure well controlled CMP, cbc Continue hydrochlorothiazide 12.5 mg daily, ramipril 2.5 mg daily, verapamil to 40 mg daily

## 2023-03-10 NOTE — Assessment & Plan Note (Signed)
Chronic With residual tinnitus Continue ASA 81 mg daily, crestor 40 mg daily BP well controlled, sugars controlled

## 2023-04-11 NOTE — Progress Notes (Unsigned)
Eric Dillon [name] [address] [place] [address] Phone: [phone]   Assessment and Plan:     There are no diagnoses linked to this encounter.  ***   Pertinent previous records reviewed include ***    Follow Up: ***     Subjective:   I, Eric Dillon, am serving as a Neurosurgeon for Eric Dillon   Chief Complaint: bilat knees   HPI:    09/05/22 Patient is a 68 year old male complaining of bilat knee pain. Patient states patient was carrying chairs down the steps and legs got tangled with the legs of the chairs and fell down the last two steps. Patient knees feels very very tight. Patient states that the left knee the pain radiates from the back of the knee laterally to the front. The right knee is located more so to the lateral right knee. Left knee hit the ground and not sure how the right knee hit because everything was tangle in the chairs. Patient used ice packs for the swelling. Patient states he was fine for a few days and progressively have gotten worse. This happened a week after mothers day. Pain is worse when going up or down steps or trying to bend over. Walking is Just uncomfortable     09/30/2022 Patient states he is better not a 100 percent but better than when he came in    10/28/2022 Patient states that he is better , not a 100 % but much better    11/26/2022 Patient states that his knee pain has improved. Slight pain with stairs but is able to use reciprocal gait.    03/03/2023 Patient states his pain returned about a month ago right is worse than left . Now endorses a snap crackle pop that is painful   03/10/2023 Patient states continued bilateral knee pain, right knee worse than left.  Patient has had better relief with Celebrex comparatively to meloxicam.  04/14/2023 Patient states   Relevant Historical Information: GERD, hypertension, DM type II, history of  Additional pertinent review of systems  negative.   Current Outpatient Medications:    aspirin EC 81 MG tablet, Take 81 mg by mouth daily., Disp: , Rfl:    celecoxib (CELEBREX) 200 MG capsule, Take 1 capsule (200 mg total) by mouth 2 (two) times daily., Disp: 60 capsule, Rfl: 0   esomeprazole (NEXIUM) 40 MG capsule, Take 1 capsule (40 mg total) by mouth daily before breakfast. (Patient taking differently: Take 20 mg by mouth daily. Take 2 tablets daily), Disp: 90 capsule, Rfl: 3   hydrochlorothiazide (MICROZIDE) 12.5 MG capsule, TAKE 1 CAPSULE BY MOUTH EVERY DAY, Disp: 90 capsule, Rfl: 1   prednisoLONE acetate (PRED FORTE) 1 % ophthalmic suspension, Place 1 drop into the left eye daily., Disp: , Rfl:    ramipril (ALTACE) 2.5 MG capsule, TAKE 1 CAPSULE BY MOUTH EVERY DAY, Disp: 90 capsule, Rfl: 3   rosuvastatin (CRESTOR) 40 MG tablet, TAKE 1 TABLET BY MOUTH EVERY DAY, Disp: 90 tablet, Rfl: 3   triamcinolone cream (KENALOG) 0.1 %, Apply 1 application topically 2 (two) times daily., Disp: 30 g, Rfl: 0   valACYclovir (VALTREX) 1000 MG tablet, Take 1,000 mg by mouth daily., Disp: , Rfl:    verapamil (CALAN-SR) 240 MG CR tablet, TAKE 1 TABLET BY MOUTH EVERY DAY, Disp: 90 tablet, Rfl: 2   Objective:     There were no vitals filed for this visit.  There is no height or weight on file to calculate BMI.    Physical Exam:    ***   Electronically signed by:  Eric Dillon [name] 7:27 AM 04/11/23

## 2023-04-14 ENCOUNTER — Ambulatory Visit (INDEPENDENT_AMBULATORY_CARE_PROVIDER_SITE_OTHER): Payer: Medicare HMO | Admitting: Sports Medicine

## 2023-04-14 VITALS — BP 130/86 | HR 78 | Ht 67.0 in | Wt 220.0 lb

## 2023-04-14 DIAGNOSIS — M25562 Pain in left knee: Secondary | ICD-10-CM

## 2023-04-14 DIAGNOSIS — G8929 Other chronic pain: Secondary | ICD-10-CM

## 2023-04-14 DIAGNOSIS — M170 Bilateral primary osteoarthritis of knee: Secondary | ICD-10-CM

## 2023-04-14 DIAGNOSIS — M25561 Pain in right knee: Secondary | ICD-10-CM | POA: Diagnosis not present

## 2023-04-14 NOTE — Patient Instructions (Signed)
We will call you when you are approved to schedule  Tylenol [phone] mg 2-3 times a day for pain relief

## 2023-04-22 ENCOUNTER — Telehealth: Payer: Self-pay

## 2023-04-22 NOTE — Telephone Encounter (Signed)
  Durolane approved for right knee Plan covers 80% of allowable amount for Durolane [id number] and 100%of allowable amount for procedure 20610/20611  Deductibles do not apply to these services  Patient has a $30 copay whether or not an office visit is billed Only one copay per DOS Once OOP is met coverage goes to 100% and copay will no longer apply No pre cert or referrals needed Medical notes are optional while submitting the claims As of 12/24/22 [employer]ia [employer] medicare will no longer require pre cert for the drug Durolane Document scanned

## 2023-04-24 NOTE — Telephone Encounter (Signed)
Scheduled for 04/28/2023.

## 2023-04-28 ENCOUNTER — Ambulatory Visit (INDEPENDENT_AMBULATORY_CARE_PROVIDER_SITE_OTHER): Payer: Medicare HMO | Admitting: Sports Medicine

## 2023-04-28 DIAGNOSIS — G8929 Other chronic pain: Secondary | ICD-10-CM | POA: Diagnosis not present

## 2023-04-28 DIAGNOSIS — M25561 Pain in right knee: Secondary | ICD-10-CM

## 2023-04-28 DIAGNOSIS — M1711 Unilateral primary osteoarthritis, right knee: Secondary | ICD-10-CM | POA: Diagnosis not present

## 2023-04-28 MED ORDER — SODIUM HYALURONATE 60 MG/3ML IX PRSY
60.0000 mg | PREFILLED_SYRINGE | Freq: Once | INTRA_ARTICULAR | Status: AC
  Administered 2023-04-28: 60 mg via INTRA_ARTICULAR

## 2023-04-28 NOTE — Progress Notes (Signed)
[name] D[name] [address] [place] [address] Phone: [phone]   Assessment and Plan:     1. Primary osteoarthritis of right knee 2. Chronic pain of right knee -Chronic with exacerbation, subsequent visit - Patient presents for procedure only visit Durling injection to right knee for treatment of flare of osteoarthritis.  Tolerated well per note below - Patient has had good relief with Zilretta injections last performed on bilateral knees on 03/10/2023 -Continue Tylenol 500 to 1000 mg tablets 2-3 times a day for day-to-day pain relief  -Continue Celebrex 200 mg twice daily as needed for breakthrough pain.  Recommend limiting chronic NSAIDs to 1-2 doses per week  Procedure: Knee Joint Injection Side: Right Indication: Flare of osteoarthritis  Risks explained and consent was given verbally. The site was cleaned with alcohol prep. A needle was introduced with an anterio-lateral approach. Injection given using 60 mg Durolane.  This was well tolerated and resulted in symptomatic relief.  Needle was removed, hemostasis achieved, and post injection instructions were explained.   Pt was advised to call or return to clinic if these symptoms worsen or fail to improve as anticipated.   Pertinent previous records reviewed include none  Follow Up: As needed if no improvement or worsening of symptoms.  Could consider repeat Zilretta versus HA, versus discussing PRP or advanced imaging   Subjective:   I, [name], am serving as a Neurosurgeon for Doctor Richardean Sale   Chief Complaint: bilat knees   HPI:    09/05/22 Patient is a 68 year old male complaining of bilat knee pain. Patient states patient was carrying chairs down the steps and legs got tangled with the legs of the chairs and fell down the last two steps. Patient knees feels very very tight. Patient states that the left knee the pain radiates from the back of the knee  laterally to the front. The right knee is located more so to the lateral right knee. Left knee hit the ground and not sure how the right knee hit because everything was tangle in the chairs. Patient used ice packs for the swelling. Patient states he was fine for a few days and progressively have gotten worse. This happened a week after mothers day. Pain is worse when going up or down steps or trying to bend over. Walking is Just uncomfortable     09/30/2022 Patient states he is better not a 100 percent but better than when he came in    10/28/2022 Patient states that he is better , not a 100 % but much better    11/26/2022 Patient states that his knee pain has improved. Slight pain with stairs but is able to use reciprocal gait.    03/03/2023 Patient states his pain returned about a month ago right is worse than left . Now endorses a snap crackle pop that is painful   03/10/2023 Patient states continued bilateral knee pain, right knee worse than left.  Patient has had better relief with Celebrex comparatively to meloxicam.   04/14/2023 Patient states he is better but not great   04/28/2023 Patient states   Relevant Historical Information: GERD, hypertension, DM type II, history of    Additional pertinent review of systems negative.   Current Outpatient Medications:    aspirin EC 81 MG tablet, Take 81 mg by mouth daily., Disp: , Rfl:    celecoxib (CELEBREX) 200 MG capsule, Take 1 capsule (200 mg total) by mouth 2 (  two) times daily., Disp: 60 capsule, Rfl: 0   esomeprazole (NEXIUM) 40 MG capsule, Take 1 capsule (40 mg total) by mouth daily before breakfast. (Patient taking differently: Take 20 mg by mouth daily. Take 2 tablets daily), Disp: 90 capsule, Rfl: 3   hydrochlorothiazide (MICROZIDE) 12.5 MG capsule, TAKE 1 CAPSULE BY MOUTH EVERY DAY, Disp: 90 capsule, Rfl: 1   prednisoLONE acetate (PRED FORTE) 1 % ophthalmic suspension, Place 1 drop into the left eye daily., Disp: , Rfl:    ramipril  (ALTACE) 2.5 MG capsule, TAKE 1 CAPSULE BY MOUTH EVERY DAY, Disp: 90 capsule, Rfl: 3   rosuvastatin (CRESTOR) 40 MG tablet, TAKE 1 TABLET BY MOUTH EVERY DAY, Disp: 90 tablet, Rfl: 3   triamcinolone cream (KENALOG) 0.1 %, Apply 1 application topically 2 (two) times daily., Disp: 30 g, Rfl: 0   valACYclovir (VALTREX) 1000 MG tablet, Take 1,000 mg by mouth daily., Disp: , Rfl:    verapamil (CALAN-SR) 240 MG CR tablet, TAKE 1 TABLET BY MOUTH EVERY DAY, Disp: 90 tablet, Rfl: 2      Electronically signed by:  [name] [name] 2:08 PM 04/28/23

## 2023-05-05 DIAGNOSIS — L821 Other seborrheic keratosis: Secondary | ICD-10-CM | POA: Diagnosis not present

## 2023-05-05 DIAGNOSIS — C44212 Basal cell carcinoma of skin of right ear and external auricular canal: Secondary | ICD-10-CM | POA: Diagnosis not present

## 2023-05-05 DIAGNOSIS — L814 Other melanin hyperpigmentation: Secondary | ICD-10-CM | POA: Diagnosis not present

## 2023-05-05 DIAGNOSIS — D225 Melanocytic nevi of trunk: Secondary | ICD-10-CM | POA: Diagnosis not present

## 2023-05-05 DIAGNOSIS — D492 Neoplasm of unspecified behavior of bone, soft tissue, and skin: Secondary | ICD-10-CM | POA: Diagnosis not present

## 2023-05-14 ENCOUNTER — Other Ambulatory Visit: Payer: Self-pay | Admitting: Internal Medicine

## 2023-06-03 ENCOUNTER — Telehealth: Admitting: Physician Assistant

## 2023-06-03 DIAGNOSIS — M545 Low back pain, unspecified: Secondary | ICD-10-CM

## 2023-06-03 MED ORDER — PREDNISONE 10 MG (21) PO TBPK: ORAL_TABLET | ORAL | 0 refills | Status: DC

## 2023-06-03 NOTE — Progress Notes (Signed)
 I have spent 5 minutes in review of e-visit questionnaire, review and updating patient chart, medical decision making and response to patient.   Piedad Climes, PA-C

## 2023-06-03 NOTE — Progress Notes (Signed)
 E-Visit for Back Pain   We are sorry that you are not feeling well.  Here is how we plan to help!  Based on what you have shared with me it looks like you mostly have acute back pain.  Acute back pain is defined as musculoskeletal pain that can resolve in 1-3 weeks with conservative treatment.  I have prescribed  a prednisone pack to take as directed.  Some patients experience stomach irritation or in increased heartburn with anti-inflammatory drugs.  .  Back pain is very common.  The pain often gets better over time.  The cause of back pain is usually not dangerous.  Most people can learn to manage their back pain on their own.  Home Care Stay active.  Start with short walks on flat ground if you can.  Try to walk farther each day. Do not sit, drive or stand in one place for more than 30 minutes.  Do not stay in bed. Do not avoid exercise or work.  Activity can help your back heal faster. Be careful when you bend or lift an object.  Bend at your knees, keep the object close to you, and do not twist. Sleep on a firm mattress.  Lie on your side, and bend your knees.  If you lie on your back, put a pillow under your knees. Only take medicines as told by your doctor. Put ice on the injured area. Put ice in a plastic bag Place a towel between your skin and the bag Leave the ice on for 15-20 minutes, 3-4 times a day for the first 2-3 days. 210 After that, you can switch between ice and heat packs. Ask your doctor about back exercises or massage. Avoid feeling anxious or stressed.  Find good ways to deal with stress, such as exercise.  Get Help Right Way If: Your pain does not go away with rest or medicine. Your pain does not go away in 1 week. You have new problems. You do not feel well. The pain spreads into your legs. You cannot control when you poop (bowel movement) or pee (urinate) You feel sick to your stomach (nauseous) or throw up (vomit) You have belly (abdominal) pain. You feel  like you may pass out (faint). If you develop a fever.  Make Sure you: Understand these instructions. Will watch your condition Will get help right away if you are not doing well or get worse.  Your e-visit answers were reviewed by a board certified advanced clinical practitioner to complete your personal care plan.  Depending on the condition, your plan could have included both over the counter or prescription medications.  If there is a problem please reply  once you have received a response from your provider.  Your safety is important to [place].  If you have drug allergies check your prescription carefully.    You can use MyChart to ask questions about today's visit, request a non-urgent call back, or ask for a work or school excuse for 24 hours related to this e-Visit. If it has been greater than 24 hours you will need to follow up with your provider, or enter a new e-Visit to address those concerns.  You will get an e-mail in the next two days asking about your experience.  I hope that your e-visit has been valuable and will speed your recovery. Thank you for using e-visits.

## 2023-06-09 DIAGNOSIS — C44212 Basal cell carcinoma of skin of right ear and external auricular canal: Secondary | ICD-10-CM | POA: Diagnosis not present

## 2023-07-15 DIAGNOSIS — Z48817 Encounter for surgical aftercare following surgery on the skin and subcutaneous tissue: Secondary | ICD-10-CM | POA: Diagnosis not present

## 2023-07-16 DIAGNOSIS — M170 Bilateral primary osteoarthritis of knee: Secondary | ICD-10-CM | POA: Diagnosis not present

## 2023-07-16 DIAGNOSIS — G8929 Other chronic pain: Secondary | ICD-10-CM | POA: Diagnosis not present

## 2023-08-01 DIAGNOSIS — M25561 Pain in right knee: Secondary | ICD-10-CM | POA: Diagnosis not present

## 2023-08-09 ENCOUNTER — Other Ambulatory Visit: Payer: Self-pay | Admitting: Internal Medicine

## 2023-08-11 DIAGNOSIS — M170 Bilateral primary osteoarthritis of knee: Secondary | ICD-10-CM | POA: Diagnosis not present

## 2023-09-02 DIAGNOSIS — M170 Bilateral primary osteoarthritis of knee: Secondary | ICD-10-CM | POA: Diagnosis not present

## 2023-09-07 ENCOUNTER — Encounter: Payer: Self-pay | Admitting: Internal Medicine

## 2023-09-07 NOTE — Progress Notes (Unsigned)
 Subjective:    Patient ID: Eric Dillon, male    DOB: Jul 04, 1955, 68 y.o.   MRN: [id number]     HPI Eric Dillon is here for follow up of his chronic medical problems.  Knee OA has gotten worse-following with [facility] and has been getting injections  Feet feels like he is walking on something - felt like a scab on the bottom of his feet - now extending to the toes.  Saw podiatry - nerve pain - ? back  Medications and allergies reviewed with patient and updated if appropriate.  Current Outpatient Medications on File Prior to Visit  Medication Sig Dispense Refill   aspirin EC 81 MG tablet Take 81 mg by mouth daily.     celecoxib  (CELEBREX ) 200 MG capsule Take 1 capsule (200 mg total) by mouth 2 (two) times daily. 60 capsule 0   esomeprazole  (NEXIUM ) 40 MG capsule Take 1 capsule (40 mg total) by mouth daily before breakfast. (Patient taking differently: Take 20 mg by mouth daily. Take 2 tablets daily) 90 capsule 3   hydrochlorothiazide  (MICROZIDE ) 12.5 MG capsule TAKE 1 CAPSULE BY MOUTH EVERY DAY 90 capsule 1   prednisoLONE acetate (PRED FORTE) 1 % ophthalmic suspension Place 1 drop into the left eye daily.     ramipril  (ALTACE ) 2.5 MG capsule TAKE 1 CAPSULE BY MOUTH EVERY DAY 90 capsule 3   rosuvastatin  (CRESTOR ) 40 MG tablet TAKE 1 TABLET BY MOUTH EVERY DAY 90 tablet 3   triamcinolone  cream (KENALOG ) 0.1 % Apply 1 application topically 2 (two) times daily. 30 g 0   valACYclovir (VALTREX) 1000 MG tablet Take 1,000 mg by mouth daily.     verapamil  (CALAN -SR) 240 MG CR tablet TAKE 1 TABLET BY MOUTH EVERY DAY 90 tablet 2   No current facility-administered medications on file prior to visit.     Review of Systems  Constitutional:  Negative for fever.  Respiratory:  Negative for cough, shortness of breath and wheezing.   Cardiovascular:  Positive for leg swelling (from knee OA). Negative for chest pain and palpitations.  Neurological:  Positive for numbness (decreased sensation  in distal feet). Negative for light-headedness and headaches.       Objective:   Vitals:   09/08/23 1302 09/08/23 1326  BP: (!) 140/92 134/80  Pulse: 72   Temp: 98.1 F (36.7 C)   SpO2: 97%    BP Readings from Last 3 Encounters:  09/08/23 134/80  04/14/23 130/86  03/10/23 120/82   Wt Readings from Last 3 Encounters:  09/08/23 213 lb (96.6 kg)  04/14/23 220 lb (99.8 kg)  03/10/23 225 lb (102.1 kg)   Body mass index is 33.36 kg/m.    Physical Exam Constitutional:      General: He is not in acute distress.    Appearance: Normal appearance. He is not ill-appearing.  HENT:     Head: Normocephalic and atraumatic.   Eyes:     Conjunctiva/sclera: Conjunctivae normal.    Cardiovascular:     Rate and Rhythm: Normal rate and regular rhythm.     Heart sounds: Normal heart sounds.  Pulmonary:     Effort: Pulmonary effort is normal. No respiratory distress.     Breath sounds: Normal breath sounds. No wheezing or rales.   Musculoskeletal:     Right lower leg: No edema.     Left lower leg: No edema.   Skin:    General: Skin is warm and dry.  Findings: No rash.   Neurological:     Mental Status: He is alert. Mental status is at baseline.   Psychiatric:        Mood and Affect: Mood normal.       Diabetic Foot Exam - Simple   Simple Foot Form Diabetic Foot exam was performed with the following findings: Yes 09/08/2023  1:23 PM  Visual Inspection No deformities, no ulcerations, no other skin breakdown bilaterally: Yes Sensation Testing Intact to touch and monofilament testing bilaterally: Yes Pulse Check Posterior Tibialis and Dorsalis pulse intact bilaterally: Yes Comments      Lab Results  Component Value Date   WBC 9.3 03/10/2023   HGB 15.4 03/10/2023   HCT 45.3 03/10/2023   PLT 296.0 03/10/2023   GLUCOSE 118 (H) 03/10/2023   CHOL 146 03/10/2023   TRIG 161.0 (H) 03/10/2023   HDL 57.70 03/10/2023   LDLDIRECT 84.0 02/28/2020   LDLCALC 56  03/10/2023   ALT 30 03/10/2023   AST 21 03/10/2023   NA 136 03/10/2023   K 4.7 03/10/2023   CL 98 03/10/2023   CREATININE 0.87 03/10/2023   BUN 18 03/10/2023   CO2 29 03/10/2023   TSH 0.34 (L) 03/10/2023   PSA 0.00 (L) 03/10/2023   INR 0.93 01/22/2010   HGBA1C 6.8 (H) 03/10/2023   MICROALBUR 2.1 (H) 09/04/2022     Assessment & Plan:    See Problem List for Assessment and Plan of chronic medical problems.

## 2023-09-07 NOTE — Patient Instructions (Addendum)
      Blood work was ordered.       Medications changes include :   None    A referral was ordered and someone will call you to schedule an appointment.     Return in about 6 months (around 03/09/2024) for Physical Exam.

## 2023-09-08 ENCOUNTER — Ambulatory Visit (INDEPENDENT_AMBULATORY_CARE_PROVIDER_SITE_OTHER): Payer: Medicare HMO | Admitting: Internal Medicine

## 2023-09-08 VITALS — BP 134/80 | HR 72 | Temp 98.1°F | Ht 67.0 in | Wt 213.0 lb

## 2023-09-08 DIAGNOSIS — E782 Mixed hyperlipidemia: Secondary | ICD-10-CM | POA: Diagnosis not present

## 2023-09-08 DIAGNOSIS — G5793 Unspecified mononeuropathy of bilateral lower limbs: Secondary | ICD-10-CM | POA: Insufficient documentation

## 2023-09-08 DIAGNOSIS — M170 Bilateral primary osteoarthritis of knee: Secondary | ICD-10-CM | POA: Insufficient documentation

## 2023-09-08 DIAGNOSIS — K219 Gastro-esophageal reflux disease without esophagitis: Secondary | ICD-10-CM

## 2023-09-08 DIAGNOSIS — K222 Esophageal obstruction: Secondary | ICD-10-CM | POA: Diagnosis not present

## 2023-09-08 DIAGNOSIS — E66811 Obesity, class 1: Secondary | ICD-10-CM | POA: Diagnosis not present

## 2023-09-08 DIAGNOSIS — E0590 Thyrotoxicosis, unspecified without thyrotoxic crisis or storm: Secondary | ICD-10-CM

## 2023-09-08 DIAGNOSIS — Z8673 Personal history of transient ischemic attack (TIA), and cerebral infarction without residual deficits: Secondary | ICD-10-CM | POA: Diagnosis not present

## 2023-09-08 DIAGNOSIS — I10 Essential (primary) hypertension: Secondary | ICD-10-CM

## 2023-09-08 DIAGNOSIS — E1169 Type 2 diabetes mellitus with other specified complication: Secondary | ICD-10-CM

## 2023-09-08 NOTE — Assessment & Plan Note (Signed)
Chronic GERD controlled Continue Nexium 40 mg daily 

## 2023-09-08 NOTE — Assessment & Plan Note (Signed)
 Chronic Blood pressure initially elevated-better controlled on repeat CMP, cbc Continue hydrochlorothiazide  12.5 mg daily, ramipril  2.5 mg daily, verapamil  to 40 mg daily

## 2023-09-08 NOTE — Assessment & Plan Note (Signed)
 Chronic Regular exercise and healthy diet encouraged Check lipid panel  Continue Crestor  40 mg daily  Lab Results  Component Value Date   LDLCALC 56 03/10/2023

## 2023-09-08 NOTE — Assessment & Plan Note (Signed)
 Chronic  Lab Results  Component Value Date   HGBA1C 6.8 (H) 03/10/2023   Sugars well controlled Check A1c, urine albumin/creatinine ratio Eye exams up-to-date Continue lifestyle control Stressed regular exercise, diabetic diet

## 2023-09-08 NOTE — Assessment & Plan Note (Signed)
 Chronic Advised weight loss Encouraged regular exercise - when able Advised decreased portions - diet high in protein, fiber, veges

## 2023-09-08 NOTE — Assessment & Plan Note (Signed)
 Chronic [facility] Getting injections

## 2023-09-08 NOTE — Assessment & Plan Note (Signed)
 Subacute Has had a sensation of something on the bottom of his foot Possibly feeling a decrease sensation Likely neuropathy ?  From back versus diabetes Discussed possibly seeing a neurologist-he deferred for now but will let me know if he changes his mind Normal sensation plantar surfaces of feet

## 2023-09-08 NOTE — Assessment & Plan Note (Signed)
 Chronic With residual tinnitus Continue ASA 81 mg daily, crestor 40 mg daily BP well controlled, sugars controlled

## 2023-09-08 NOTE — Assessment & Plan Note (Signed)
Chronic Has seen endocrine in the past and given stability no treatment was necessary I will continue to monitor TSH 

## 2023-09-09 LAB — CBC WITH DIFFERENTIAL/PLATELET
Absolute Lymphocytes: 1544 {cells}/uL (ref 850–3900)
Absolute Monocytes: 1170 {cells}/uL — ABNORMAL HIGH (ref 200–950)
Basophils Absolute: 62 {cells}/uL (ref 0–200)
Basophils Relative: 0.4 %
Eosinophils Absolute: 125 {cells}/uL (ref 15–500)
Eosinophils Relative: 0.8 %
HCT: 45.8 % (ref 38.5–50.0)
Hemoglobin: 16.1 g/dL (ref 13.2–17.1)
MCH: 36 pg — ABNORMAL HIGH (ref 27.0–33.0)
MCHC: 35.2 g/dL (ref 32.0–36.0)
MCV: 102.5 fL — ABNORMAL HIGH (ref 80.0–100.0)
MPV: 10.6 fL (ref 7.5–12.5)
Monocytes Relative: 7.5 %
Neutro Abs: 12698 {cells}/uL — ABNORMAL HIGH (ref 1500–7800)
Neutrophils Relative %: 81.4 %
Platelets: 270 10*3/uL (ref 140–400)
RBC: 4.47 10*6/uL (ref 4.20–5.80)
RDW: 11.8 % (ref 11.0–15.0)
Total Lymphocyte: 9.9 %
WBC: 15.6 10*3/uL — ABNORMAL HIGH (ref 3.8–10.8)

## 2023-09-09 LAB — LIPID PANEL
Cholesterol: 163 mg/dL (ref <200)
HDL: 77 mg/dL (ref >=40)
LDL Cholesterol (Calc): 63 mg/dL
Non-HDL Cholesterol (Calc): 86 mg/dL (ref <130)
Total CHOL/HDL Ratio: 2.1 (calc) (ref <5.0)
Triglycerides: 149 mg/dL (ref <150)

## 2023-09-09 LAB — COMPREHENSIVE METABOLIC PANEL WITH GFR
AG Ratio: 1.7 (calc) (ref 1.0–2.5)
ALT: 21 U/L (ref 9–46)
AST: 14 U/L (ref 10–35)
Albumin: 4.3 g/dL (ref 3.6–5.1)
Alkaline phosphatase (APISO): 68 U/L (ref 35–144)
BUN: 24 mg/dL (ref 7–25)
CO2: 26 mmol/L (ref 20–32)
Calcium: 9.9 mg/dL (ref 8.6–10.3)
Chloride: 95 mmol/L — ABNORMAL LOW (ref 98–110)
Creat: 0.94 mg/dL (ref 0.70–1.35)
Globulin: 2.5 g/dL (ref 1.9–3.7)
Glucose, Bld: 198 mg/dL — ABNORMAL HIGH (ref 65–99)
Potassium: 4.1 mmol/L (ref 3.5–5.3)
Sodium: 132 mmol/L — ABNORMAL LOW (ref 135–146)
Total Bilirubin: 0.4 mg/dL (ref 0.2–1.2)
Total Protein: 6.8 g/dL (ref 6.1–8.1)
eGFR: 89 mL/min/{1.73_m2} (ref >=60)

## 2023-09-09 LAB — TSH: TSH: 0.32 m[IU]/L — ABNORMAL LOW (ref 0.40–4.50)

## 2023-09-09 LAB — MICROALBUMIN / CREATININE URINE RATIO
Creatinine, Urine: 76 mg/dL (ref 20–320)
Microalb Creat Ratio: 5 mg/g{creat} (ref <30)
Microalb, Ur: 0.4 mg/dL

## 2023-09-09 LAB — HEMOGLOBIN A1C
Hgb A1c MFr Bld: 6.7 % — ABNORMAL HIGH (ref <5.7)
Mean Plasma Glucose: 146 mg/dL
eAG (mmol/L): 8.1 mmol/L

## 2023-09-09 LAB — VITAMIN B12: Vitamin B-12: 284 pg/mL (ref 200–1100)

## 2023-09-10 ENCOUNTER — Ambulatory Visit: Payer: Self-pay | Admitting: Internal Medicine

## 2023-10-26 ENCOUNTER — Encounter: Payer: Self-pay | Admitting: Internal Medicine

## 2023-10-26 DIAGNOSIS — G5793 Unspecified mononeuropathy of bilateral lower limbs: Secondary | ICD-10-CM

## 2023-10-28 ENCOUNTER — Encounter: Payer: Self-pay | Admitting: Neurology

## 2023-11-03 DIAGNOSIS — L821 Other seborrheic keratosis: Secondary | ICD-10-CM | POA: Diagnosis not present

## 2023-11-03 DIAGNOSIS — D225 Melanocytic nevi of trunk: Secondary | ICD-10-CM | POA: Diagnosis not present

## 2023-11-03 DIAGNOSIS — L814 Other melanin hyperpigmentation: Secondary | ICD-10-CM | POA: Diagnosis not present

## 2023-11-03 DIAGNOSIS — Z872 Personal history of diseases of the skin and subcutaneous tissue: Secondary | ICD-10-CM | POA: Diagnosis not present

## 2023-11-03 DIAGNOSIS — L570 Actinic keratosis: Secondary | ICD-10-CM | POA: Diagnosis not present

## 2023-11-05 DIAGNOSIS — M170 Bilateral primary osteoarthritis of knee: Secondary | ICD-10-CM | POA: Diagnosis not present

## 2023-11-05 DIAGNOSIS — G8929 Other chronic pain: Secondary | ICD-10-CM | POA: Diagnosis not present

## 2023-11-06 ENCOUNTER — Other Ambulatory Visit: Payer: Self-pay | Admitting: Internal Medicine

## 2023-12-04 ENCOUNTER — Encounter: Payer: Self-pay | Admitting: Cardiovascular Disease

## 2023-12-08 ENCOUNTER — Ambulatory Visit: Admitting: Neurology

## 2023-12-08 ENCOUNTER — Other Ambulatory Visit

## 2023-12-08 ENCOUNTER — Encounter: Payer: Self-pay | Admitting: Neurology

## 2023-12-08 VITALS — BP 136/81 | HR 78 | Ht 67.0 in | Wt 216.0 lb

## 2023-12-08 DIAGNOSIS — R202 Paresthesia of skin: Secondary | ICD-10-CM | POA: Diagnosis not present

## 2023-12-08 NOTE — Patient Instructions (Signed)
 Check labs  Nerve testing of the legs  ELECTROMYOGRAM AND NERVE CONDUCTION STUDIES (EMG/NCS) INSTRUCTIONS  How to Prepare The neurologist conducting the EMG will need to know if you have certain medical conditions. Tell the neurologist and other EMG lab personnel if you: Have a pacemaker or any other electrical medical device Take blood-thinning medications Have hemophilia, a blood-clotting disorder that causes prolonged bleeding Bathing Take a shower or bath shortly before your exam in order to remove oils from your skin. Don't apply lotions or creams before the exam.  What to Expect You'll likely be asked to change into a hospital gown for the procedure and lie down on an examination table. The following explanations can help you understand what will happen during the exam.  Electrodes. The neurologist or a technician places surface electrodes at various locations on your skin depending on where you're experiencing symptoms. Or the neurologist may insert needle electrodes at different sites depending on your symptoms.  Sensations. The electrodes will at times transmit a tiny electrical current that you may feel as a twinge or spasm. The needle electrode may cause discomfort or pain that usually ends shortly after the needle is removed. If you are concerned about discomfort or pain, you may want to talk to the neurologist about taking a short break during the exam.  Instructions. During the needle EMG, the neurologist will assess whether there is any spontaneous electrical activity when the muscle is at rest - activity that isn't present in healthy muscle tissue - and the degree of activity when you slightly contract the muscle.  He or she will give you instructions on resting and contracting a muscle at appropriate times. Depending on what muscles and nerves the neurologist is examining, he or she may ask you to change positions during the exam.  After your EMG You may experience some  temporary, minor bruising where the needle electrode was inserted into your muscle. This bruising should fade within several days. If it persists, contact your primary care doctor.

## 2023-12-08 NOTE — Progress Notes (Signed)
 [facility] Clinic Note - Initial Visit   Date: 12/08/2023   JACEION ADAY MRN: [id number] DOB: [birth year]   Dear Dr. Geofm:  Thank you for your kind referral of LEVON BOETTCHER for consultation of neuropathy. Although his history is well known to you, please allow us  to reiterate it for the purpose of our medical record. The patient was accompanied to the clinic by self.   JENNER ROSIER is a 68 y.o. 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   IMPRESSION/PLAN: Bilateral distal feet paresthesia.  Neurological exam shows normal distal sensation, strength, and reflexes.  Patella reflex is brisk bilaterally. With his normal exam, S1 radiculopathy vs small fiber neuropathy is also considered.  I discussed that he does have risk factor for neuropathy - diabetes and alcohol and urged him to try to cut back alcohol consumption.   - Check vitamin B12, folate, vitamin B1, SPEP with IFE, copper   - NCS/EMG of the legs  Return to clinic in 4 months  ------------------------------------------------------------- History of present illness: For the past 2-3 years, he began having numbness/tingling involving the balls of the feet which slowly started to move into the toes.  Occasionally, he has burning and needle sensation of the feet.  He denies weakness.  He walks unassisted.  He has chronic knee pain and has some imbalance related to this.   Prior smoker.  He drinks 3-4 liquor nightly for the past several years.  He previously works as a Air cabin crew. He lives with wife in a two level home.   Out-side paper records, electronic medical record, and images have been reviewed where available and summarized as:  Lab Results  Component Value Date   HGBA1C 6.7 (H) 09/08/2023   Lab  Results  Component Value Date   VITAMINB12 284 09/08/2023   Lab Results  Component Value Date   TSH 0.32 (L) 09/08/2023     Past Medical History:  Diagnosis Date   Barrett's esophagus - RESOLVED 2008   RESOLVED ON SUBSEQUENT EXAMS   Carotid artery dissection (HCC) 2011   L   Diverticulosis    GERD (gastroesophageal reflux disease)    Goiter    Hemorrhoids    Horner's syndrome 2012   Hyperlipidemia    Hypertension    Personal history of colonic adenomas 01/14/2008   Prostate cancer (HCC) 2010   Stroke (HCC) 1996   FROM DISSECTING R CAROID ARTERY     Past Surgical History:  Procedure Laterality Date   BIOPSY THYROID   2007   colloid  admixed with scant follicular epithelium   CAROTID ARTERY ANGIOPLASTY  2011   L   COLONOSCOPY     COLONOSCOPY W/ BIOPSIES AND POLYPECTOMY  multiple   adenomatous polyps, internal hemorrhoids, diverticulosis   CORNEAL TRANSPLANT Left 2015    corneal pannus & perforation,[facility]   DISSECTION OF CAROID ARTERY     WITH CVA   KNEE SURGERY     RIGHT..TO STRIGHTEN PATELLA   ROBOTIC PROSTATECTOMY  2011   Dr Noretta Ferrara   UPPER GASTROINTESTINAL ENDOSCOPY  multiple   w/dilation, Barrett's , esophageal ring, hiatal hernia, gastritis     Medications:  Outpatient Encounter Medications as of 12/08/2023  Medication Sig Note   aspirin EC 81 MG tablet Take 81 mg by mouth daily.    celecoxib  (CELEBREX ) 200 MG capsule Take 1 capsule (  200 mg total) by mouth 2 (two) times daily.    hydrochlorothiazide  (MICROZIDE ) 12.5 MG capsule TAKE 1 CAPSULE BY MOUTH EVERY DAY    prednisoLONE acetate (PRED FORTE) 1 % ophthalmic suspension Place 1 drop into the left eye daily.    ramipril  (ALTACE ) 2.5 MG capsule TAKE 1 CAPSULE BY MOUTH EVERY DAY    rosuvastatin  (CRESTOR ) 40 MG tablet TAKE 1 TABLET BY MOUTH EVERY DAY    triamcinolone  cream (KENALOG ) 0.1 % Apply 1 application topically 2 (two) times daily. 01/20/2023: Only as needed    valACYclovir (VALTREX) 1000 MG  tablet Take 1,000 mg by mouth daily.    esomeprazole  (NEXIUM ) 40 MG capsule Take 1 capsule (40 mg total) by mouth daily before breakfast. (Patient taking differently: Take 20 mg by mouth daily. Take 2 tablets daily)    verapamil  (CALAN -SR) 240 MG CR tablet TAKE 1 TABLET BY MOUTH EVERY DAY    No facility-administered encounter medications on file as of 12/08/2023.    Allergies:  Allergies  Allergen Reactions   Amoxicillin     REACTION: hives   Codeine     REACTION: rash   Sulfonamide Derivatives     REACTION: angioedema   Erythromycin     REACTION: ? reaction   Vitamin B50 Complex [Balanced B-100]     Itching, burning in hands    Family History: Family History  Problem Relation Age of Onset   Diabetes Mother    Stroke Father 81       CVA,BLOOD CLOT TO BRAIN   CAD Father        BYPASS   Stroke Paternal Grandmother 42       MULTIPLE CVA'S; onset late70s   Cancer Neg Hx    Colon cancer Neg Hx    Esophageal cancer Neg Hx    Rectal cancer Neg Hx    Stomach cancer Neg Hx     Social History: Social History   Tobacco Use   Smoking status: Former    Types: E-cigarettes    Quit date: 01/23/2010    Years since quitting: 13.8   Smokeless tobacco: Never   Tobacco comments:    smoked 1974-2011, up to 1 ppd  Vaping Use   Vaping status: Every Day  Substance Use Topics   Alcohol use: Yes    Alcohol/week: 21.0 - 28.0 standard drinks of alcohol    Types: 21 - 28 Cans of beer per week   Drug use: No   Social History   Social History Narrative   Lives with wife.   Are you right handed or left handed? Right Handed    Are you currently employed ? No    What is your current occupation? Retired    Do you live at home alone? No    Who lives with you? Wife    What type of home do you live in: 1 story or 2 story? Lives in a two story home         Vital Signs:  BP 136/81   Pulse 78   Ht 5' 7 (1.702 m)   Wt 216 lb (98 kg)   SpO2 98%   BMI 33.83 kg/m    Neurological  Exam: MENTAL STATUS including orientation to time, place, person, recent and remote memory, attention span and concentration, language, and fund of knowledge is normal.  Speech is not dysarthric.  CRANIAL NERVES: II:  No visual field defects.     III-IV-VI: Pupils round and reactive to light  bilaterally.  Normal conjugate, extra-ocular eye movements in all directions of gaze.  No nystagmus.  Left Horner's with miotic pupil (reactive) and mild ptosis.    V:  Normal facial sensation.    VII:  Normal facial symmetry and movements.   VIII:  Normal hearing and vestibular function.   IX-X:  Normal palatal movement.   XI:  Normal shoulder shrug and head rotation.   XII:  Normal tongue strength and range of motion, no deviation or fasciculation.  MOTOR:  No atrophy, fasciculations or abnormal movements.  No pronator drift.   Upper Extremity:  Right  Left  Deltoid  5/5   5/5   Biceps  5/5   5/5   Triceps  5/5   5/5   Wrist extensors  5/5   5/5   Wrist flexors  5/5   5/5   Finger extensors  5/5   5/5   Finger flexors  5/5   5/5   Dorsal interossei  5/5   5/5   Tone (Ashworth scale)  0  0   Lower Extremity:  Right  Left  Hip flexors  5/5   5/5   Knee flexors  5/5   5/5   Knee extensors  5/5   5/5   Dorsiflexors  5/5   5/5   Plantarflexors  5/5   5/5   Toe extensors  5/5   5/5   Toe flexors  5/5   5/5   Tone (Ashworth scale)  0  0   MSRs:                                           Right        Left brachioradialis 2+  2+  biceps 2+  2+  triceps 2+  2+  patellar 3+  3+  ankle jerk 2+  2+  Hoffman no  no  plantar response down  down   SENSORY:  Normal and symmetric perception of light touch, pinprick, vibration, and temperature.  Romberg's sign absent.   COORDINATION/GAIT: Normal finger-to- nose-finger.  Intact rapid alternating movements bilaterally.  Gait mildly wide-based and stable, unassisted. Tandem and stressed gait intact.      Thank you for allowing me to participate in  patient's care.  If I can answer any additional questions, I would be pleased to do so.    Sincerely,    Aminata Buffalo K. Tobie, DO

## 2023-12-13 LAB — IMMUNOFIXATION ELECTROPHORESIS
IgG (Immunoglobin G), Serum: 1039 mg/dL (ref 600–1540)
IgM, Serum: 71 mg/dL (ref 50–300)
Immunoglobulin A: 292 mg/dL (ref 70–320)

## 2023-12-13 LAB — PROTEIN ELECTROPHORESIS, SERUM
Albumin ELP: 4 g/dL (ref 3.8–4.8)
Alpha 1: 0.3 g/dL (ref 0.2–0.3)
Alpha 2: 0.7 g/dL (ref 0.5–0.9)
Beta 2: 0.4 g/dL (ref 0.2–0.5)
Beta Globulin: 0.5 g/dL (ref 0.4–0.6)
Gamma Globulin: 0.9 g/dL (ref 0.8–1.7)
Total Protein: 6.8 g/dL (ref 6.1–8.1)

## 2023-12-13 LAB — B12 AND FOLATE PANEL
Folate: 21.1 ng/mL
Vitamin B-12: 289 pg/mL (ref 200–1100)

## 2023-12-13 LAB — COPPER, SERUM: Copper: 100 ug/dL (ref 70–175)

## 2023-12-13 LAB — VITAMIN B1: Vitamin B1 (Thiamine): 10 nmol/L (ref 8–30)

## 2023-12-15 ENCOUNTER — Ambulatory Visit: Payer: Self-pay | Admitting: Neurology

## 2023-12-15 DIAGNOSIS — M1711 Unilateral primary osteoarthritis, right knee: Secondary | ICD-10-CM | POA: Diagnosis not present

## 2023-12-22 ENCOUNTER — Ambulatory Visit (INDEPENDENT_AMBULATORY_CARE_PROVIDER_SITE_OTHER): Payer: Medicare HMO

## 2023-12-22 VITALS — BP 132/78 | HR 80 | Ht 64.0 in | Wt 213.6 lb

## 2023-12-22 DIAGNOSIS — Z0000 Encounter for general adult medical examination without abnormal findings: Secondary | ICD-10-CM

## 2023-12-22 NOTE — Patient Instructions (Addendum)
 Eric Dillon,  Thank you for taking the time for your Medicare Wellness Visit. I appreciate your continued commitment to your health goals. Please review the care plan we discussed, and feel free to reach out if I can assist you further.  Medicare recommends these wellness visits once per year to help you and your care team stay ahead of potential health issues. These visits are designed to focus on prevention, allowing your provider to concentrate on managing your acute and chronic conditions during your regular appointments.  Please note that Annual Wellness Visits do not include a physical exam. Some assessments may be limited, especially if the visit was conducted virtually. If needed, we may recommend a separate in-person follow-up with your provider.  Ongoing Care Seeing your primary care provider every 3 to 6 months helps us  monitor your health and provide consistent, personalized care.   Referrals If a referral was made during today's visit and you haven't received any updates within two weeks, please contact the referred provider directly to check on the status.  Recommended Screenings:  Health Maintenance  Topic Date Due   COVID-19 Vaccine (3 - Pfizer risk series) 07/29/2019   Flu Shot  10/24/2023   Eye exam for diabetics  01/29/2024   Hemoglobin A1C  03/09/2024   Yearly kidney function blood test for diabetes  09/07/2024   Yearly kidney health urinalysis for diabetes  09/07/2024   Complete foot exam   09/07/2024   Medicare Annual Wellness Visit  12/21/2024   DTaP/Tdap/Td vaccine (2 - Td or Tdap) 05/29/2025   Colon Cancer Screening  02/16/2030   Pneumococcal Vaccine for age over 25  Completed   Hepatitis C Screening  Completed   Zoster (Shingles) Vaccine  Completed   HPV Vaccine  Aged Out   Meningitis B Vaccine  Aged Out       12/22/2023   10:53 AM  Advanced Directives  Does Patient Have a Medical Advance Directive? Yes  Type of Estate agent of  [place];Living will  Copy of Healthcare Power of Attorney in Chart? No - copy requested   Advance Care Planning is important because it: Ensures you receive medical care that aligns with your values, goals, and preferences. Provides guidance to your family and loved ones, reducing the emotional burden of decision-making during critical moments.  Vision: Annual vision screenings are recommended for early detection of glaucoma, cataracts, and diabetic retinopathy. These exams can also reveal signs of chronic conditions such as diabetes and high blood pressure.  Dental: Annual dental screenings help detect early signs of oral cancer, gum disease, and other conditions linked to overall health, including heart disease and diabetes.

## 2023-12-22 NOTE — Progress Notes (Signed)
 Subjective:   Eric Dillon is a 68 y.o. who presents for a Medicare Wellness preventive visit.  As a reminder, Annual Wellness Visits don't include a physical exam, and some assessments may be limited, especially if this visit is performed virtually. We may recommend an in-person follow-up visit with your provider if needed.  Visit Complete: In person  Persons Participating in Visit: Patient.  AWV Questionnaire: Yes: Patient Medicare AWV questionnaire was completed by the patient on 12/19/2023; I have confirmed that all information answered by patient is correct and no changes since this date.  Cardiac Risk Factors include: advanced age (>31men, >99 women);diabetes mellitus;dyslipidemia;hypertension;male gender;obesity (BMI >30kg/m2)     Objective:    Today's Vitals   12/22/23 1053  BP: 132/78  Pulse: 80  SpO2: 98%  Weight: 213 lb 9.6 oz (96.9 kg)  Height: 5' 4 (1.626 m)   Body mass index is 36.66 kg/m.     12/22/2023   10:53 AM 12/08/2023    2:45 PM 12/19/2022   11:15 AM 11/05/2022    3:10 PM  Advanced Directives  Does Patient Have a Medical Advance Directive? Yes Yes Yes Yes  Type of Estate agent of [place];Living will Living will;Healthcare Power of [employer] Power of [place];Living will Healthcare Power of [place];Living will  Copy of Healthcare Power of Attorney in Chart? No - copy requested  No - copy requested     Current Medications (verified) Outpatient Encounter Medications as of 12/22/2023  Medication Sig   aspirin EC 81 MG tablet Take 81 mg by mouth daily.   celecoxib  (CELEBREX ) 200 MG capsule Take 1 capsule (200 mg total) by mouth 2 (two) times daily.   esomeprazole  (NEXIUM ) 40 MG capsule Take 1 capsule (40 mg total) by mouth daily before breakfast. (Patient taking differently: Take 20 mg by mouth daily. Take 2 tablets daily)   hydrochlorothiazide  (MICROZIDE ) 12.5 MG capsule TAKE 1 CAPSULE BY MOUTH EVERY DAY    prednisoLONE acetate (PRED FORTE) 1 % ophthalmic suspension Place 1 drop into the left eye daily.   ramipril  (ALTACE ) 2.5 MG capsule TAKE 1 CAPSULE BY MOUTH EVERY DAY   rosuvastatin  (CRESTOR ) 40 MG tablet TAKE 1 TABLET BY MOUTH EVERY DAY   triamcinolone  cream (KENALOG ) 0.1 % Apply 1 application topically 2 (two) times daily.   valACYclovir (VALTREX) 1000 MG tablet Take 1,000 mg by mouth daily.   verapamil  (CALAN -SR) 240 MG CR tablet TAKE 1 TABLET BY MOUTH EVERY DAY   No facility-administered encounter medications on file as of 12/22/2023.    Allergies (verified) Amoxicillin, Codeine, Sulfonamide derivatives, Erythromycin, and Vitamin b50 complex [balanced b-100]   History: Past Medical History:  Diagnosis Date   Barrett's esophagus - RESOLVED 2008   RESOLVED ON SUBSEQUENT EXAMS   Carotid artery dissection 2011   L   Diverticulosis    GERD (gastroesophageal reflux disease)    Goiter    Hemorrhoids    Horner's syndrome 2012   Hyperlipidemia    Hypertension    Personal history of colonic adenomas 01/14/2008   Prostate cancer (HCC) 2010   Stroke (HCC) 1996   FROM DISSECTING R CAROID ARTERY    Past Surgical History:  Procedure Laterality Date   BIOPSY THYROID   2007   colloid  admixed with scant follicular epithelium   CAROTID ARTERY ANGIOPLASTY  2011   L   COLONOSCOPY     COLONOSCOPY W/ BIOPSIES AND POLYPECTOMY  multiple   adenomatous polyps, internal hemorrhoids, diverticulosis   CORNEAL  TRANSPLANT Left 2015    corneal pannus & perforation,[facility]   DISSECTION OF CAROID ARTERY     WITH CVA   KNEE SURGERY     RIGHT..TO STRIGHTEN PATELLA   ROBOTIC PROSTATECTOMY  2011   Dr Noretta Ferrara   UPPER GASTROINTESTINAL ENDOSCOPY  multiple   w/dilation, Barrett's , esophageal ring, hiatal hernia, gastritis   Family History  Problem Relation Age of Onset   Diabetes Mother    Stroke Father 68       CVA,BLOOD CLOT TO BRAIN   CAD Father        BYPASS   Stroke Paternal Grandmother 104        MULTIPLE CVA'S; onset late70s   Cancer Neg Hx    Colon cancer Neg Hx    Esophageal cancer Neg Hx    Rectal cancer Neg Hx    Stomach cancer Neg Hx    Social History   Socioeconomic History   Marital status: Married    Spouse name: Nathanel   Number of children: 2   Years of education: Not on file   Highest education level: Not on file  Occupational History   Occupation: Systems developer   Occupation: Retired  Tobacco Use   Smoking status: Former    Types: E-cigarettes    Quit date: 01/23/2010    Years since quitting: 13.9   Smokeless tobacco: Never   Tobacco comments:    smoked 1974-2011, up to 1 ppd  Vaping Use   Vaping status: Every Day  Substance and Sexual Activity   Alcohol use: Yes    Alcohol/week: 21.0 - 28.0 standard drinks of alcohol    Types: 21 - 28 Cans of beer per week   Drug use: No   Sexual activity: Yes  Other Topics Concern   Not on file  Social History Narrative   Lives with wife.   Are you right handed or left handed? Right Handed    Are you currently employed ? No    What is your current occupation? Retired    Do you live at home alone? No    Who lives with you? Wife    What type of home do you live in: 1 story or 2 story? Lives in a two story home        Social Drivers of Health   Financial Resource Strain: Low Risk  (12/22/2023)   Overall Financial Resource Strain (CARDIA)    Difficulty of Paying Living Expenses: Not hard at all  Food Insecurity: No Food Insecurity (12/22/2023)   Hunger Vital Sign    Worried About Running Out of Food in the Last Year: Never true    Ran Out of Food in the Last Year: Never true  Transportation Needs: No Transportation Needs (12/22/2023)   PRAPARE - Administrator, Civil Service (Medical): No    Lack of Transportation (Non-Medical): No  Physical Activity: Inactive (12/22/2023)   Exercise Vital Sign    Days of Exercise per Week: 0 days    Minutes of Exercise per Session: 0 min  Stress: No Stress Concern  Present (12/22/2023)   [employer] of Occupational Health - Occupational Stress Questionnaire    Feeling of Stress: Not at all  Social Connections: Moderately Integrated (12/22/2023)   Social Connection and Isolation Panel    Frequency of Communication with Friends and Family: Once a week    Frequency of Social Gatherings with Friends and Family: Once a week    Attends Religious Services:  More than 4 times per year    Active Member of Clubs or Organizations: No    Attends Banker Meetings: 1 to 4 times per year    Marital Status: Married    Tobacco Counseling Counseling given: Not Answered Tobacco comments: smoked 1974-2011, up to 1 ppd    Clinical Intake:  Pre-visit preparation completed: Yes  Pain : No/denies pain     BMI - recorded: 36.66 Nutritional Status: BMI > 30  Obese Nutritional Risks: None Diabetes: Yes CBG done?: No Did pt. bring in CBG monitor from home?: No  Lab Results  Component Value Date   HGBA1C 6.7 (H) 09/08/2023   HGBA1C 6.8 (H) 03/10/2023   HGBA1C 6.5 09/04/2022     How often do you need to have someone help you when you read instructions, pamphlets, or other written materials from your doctor or pharmacy?: 1 - Never  Interpreter Needed?: No  Information entered by :: Verdie Saba, CMA   Activities of Daily Living     12/22/2023   10:55 AM 12/19/2023   10:50 AM  In your present state of health, do you have any difficulty performing the following activities:  Hearing? 0 0  Vision? 0 0  Difficulty concentrating or making decisions? 0 0  Walking or climbing stairs? 0 0  Dressing or bathing? 0 0  Doing errands, shopping? 0 0  Preparing Food and eating ? N N  Using the Toilet? N N  In the past six months, have you accidently leaked urine? CINDERELLA CINDERELLA  Comment wears a pad   Do you have problems with loss of bowel control? N N  Managing your Medications? N N  Managing your Finances? N N  Housekeeping or managing your  Housekeeping? N N    Patient Care Team: Geofm Glade PARAS, MD as PCP - General (Internal Medicine) Donnice Sickles, MD ([facility])  I have updated your Care Teams any recent Medical Services you may have received from other providers in the past year.     Assessment:   This is a routine wellness examination for [place].  Hearing/Vision screen Hearing Screening - Comments:: Denies hearing difficulties   Vision Screening - Comments:: Wears rx glasses - up to date with routine eye exams with Dr Sickles   Goals Addressed               This Visit's Progress     Patient Stated (pt-stated)        Patient stated he plans to have right knee surgery       Depression Screen     12/22/2023   10:56 AM 09/08/2023    1:06 PM 03/10/2023    1:05 PM 12/19/2022   11:16 AM 05/17/2022    1:43 PM 02/28/2022   11:01 AM 02/28/2022   11:00 AM  PHQ 2/9 Scores  PHQ - 2 Score 0 0 0 0 0 0 0  PHQ- 9 Score 1 0 0 1  0     Fall Risk     12/22/2023   10:55 AM 12/19/2023   10:50 AM 12/08/2023    2:45 PM 09/08/2023    1:06 PM 03/10/2023    1:05 PM  Fall Risk   Falls in the past year? 0 0 0 0 0  Number falls in past yr: 0 0 0 0 0  Injury with Fall? 0 0 0 0 0  Risk for fall due to : No Fall Risks   No Fall Risks  No Fall Risks  Follow up Falls evaluation completed;Falls prevention discussed  Falls evaluation completed Falls evaluation completed Falls evaluation completed    MEDICARE RISK AT HOME:  Medicare Risk at Home Any stairs in or around the home?: Yes If so, are there any without handrails?: No Home free of loose throw rugs in walkways, pet beds, electrical cords, etc?: Yes Adequate lighting in your home to reduce risk of falls?: Yes Life alert?: No Use of a cane, walker or w/c?: No Grab bars in the bathroom?: Yes Shower chair or bench in shower?: Yes Elevated toilet seat or a handicapped toilet?: No  TIMED UP AND GO:  Was the test performed?  No  Cognitive Function:  6CIT completed        12/22/2023   10:57 AM 12/19/2022   11:15 AM  6CIT Screen  What Year? 0 points 0 points  What month? 0 points 0 points  What time? 0 points 0 points  Count back from 20 0 points 0 points  Months in reverse 0 points 0 points  Repeat phrase 0 points 0 points  Total Score 0 points 0 points    Immunizations Immunization History  Administered Date(s) Administered   Fluad Quad(high Dose 65+) 12/29/2022   INFLUENZA, HIGH DOSE SEASONAL PF 12/05/2021   Influenza Whole 01/24/2010   Influenza, Seasonal, Injecte, Preservative Fre 04/01/2012   Influenza,inj,Quad PF,6+ Mos 12/13/2017, 12/08/2018, 12/05/2020   Influenza,inj,Quad PF,6-35 Mos 12/13/2017   Influenza-Unspecified 01/06/2013, 01/11/2014, 02/14/2016, 12/21/2016, 12/11/2019   PFIZER(Purple Top)SARS-COV-2 Vaccination 06/10/2019, 07/01/2019   PNEUMOCOCCAL CONJUGATE-20 03/01/2022   Pneumococcal Polysaccharide-23 06/26/2016   Tdap 05/30/2015   Zoster Recombinant(Shingrix ) 10/29/2017, 12/29/2017    Screening Tests Health Maintenance  Topic Date Due   COVID-19 Vaccine (3 - Pfizer risk series) 07/29/2019   Influenza Vaccine  10/24/2023   OPHTHALMOLOGY EXAM  01/29/2024   HEMOGLOBIN A1C  03/09/2024   Diabetic kidney evaluation - eGFR measurement  09/07/2024   Diabetic kidney evaluation - Urine ACR  09/07/2024   FOOT EXAM  09/07/2024   Medicare Annual Wellness (AWV)  12/21/2024   DTaP/Tdap/Td (2 - Td or Tdap) 05/29/2025   Colonoscopy  02/16/2030   Pneumococcal Vaccine: 50+ Years  Completed   Hepatitis C Screening  Completed   Zoster Vaccines- Shingrix   Completed   HPV VACCINES  Aged Out   Meningococcal B Vaccine  Aged Out    Health Maintenance Items Addressed:  Pl[facility] get Influ[facility].  Additional Screening:  Vision Screening: Recommended annual ophthalmology exams for early detection of glaucoma and other disorders of the eye. Is the patient up to date with their  annual eye exam?  Yes - next appt in 01/2024 Who is the provider or what is the name of the office in which the patient attends annual eye exams? Dr MatthewGiegengack  Dental Screening: Recommended annual dental exams for proper oral hygiene  Community Resource Referral / Chronic Care Management: CRR required this visit?  No   CCM required this visit?  No   Plan:    I have personally reviewed and noted the following in the patient's chart:   Medical and social history Use of alcohol, tobacco or illicit drugs  Current medications and supplements including opioid prescriptions. Patient is not currently taking opioid prescriptions. Functional ability and status Nutritional status Physical activity Advanced directives List of other physicians Hospitalizations, surgeries, and ER visits in previous 12 months Vitals Screenings to include cognitive, depression, and falls Referrals and appointments  In addition, I have reviewed and discussed with patient certain preventive protocols, quality metrics, and best practice recommendations. A written personalized care plan for preventive services as well as general preventive health recommendations were provided to patient.   Verdie CHRISTELLA Saba, CMA   12/22/2023   After Visit Summary: (In Person-Declined) Patient declined AVS at this time.  Notes: Nothing significant to report at this time.

## 2023-12-23 ENCOUNTER — Telehealth: Payer: Self-pay

## 2023-12-23 ENCOUNTER — Telehealth: Payer: Self-pay | Admitting: Internal Medicine

## 2023-12-23 NOTE — Telephone Encounter (Signed)
   Name: Eric Dillon  DOB: 10-07-1955  MRN: [id number]  Primary Cardiologist: Darryle ONEIDA Decent, MD  Chart reviewed as part of pre-operative protocol coverage. Because of Heliodoro Domagalski Seiden's past medical history and time since last visit, he will require a follow-up in-office visit in order to better assess preoperative cardiovascular risk.  Pre-op covering staff: - Please schedule appointment and call patient to inform them. If patient already had an upcoming appointment within acceptable timeframe, please add pre-op clearance to the appointment notes so provider is aware. - Please contact requesting surgeon's office via preferred method (i.e, phone, fax) to inform them of need for appointment prior to surgery.  Please move up his appointment with Dr. Decent which is currently scheduled in November.  Surgical date 01/20/2024  Scot Ford, [place]  12/23/2023, 11:37 AM

## 2023-12-23 NOTE — Telephone Encounter (Signed)
   Pre-operative Risk Assessment    Patient Name: Eric Dillon  DOB: 1955-10-02 MRN: [id number]   Date of last office visit: 06/19/22 DARRYLE DECENT, MD Date of next office visit: 02/02/24 DARRYLE DECENT, MD   Request for Surgical Clearance    Procedure:  RIGHT TOTAL KNEE ARTHROPLASTY  Date of Surgery:  Clearance 01/20/24                                Surgeon:  DR DONNICE CAR Surgeon's Group or Practice Name:  [facility] Phone number:  [phone] Fax number:  [phone]  ATTN: JOEN WILLS   Type of Clearance Requested:   - Medical  - Pharmacy:  Hold Aspirin     Type of Anesthesia:  Spinal   Additional requests/questions:    SignedLucie DELENA Ku   12/23/2023, 11:25 AM

## 2023-12-23 NOTE — Telephone Encounter (Signed)
 Left message for the pt to call back to schedule a sooner appt for preop clearance. I have reviewed Dr. Barbaraann schedules and he at this time does not have anything sooner in time for preop clearance.   Left message we can schedule preop appt in office with APP and he can keep his appt as planned with Dr. Barbaraann in 01/2024.

## 2023-12-23 NOTE — Telephone Encounter (Signed)
 Surgical clearance form received from [facility] via fax and placed in provider's box up front.

## 2023-12-24 DIAGNOSIS — M1711 Unilateral primary osteoarthritis, right knee: Secondary | ICD-10-CM | POA: Diagnosis not present

## 2023-12-24 DIAGNOSIS — M25561 Pain in right knee: Secondary | ICD-10-CM | POA: Diagnosis not present

## 2023-12-24 NOTE — Telephone Encounter (Signed)
 Patient has appointment scheduled with Lum Louis, NP clearance can be addressed at ov note has been made to appointment line that preop clearance is needed

## 2023-12-25 NOTE — Telephone Encounter (Signed)
Clearance faxed today 

## 2024-01-02 ENCOUNTER — Encounter: Payer: Self-pay | Admitting: *Deleted

## 2024-01-05 ENCOUNTER — Ambulatory Visit: Attending: Emergency Medicine | Admitting: Emergency Medicine

## 2024-01-05 ENCOUNTER — Encounter: Payer: Self-pay | Admitting: Emergency Medicine

## 2024-01-05 VITALS — BP 128/76 | HR 76 | Ht 67.0 in | Wt 217.0 lb

## 2024-01-05 DIAGNOSIS — I773 Arterial fibromuscular dysplasia: Secondary | ICD-10-CM

## 2024-01-05 DIAGNOSIS — Z01810 Encounter for preprocedural cardiovascular examination: Secondary | ICD-10-CM

## 2024-01-05 DIAGNOSIS — I7771 Dissection of carotid artery: Secondary | ICD-10-CM

## 2024-01-05 DIAGNOSIS — R931 Abnormal findings on diagnostic imaging of heart and coronary circulation: Secondary | ICD-10-CM

## 2024-01-05 DIAGNOSIS — E782 Mixed hyperlipidemia: Secondary | ICD-10-CM | POA: Diagnosis not present

## 2024-01-05 DIAGNOSIS — I6521 Occlusion and stenosis of right carotid artery: Secondary | ICD-10-CM | POA: Diagnosis not present

## 2024-01-05 DIAGNOSIS — I10 Essential (primary) hypertension: Secondary | ICD-10-CM

## 2024-01-05 DIAGNOSIS — I2510 Atherosclerotic heart disease of native coronary artery without angina pectoris: Secondary | ICD-10-CM | POA: Diagnosis not present

## 2024-01-05 NOTE — Patient Instructions (Addendum)
 Medication Instructions:  NO CHANGES  Lab Work: NONE TO BE DONE TODAY.  Testing/Procedures: Your physician has requested that you have a carotid duplex. This test is an ultrasound of the carotid arteries in your neck. It looks at blood flow through these arteries that supply the brain with blood. Allow one hour for this exam. There are no restrictions or special instructions.   Follow-Up: At [facility], you and your health needs are our priority.  As part of our continuing mission to provide you with exceptional heart care, our providers are all part of one team.  This team includes your primary Cardiologist (physician) and Advanced Practice Providers or APPs (Physician Assistants and Nurse Practitioners) who all work together to provide you with the care you need, when you need it.  Your next appointment:   1 YEAR  Provider:   Darryle ONEIDA Decent, MD

## 2024-01-05 NOTE — Progress Notes (Unsigned)
    Subjective:    Patient ID: Eric Dillon, male    DOB: 02-Dec-1955, 69 y.o.   MRN: [id number]     HPI Eric Dillon is here for pre-operative clearance at the request of Dr Ernie for total knee arthroplasty scheduled for 01/20/2024.   Eric Dillon denies any personal or family history of problems with anesthesia or bleeding/blood clot problems.    Eric Dillon has no concerns and is taking all prescribed medication as prescribed.   Eric Dillon is exercising regularly.  With their daily activities they denies chest pain, palpitations, SOB and lightheadedness.         Medications and allergies reviewed with patient and updated if appropriate.  Current Outpatient Medications on File Prior to Visit  Medication Sig Dispense Refill   aspirin EC 81 MG tablet Take 81 mg by mouth daily.     celecoxib  (CELEBREX ) 200 MG capsule Take 1 capsule (200 mg total) by mouth 2 (two) times daily. 60 capsule 0   esomeprazole  (NEXIUM ) 40 MG capsule Take 1 capsule (40 mg total) by mouth daily before breakfast. (Patient taking differently: Take 20 mg by mouth daily. Take 2 tablets daily) 90 capsule 3   hydrochlorothiazide  (MICROZIDE ) 12.5 MG capsule TAKE 1 CAPSULE BY MOUTH EVERY DAY 90 capsule 1   prednisoLONE acetate (PRED FORTE) 1 % ophthalmic suspension Place 1 drop into the left eye daily.     ramipril  (ALTACE ) 2.5 MG capsule TAKE 1 CAPSULE BY MOUTH EVERY DAY 90 capsule 3   rosuvastatin  (CRESTOR ) 40 MG tablet TAKE 1 TABLET BY MOUTH EVERY DAY 90 tablet 3   triamcinolone  cream (KENALOG ) 0.1 % Apply 1 application topically 2 (two) times daily. 30 g 0   valACYclovir (VALTREX) 1000 MG tablet Take 1,000 mg by mouth daily.     verapamil  (CALAN -SR) 240 MG CR tablet TAKE 1 TABLET BY MOUTH EVERY DAY 90 tablet 2   No current facility-administered medications on file prior to visit.    Review of Systems     Objective:  There were no vitals filed for this visit. There were no vitals filed for this visit. There is no height  or weight on file to calculate BMI.  BP Readings from Last 3 Encounters:  01/05/24 128/76  12/22/23 132/78  12/08/23 136/81    Wt Readings from Last 3 Encounters:  01/05/24 217 lb (98.4 kg)  12/22/23 213 lb 9.6 oz (96.9 kg)  12/08/23 216 lb (98 kg)      Physical Exam Constitutional: He appears well-developed and well-nourished. No distress.  HENT:  Head: Normocephalic and atraumatic.  Right Ear: External ear normal.  Left Ear: External ear normal.  Mouth/Throat: Oropharynx is clear and moist. Eyes: Conjunctivae and EOM are normal.  Neck: Neck supple. No tracheal deviation present. No thyromegaly present.  No carotid bruit  Cardiovascular: Normal rate, regular rhythm, normal heart sounds and intact distal pulses.   No murmur heard.  No lower extremity edema. Pulmonary/Chest: Effort normal and breath sounds normal. No respiratory distress. He has no wheezes. He has no rales.  Abdominal: Soft. He exhibits no distension. There is no tenderness.  Lymphadenopathy:   He has no cervical adenopathy.  Skin: Skin is warm and dry. He is not diaphoretic.  Psychiatric: He has a normal mood and affect. His behavior is normal.         Assessment & Plan:       See Problem List for Assessment and Plan of chronic medical problems.

## 2024-01-05 NOTE — Progress Notes (Signed)
 Sent message, via epic in basket, requesting orders in epic from Careers adviser.

## 2024-01-05 NOTE — Progress Notes (Unsigned)
 Cardiology Office Note:    Date:  01/06/2024  ID:  Eric Dillon, DOB 17-May-1955, MRN [id number] PCP: Geofm Glade PARAS, MD  [facility] Providers Cardiologist:  Darryle ONEIDA Decent, MD Cardiology APP:  Rana Lum CROME, NP       Patient Profile:       Chief Complaint: 1 year follow-up History of Present Illness:  Eric Dillon is a 68 y.o. 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  Carotid duplex 02/2019: Showed right ICA consistent with 1-39% stenosis.  Lexiscan  Myoview  03/2020 was normal low risk study.  CT cardiac scoring 03/2020 showed coronary calcium  score 253 (76 percentile).  Carotid duplex 11/2021 showed right ICA consistent 1-39% stenosis and no evidence of stenosis in the left ICA.  He was last seen in clinic on 06/19/2022 by Dr. Decent.  He was doing well at the time without acute cardiovascular concerns.  No symptoms of angina.  He was to follow-up in 1 year.  He is now pending right total knee arthroplasty on 01/20/2024 with [facility].   Discussed the use of AI scribe software for clinical note transcription with the patient, who gave verbal consent to proceed.  History of Present Illness Eric Dillon is a 68 year old male who presents for 1 year follow-up and preoperative clearance.  Today patient is doing well overall.  He is without acute cardiovascular concerns or complaints today.  No chest pains, dyspnea, lightheadedness, dizziness, or syncope, palpitations.  He reports limited physical activity due to knee pain but can perform some activities like mowing the lawn and walking up stairs, though these cause knee pain.  He monitors his blood pressure at home, with recent readings around 120s over 70s.  His LDL cholesterol was 60 in June 2025, within  the target range for his condition.  He remains adherent to his current medication regimen.    Review of systems:  Please see the history of present illness. All other systems are reviewed and otherwise negative.      Studies Reviewed:    EKG Interpretation Date/Time:  Monday January 05 2024 14:11:59 EDT Ventricular Rate:  76 PR Interval:  158 QRS Duration:  82 QT Interval:  380 QTC Calculation: 427 R Axis:   -37  Text Interpretation: Normal sinus rhythm Left axis deviation When compared with ECG of 06-May-2008 08:06, QRS axis Shifted left Confirmed by Rana Lum [phone]) on 01/06/2024 3:41:49 PM    Carotid duplex 12/10/2021 Right Carotid: Velocities in the right ICA are consistent with a 1-39%  stenosis.   Left Carotid: There is no evidence of stenosis in the left ICA. The  extracranial               vessels were near-normal with only minimal wall thickening  or               plaque.   Vertebrals:  Bilateral vertebral arteries demonstrate antegrade flow.  Subclavians: Normal flow hemodynamics were seen in bilateral subclavian               arteries.   Renal artery ultrasound 07/07/2020 Largest Aortic Diameter: 2.2 cm    Renal:    Right: Normal size right kidney. Normal right Resisitive Index.         Normal cortical thickness of right kidney. No evidence  of         right renal artery stenosis. RRV flow present.  Left:  Normal size of left kidney. Normal left Resistive Index.         Normal cortical thickness of the left kidney. No evidence of         left renal artery stenosis. LRV flow present.  Mesenteric:  Normal Celiac artery and Superior Mesenteric artery findings.   Lexiscan  Myoview  04/13/2020 The left ventricular ejection fraction is normal (55-65%). Nuclear stress EF: 55%. There was no ST segment deviation noted during stress. The study is normal. This is a low risk study. There is no evidence of ischemia or infarction.  CT cardiac scoring  04/05/2020 Coronary calcium  score of 253. This was 76th percentile for age and sex matched controls.  Carotid duplex 03/07/2020 Right Carotid: Velocities in the right ICA are consistent with a 1-39%  stenosis.   Left Carotid: There is no evidence of stenosis in the left ICA. The  extracranial               vessels were near-normal with only minimal wall thickening  or               plaque.   Vertebrals:  Bilateral vertebral arteries demonstrate antegrade flow.  Subclavians: Normal flow hemodynamics were seen in bilateral subclavian               arteries.  Risk Assessment/Calculations:              Physical Exam:   VS:  BP 128/76 (BP Location: Left Arm, Patient Position: Sitting, Cuff Size: Normal)   Pulse 76   Ht 5' 7 (1.702 m)   Wt 217 lb (98.4 kg)   BMI 33.99 kg/m    Wt Readings from Last 3 Encounters:  01/06/24 218 lb (98.9 kg)  01/05/24 217 lb (98.4 kg)  12/22/23 213 lb 9.6 oz (96.9 kg)    GEN: Well nourished, well developed in no acute distress NECK: No JVD; No carotid bruits CARDIAC: RRR, no murmurs, rubs, gallops RESPIRATORY:  Clear to auscultation without rales, wheezing or rhonchi  ABDOMEN: Soft, non-tender, non-distended EXTREMITIES:  No edema; No acute deformity      Assessment and Plan:  Coronary artery disease CT cardiac scoring 03/2020 showed coronary calcium  score 253 (76th percentile) Lexiscan  Myoview  03/2020 was normal low risk study without evidence of ischemia or infarction - Today he is stable without chest pains.  Remains active without exertional symptoms.  No indication for further ischemic evaluation at this time - Continue aspirin 81 mg daily and rosuvastatin  40 mg daily  Carotid artery disease Fibromuscular disease History of bilateral carotid dissection with residual left Horners syndrome Carotid artery duplex 11/2021 showed right ICA consistent 1-39% stenosis and no evidence of stenosis in left ICA - No new neurological symptoms today -  FMD seems to be stable - Plan for carotid artery duplex for routine monitoring  Hypertension Blood pressure today is 128/76 and well-controlled - Continue hydrochlorothiazide  12.5 mg daily, verapamil  240 mg daily, and ramipril  2.5 mg daily  Hyperlipidemia LDL 63 on 08/2023 and well-controlled - Continue rosuvastatin  40 mg daily  Preoperative cardiovascular exam According to the Revised Cardiac Risk Index (RCRI), his Perioperative Risk of Major Cardiac Event is (%): 0.9. His Functional Capacity in METs is: 5.62 according to the Duke Activity Status Index (DASI).  Therefore, based on ACC/AHA guidelines, patient would be at acceptable risk for the planned procedure without further cardiovascular  testing. I will route this recommendation to the requesting party via Epic fax function.  He may hold aspirin for 5-7 days prior to procedure. Please resume aspirin as soon as possible postprocedure, at the discretion of the surgeon.        Dispo:  Return in about 1 year (around 01/04/2025).  Signed, Lum LITTIE Louis, NP

## 2024-01-06 ENCOUNTER — Ambulatory Visit (INDEPENDENT_AMBULATORY_CARE_PROVIDER_SITE_OTHER): Admitting: Internal Medicine

## 2024-01-06 ENCOUNTER — Encounter: Payer: Self-pay | Admitting: Emergency Medicine

## 2024-01-06 ENCOUNTER — Encounter: Payer: Self-pay | Admitting: Internal Medicine

## 2024-01-06 ENCOUNTER — Ambulatory Visit

## 2024-01-06 VITALS — BP 130/78 | HR 60 | Temp 98.1°F | Ht 67.0 in | Wt 218.0 lb

## 2024-01-06 DIAGNOSIS — Z8673 Personal history of transient ischemic attack (TIA), and cerebral infarction without residual deficits: Secondary | ICD-10-CM

## 2024-01-06 DIAGNOSIS — E785 Hyperlipidemia, unspecified: Secondary | ICD-10-CM | POA: Diagnosis not present

## 2024-01-06 DIAGNOSIS — E1169 Type 2 diabetes mellitus with other specified complication: Secondary | ICD-10-CM | POA: Diagnosis not present

## 2024-01-06 DIAGNOSIS — I152 Hypertension secondary to endocrine disorders: Secondary | ICD-10-CM

## 2024-01-06 DIAGNOSIS — Z01818 Encounter for other preprocedural examination: Secondary | ICD-10-CM

## 2024-01-06 DIAGNOSIS — K222 Esophageal obstruction: Secondary | ICD-10-CM

## 2024-01-06 DIAGNOSIS — E66811 Obesity, class 1: Secondary | ICD-10-CM | POA: Diagnosis not present

## 2024-01-06 DIAGNOSIS — E1159 Type 2 diabetes mellitus with other circulatory complications: Secondary | ICD-10-CM

## 2024-01-06 DIAGNOSIS — K429 Umbilical hernia without obstruction or gangrene: Secondary | ICD-10-CM | POA: Diagnosis not present

## 2024-01-06 DIAGNOSIS — K219 Gastro-esophageal reflux disease without esophagitis: Secondary | ICD-10-CM | POA: Diagnosis not present

## 2024-01-06 DIAGNOSIS — E0590 Thyrotoxicosis, unspecified without thyrotoxic crisis or storm: Secondary | ICD-10-CM

## 2024-01-06 LAB — CBC WITH DIFFERENTIAL/PLATELET
Basophils Absolute: 0.1 K/uL (ref 0.0–0.1)
Basophils Relative: 0.8 % (ref 0.0–3.0)
Eosinophils Absolute: 0.2 K/uL (ref 0.0–0.7)
Eosinophils Relative: 1.8 % (ref 0.0–5.0)
HCT: 43.8 % (ref 39.0–52.0)
Hemoglobin: 14.7 g/dL (ref 13.0–17.0)
Lymphocytes Relative: 15.6 % (ref 12.0–46.0)
Lymphs Abs: 1.4 K/uL (ref 0.7–4.0)
MCHC: 33.5 g/dL (ref 30.0–36.0)
MCV: 105.1 fl — ABNORMAL HIGH (ref 78.0–100.0)
Monocytes Absolute: 0.7 K/uL (ref 0.1–1.0)
Monocytes Relative: 8.1 % (ref 3.0–12.0)
Neutro Abs: 6.4 K/uL (ref 1.4–7.7)
Neutrophils Relative %: 73.7 % (ref 43.0–77.0)
Platelets: 240 K/uL (ref 150.0–400.0)
RBC: 4.16 Mil/uL — ABNORMAL LOW (ref 4.22–5.81)
RDW: 12.4 % (ref 11.5–15.5)
WBC: 8.7 K/uL (ref 4.0–10.5)

## 2024-01-06 LAB — LIPID PANEL
Cholesterol: 148 mg/dL (ref 0–200)
HDL: 60.4 mg/dL (ref >39.00)
LDL Cholesterol: 54 mg/dL (ref 0–99)
NonHDL: 88.05
Total CHOL/HDL Ratio: 2
Triglycerides: 171 mg/dL — ABNORMAL HIGH (ref 0.0–149.0)
VLDL: 34.2 mg/dL (ref 0.0–40.0)

## 2024-01-06 LAB — COMPREHENSIVE METABOLIC PANEL WITH GFR
ALT: 27 U/L (ref 0–53)
AST: 19 U/L (ref 0–37)
Albumin: 4.3 g/dL (ref 3.5–5.2)
Alkaline Phosphatase: 63 U/L (ref 39–117)
BUN: 23 mg/dL (ref 6–23)
CO2: 30 meq/L (ref 19–32)
Calcium: 9.5 mg/dL (ref 8.4–10.5)
Chloride: 100 meq/L (ref 96–112)
Creatinine, Ser: 1.01 mg/dL (ref 0.40–1.50)
GFR: 77.17 mL/min (ref >60.00)
Glucose, Bld: 176 mg/dL — ABNORMAL HIGH (ref 70–99)
Potassium: 4.2 meq/L (ref 3.5–5.1)
Sodium: 137 meq/L (ref 135–145)
Total Bilirubin: 0.4 mg/dL (ref 0.2–1.2)
Total Protein: 7 g/dL (ref 6.0–8.3)

## 2024-01-06 LAB — HEMOGLOBIN A1C: Hgb A1c MFr Bld: 6.5 % (ref 4.6–6.5)

## 2024-01-06 NOTE — Assessment & Plan Note (Signed)
Chronic GERD controlled Continue Nexium 20 mg twice daily

## 2024-01-06 NOTE — Assessment & Plan Note (Signed)
 Chronic medical problems stable Hypertension controlled, sugars controlled No concerning symptoms suggestive of CAD or respiratory disease Labs today - cbc, cmp, a1c EKG today from yesterday reviewed-stable Okay to hold aspirin 7 days prior to surgery and restart when able Low risk for low risk surgery -form sent to surgery

## 2024-01-06 NOTE — Assessment & Plan Note (Signed)
 Chronic With residual tinnitus Continue ASA 81 mg daily, crestor  40 mg daily BP controlled, sugars controlled

## 2024-01-06 NOTE — Assessment & Plan Note (Signed)
 Chronic Not able to exercise now due to knee pain-hopefully will be able to start walking again after surgery Diabetic diet

## 2024-01-06 NOTE — Assessment & Plan Note (Addendum)
 Chronic Blood pressure control CMP, cbc Continue hydrochlorothiazide  12.5 mg daily, ramipril  2.5 mg daily, verapamil  240 mg daily

## 2024-01-06 NOTE — Assessment & Plan Note (Signed)
 Chronic Associated with hyperlipidemia  Lab Results  Component Value Date   HGBA1C 6.7 (H) 09/08/2023   Sugars controlled Check A1c Eye exams up-to-date Continue lifestyle control Stressed regular exercise, diabetic diet

## 2024-01-06 NOTE — Assessment & Plan Note (Signed)
 Chronic Has seen endocrine in the past and given stability no treatment was necessary I will continue to monitor

## 2024-01-06 NOTE — Assessment & Plan Note (Signed)
 Chronic Regular exercise and healthy diet encouraged Check lipid panel, CMP Continue Crestor  40 mg daily  Lab Results  Component Value Date   LDLCALC 63 09/08/2023

## 2024-01-06 NOTE — Patient Instructions (Addendum)
      Blood work was ordered.       Medications changes include :   None     Return in about 6 months (around 07/06/2024) for follow up.

## 2024-01-06 NOTE — Assessment & Plan Note (Signed)
 Chronic Stable No pain, reducible Continue to monitor

## 2024-01-07 ENCOUNTER — Ambulatory Visit: Payer: Self-pay | Admitting: Internal Medicine

## 2024-01-07 NOTE — Patient Instructions (Signed)
 SURGICAL WAITING ROOM VISITATION  Patients having surgery or a procedure may have no more than 2 support people in the waiting area - these visitors may rotate.    Children under the age of 91 must have an adult with them who is not the patient.  Visitors with respiratory illnesses are discouraged from visiting and should remain at home.  If the patient needs to stay at the hospital during part of their recovery, the visitor guidelines for inpatient rooms apply. Pre-op nurse will coordinate an appropriate time for 1 support person to accompany patient in pre-op.  This support person may not rotate.    Please refer to the [facility] website for the visitor guidelines for Inpatients (after your surgery is over and you are in a regular room).       Your procedure is scheduled on: 01/20/24     Report to [facility] Main Entrance    Report to admitting at   [phone]   Call this number if you have problems the morning of surgery [phone]   Do not eat food :After Midnight.   After Midnight you may have the following liquids until  0415______ AM  DAY OF SURGERY  Water Non-Citrus Juices (without pulp, NO RED-Apple, White grape, White cranberry) Black Coffee (NO MILK/CREAM OR CREAMERS, sugar ok)  Clear Tea (NO MILK/CREAM OR CREAMERS, sugar ok) regular and decaf                             Plain Jell-O (NO RED)                                           Fruit ices (not with fruit pulp, NO RED)                                     Popsicles (NO RED)                                                               Sports drinks like Gatorade (NO RED)                   The day of surgery:  Drink ONE (1) Pre-Surgery Clear Ensure or G2 at  0415AM the morning of surgery. Drink in one sitting. Do not sip.  This drink was given to you during your hospital  pre-op appointment visit. Nothing else to drink after completing the  Pre-Surgery Clear Ensure or G2.          If you have  questions, please contact your surgeon's office.     Oral Hygiene is also important to reduce your risk of infection.                                    Remember - BRUSH YOUR TEETH THE MORNING OF SURGERY WITH YOUR REGULAR TOOTHPASTE  DENTURES WILL BE REMOVED PRIOR TO SURGERY PLEASE DO NOT APPLY Poly grip OR ADHESIVES!!!  Do NOT smoke after Midnight   Stop all vitamins and herbal supplements 7 days before surgery.   Take these medicines the morning of surgery with A SIP OF WATER: nexium , eye drops as usual , Calan  SR    DO NOT TAKE ANY ORAL DIABETIC MEDICATIONS DAY OF YOUR SURGERY  Bring CPAP mask and tubing day of surgery.                              You may not have any metal on your body including hair pins, jewelry, and body piercing             Do not wear make-up, lotions, powders, perfumes/cologne, or deodorant  Do not wear nail polish including gel and S&S, artificial/acrylic nails, or any other type of covering on natural nails including finger and toenails. If you have artificial nails, gel coating, etc. that needs to be removed by a nail salon please have this removed prior to surgery or surgery may need to be canceled/ delayed if the surgeon/ anesthesia feels like they are unable to be safely monitored.   Do not shave  48 hours prior to surgery.               Men may shave face and neck.   Do not bring valuables to the hospital. [place] [facility] IS NOT             RESPONSIBLE   FOR VALUABLES.   Contacts, glasses, dentures or bridgework may not be worn into surgery.   Bring small overnight bag day of surgery.   DO NOT BRING YOUR HOME MEDICATIONS TO THE HOSPITAL. PHARMACY WILL DISPENSE MEDICATIONS LISTED ON YOUR MEDICATION LIST TO YOU DURING YOUR ADMISSION IN THE HOSPITAL!    Patients discharged on the day of surgery will not be allowed to drive home.  Someone NEEDS to stay with you for the first 24 hours after anesthesia.   Special Instructions: Bring a copy of your  healthcare power of attorney and living will documents the day of surgery if you haven't scanned them before.              Please read over the following fact sheets you were given: IF YOU HAVE QUESTIONS ABOUT YOUR PRE-OP INSTRUCTIONS PLEASE CALL [phone].   If you received a COVID test during your pre-op visit  it is requested that you wear a mask when out in public, stay away from anyone that may not be feeling well and notify your surgeon if you develop symptoms. If you test positive for Covid or have been in contact with anyone that has tested positive in the last 10 days please notify you surgeon.      Pre-operative 4 CHG Bath Instructions   You can play a key role in reducing the risk of infection after surgery. Your skin needs to be as free of germs as possible. You can reduce the number of germs on your skin by washing with CHG (chlorhexidine gluconate) soap before surgery. CHG is an antiseptic soap that kills germs and continues to kill germs even after washing.   DO NOT use if you have an allergy to chlorhexidine/CHG or antibacterial soaps. If your skin becomes reddened or irritated, stop using the CHG and notify one of our RNs at [phone].   Please shower with the CHG soap starting 4 days before surgery using the following schedule:     Please keep  in mind the following:  DO NOT shave, including legs and underarms, starting the day of your first shower.   You may shave your face at any point before/day of surgery.  Place clean sheets on your bed the day you start using CHG soap. Use a clean washcloth (not used since being washed) for each shower. DO NOT sleep with pets once you start using the CHG.   CHG Shower Instructions:  If you choose to wash your hair and private area, wash first with your normal shampoo/soap.  After you use shampoo/soap, rinse your hair and body thoroughly to remove shampoo/soap residue.  Turn the water OFF and apply about 3 tablespoons (45 ml) of  CHG soap to a CLEAN washcloth.  Apply CHG soap ONLY FROM YOUR NECK DOWN TO YOUR TOES (washing for 3-5 minutes)  DO NOT use CHG soap on face, private areas, open wounds, or sores.  Pay special attention to the area where your surgery is being performed.  If you are having back surgery, having someone wash your back for you may be helpful. Wait 2 minutes after CHG soap is applied, then you may rinse off the CHG soap.  Pat dry with a clean towel  Put on clean clothes/pajamas   If you choose to wear lotion, please use ONLY the CHG-compatible lotions on the back of this paper.     Additional instructions for the day of surgery: DO NOT APPLY any lotions, deodorants, cologne, or perfumes.   Put on clean/comfortable clothes.  Brush your teeth.  Ask your nurse before applying any prescription medications to the skin.      CHG Compatible Lotions   Aveeno Moisturizing lotion  Cetaphil Moisturizing Cream  Cetaphil Moisturizing Lotion  Clairol Herbal Essence Moisturizing Lotion, Dry Skin  Clairol Herbal Essence Moisturizing Lotion, Extra Dry Skin  Clairol Herbal Essence Moisturizing Lotion, Normal Skin  Curel Age Defying Therapeutic Moisturizing Lotion with Alpha Hydroxy  Curel Extreme Care Body Lotion  Curel Soothing Hands Moisturizing Hand Lotion  Curel Therapeutic Moisturizing Cream, Fragrance-Free  Curel Therapeutic Moisturizing Lotion, Fragrance-Free  Curel Therapeutic Moisturizing Lotion, Original Formula  Eucerin Daily Replenishing Lotion  Eucerin Dry Skin Therapy Plus Alpha Hydroxy Crme  Eucerin Dry Skin Therapy Plus Alpha Hydroxy Lotion  Eucerin Original Crme  Eucerin Original Lotion  Eucerin Plus Crme Eucerin Plus Lotion  Eucerin TriLipid Replenishing Lotion  Keri Anti-Bacterial Hand Lotion  Keri Deep Conditioning Original Lotion Dry Skin Formula Softly Scented  Keri Deep Conditioning Original Lotion, Fragrance Free Sensitive Skin Formula  Keri Lotion Fast Absorbing  Fragrance Free Sensitive Skin Formula  Keri Lotion Fast Absorbing Softly Scented Dry Skin Formula  Keri Original Lotion  Keri Skin Renewal Lotion Keri Silky Smooth Lotion  Keri Silky Smooth Sensitive Skin Lotion  Nivea Body Creamy Conditioning Oil  Nivea Body Extra Enriched Teacher, adult education Moisturizing Lotion Nivea Crme  Nivea Skin Firming Lotion  NutraDerm 30 Skin Lotion  NutraDerm Skin Lotion  NutraDerm Therapeutic Skin Cream  NutraDerm Therapeutic Skin Lotion  ProShield Protective Hand Cream  Provon moisturizing lotion

## 2024-01-08 DIAGNOSIS — M1712 Unilateral primary osteoarthritis, left knee: Secondary | ICD-10-CM | POA: Diagnosis not present

## 2024-01-08 NOTE — Progress Notes (Signed)
 Second request for pre op orders in [facility] [place].

## 2024-01-12 ENCOUNTER — Other Ambulatory Visit: Payer: Self-pay

## 2024-01-12 ENCOUNTER — Encounter (HOSPITAL_COMMUNITY)
Admission: RE | Admit: 2024-01-12 | Discharge: 2024-01-12 | Disposition: A | Discharge status: Home / Self Care | Source: Ambulatory Visit | Attending: Orthopedic Surgery | Admitting: Orthopedic Surgery

## 2024-01-12 ENCOUNTER — Encounter (HOSPITAL_COMMUNITY): Payer: Self-pay

## 2024-01-12 VITALS — BP 152/96 | HR 58 | Temp 98.3°F | Resp 16 | Ht 67.0 in | Wt 210.0 lb

## 2024-01-12 DIAGNOSIS — Z8673 Personal history of transient ischemic attack (TIA), and cerebral infarction without residual deficits: Secondary | ICD-10-CM | POA: Diagnosis not present

## 2024-01-12 DIAGNOSIS — K219 Gastro-esophageal reflux disease without esophagitis: Secondary | ICD-10-CM | POA: Insufficient documentation

## 2024-01-12 DIAGNOSIS — I340 Nonrheumatic mitral (valve) insufficiency: Secondary | ICD-10-CM | POA: Diagnosis not present

## 2024-01-12 DIAGNOSIS — Z947 Corneal transplant status: Secondary | ICD-10-CM | POA: Diagnosis not present

## 2024-01-12 DIAGNOSIS — Z01818 Encounter for other preprocedural examination: Secondary | ICD-10-CM | POA: Insufficient documentation

## 2024-01-12 DIAGNOSIS — M1711 Unilateral primary osteoarthritis, right knee: Secondary | ICD-10-CM | POA: Diagnosis not present

## 2024-01-12 DIAGNOSIS — I10 Essential (primary) hypertension: Secondary | ICD-10-CM | POA: Diagnosis not present

## 2024-01-12 HISTORY — DX: Unspecified osteoarthritis, unspecified site: M19.90

## 2024-01-12 HISTORY — DX: Type 2 diabetes mellitus without complications: E11.9

## 2024-01-12 LAB — HEMOGLOBIN A1C
Hgb A1c MFr Bld: 6.1 % — ABNORMAL HIGH (ref 4.8–5.6)
Mean Plasma Glucose: 128.37 mg/dL

## 2024-01-12 LAB — BASIC METABOLIC PANEL WITH GFR
Anion gap: 11 (ref 5–15)
BUN: 17 mg/dL (ref 8–23)
CO2: 26 mmol/L (ref 22–32)
Calcium: 9.8 mg/dL (ref 8.9–10.3)
Chloride: 100 mmol/L (ref 98–111)
Creatinine, Ser: 0.86 mg/dL (ref 0.61–1.24)
GFR, Estimated: >60 mL/min (ref >60)
Glucose, Bld: 123 mg/dL — ABNORMAL HIGH (ref 70–99)
Potassium: 4.6 mmol/L (ref 3.5–5.1)
Sodium: 137 mmol/L (ref 135–145)

## 2024-01-12 LAB — CBC
HCT: 47.2 % (ref 39.0–52.0)
Hemoglobin: 15.5 g/dL (ref 13.0–17.0)
MCH: 34.2 pg — ABNORMAL HIGH (ref 26.0–34.0)
MCHC: 32.8 g/dL (ref 30.0–36.0)
MCV: 104.2 fL — ABNORMAL HIGH (ref 80.0–100.0)
Platelets: 286 K/uL (ref 150–400)
RBC: 4.53 MIL/uL (ref 4.22–5.81)
RDW: 11.8 % (ref 11.5–15.5)
WBC: 10.6 K/uL — ABNORMAL HIGH (ref 4.0–10.5)
nRBC: 0 % (ref 0.0–0.2)

## 2024-01-12 LAB — SURGICAL PCR SCREEN
MRSA, PCR: NEGATIVE
Staphylococcus aureus: NEGATIVE

## 2024-01-12 NOTE — Progress Notes (Addendum)
 Anesthesia Review:  PCP: Stacy burns LOV 01/06/24  Cardiologist : Darryle Decent  [place], NP LOV 01/05/24   PPM/ ICD: Device Orders: Rep Notified:  Chest x-ray : EKG : 01/05/24  Echo : 2021  Stress test: 2022  CT Card-2022  Cardiac Cath :   Activity level:  Sleep Study/ CPAP : Fasting Blood Sugar :      / Checks Blood Sugar -- times a day:     DM- type2 on no meds per pt does not check glucose at home  Hgba1c- 01/12/2024 -6.1  On no meds   PT uses electronic cigarettes   Blood Thinner/ Instructions /Last Dose: ASA / Instructions/ Last Dose :    81 mg aspirin

## 2024-01-13 NOTE — Anesthesia Preprocedure Evaluation (Addendum)
 Anesthesia Evaluation  Patient identified by MRN, date of birth, ID band Patient awake    Reviewed: Allergy & Precautions, NPO status , Patient's Chart, lab work & pertinent test results  History of Anesthesia Complications Negative for: history of anesthetic complications  Airway Mallampati: II  TM Distance: >3 FB Neck ROM: Full    Dental no notable dental hx.    Pulmonary former smoker   Pulmonary exam normal        Cardiovascular hypertension, Pt. on medications Normal cardiovascular exam  TTE 2021: EF 60-65%, valves ok     Neuro/Psych CVA (right carotid dissection 1996)    GI/Hepatic Neg liver ROS,GERD  Medicated,,  Endo/Other  diabetes    Renal/GU negative Renal ROS     Musculoskeletal  (+) Arthritis ,    Abdominal   Peds  Hematology negative hematology ROS (+)   Anesthesia Other Findings   Reproductive/Obstetrics                              Anesthesia Physical Anesthesia Plan  ASA: 2  Anesthesia Plan: Spinal   Post-op Pain Management: Tylenol PO (pre-op)* and Regional block*   Induction:   PONV Risk Score and Plan: 2 and Treatment may vary due to age or medical condition, Ondansetron, Propofol infusion, Dexamethasone  and Midazolam  Airway Management Planned: Natural Airway and Simple Face Mask  Additional Equipment: None  Intra-op Plan:   Post-operative Plan:   Informed Consent: I have reviewed the patients History and Physical, chart, labs and discussed the procedure including the risks, benefits and alternatives for the proposed anesthesia with the patient or authorized representative who has indicated his/her understanding and acceptance.       Plan Discussed with: CRNA  Anesthesia Plan Comments: (See PAT note 01/12/2024)         Anesthesia Quick Evaluation

## 2024-01-13 NOTE — Progress Notes (Signed)
 Anesthesia Chart Review   Case: [id number] Date/Time: 01/20/24 0700   Procedure: ARTHROPLASTY, KNEE, TOTAL (Right: Knee)   Anesthesia type: Spinal   Diagnosis: Primary osteoarthritis of right knee [M17.11]   Pre-op diagnosis: Right knee osteoarthritis   Location: [facility] ROOM 09 / [facility]   Surgeons: Ernie Cough, MD       DISCUSSION:68 y.o. former smoker with h/o HTN, GERD, stroke, DM II, prostate cancer, right knee OA scheduled for above procedure 01/20/2024 with Dr. Cough Ernie.   Pt seen by PCP for preoperative evaluation 01/06/2024. Per OV note, Chronic medical problems stable Hypertension controlled, sugars controlled No concerning symptoms suggestive of CAD or respiratory disease Labs today - cbc, cmp, a1c EKG today from yesterday reviewed-stable Okay to hold aspirin 7 days prior to surgery and restart when able Low risk for low risk surgery -form sent to surgery  CT cardiac scoring 03/2020 showed coronary calcium  score 253 (76th percentile) Lexiscan  Myoview  03/2020 was normal low risk study without evidence of ischemia or infarction  Pt seen by cardiology 01/05/2024 for preoperative evaluation. Per OV note, According to the Revised Cardiac Risk Index (RCRI), his Perioperative Risk of Major Cardiac Event is (%): 0.9. His Functional Capacity in METs is: 5.62 according to the Duke Activity Status Index (DASI). Therefore, based on ACC/AHA guidelines, patient would be at acceptable risk for the planned procedure without further cardiovascular testing. I will route this recommendation to the requesting party via Epic fax function. He may hold aspirin for 5-7 days prior to procedure. Please resume aspirin as soon as possible postprocedure, at the discretion of the surgeon.  VS: BP (!) 152/96   Pulse (!) 58   Temp 36.8 C (Oral)   Resp 16   Ht 5' 7 (1.702 m)   Wt 95.3 kg   SpO2 99%   BMI 32.89 kg/m   PROVIDERS: Geofm Glade PARAS, MD is PCP   Cardiologist:  Darryle ONEIDA Decent, MD   LABS: Labs reviewed: Acceptable for surgery. (all labs ordered are listed, but only abnormal results are displayed)  Labs Reviewed  BASIC METABOLIC PANEL WITH GFR - Abnormal; Notable for the following components:      Result Value   Glucose, Bld 123 (*)    All other components within normal limits  CBC - Abnormal; Notable for the following components:   WBC 10.6 (*)    MCV 104.2 (*)    MCH 34.2 (*)    All other components within normal limits  HEMOGLOBIN A1C - Abnormal; Notable for the following components:   Hgb A1c MFr Bld 6.1 (*)    All other components within normal limits  SURGICAL PCR SCREEN     IMAGES: VAS US  Carotid 12/10/2021 Summary:  Right Carotid: Velocities in the right ICA are consistent with a 1-39%  stenosis.   Left Carotid: There is no evidence of stenosis in the left ICA. The  extracranial               vessels were near-normal with only minimal wall thickening  or               plaque.   Vertebrals:  Bilateral vertebral arteries demonstrate antegrade flow.  Subclavians: Normal flow hemodynamics were seen in bilateral subclavian               arteries.   EKG:   CV: Myocardial Perfusion 04/13/2020 The left ventricular ejection fraction is normal (55-65%). Nuclear stress EF: 55%. There was no ST segment deviation  noted during stress. The study is normal. This is a low risk study. There is no evidence of ischemia or infarction.  Echo 10/13/2019 1. Left ventricular ejection fraction, by estimation, is 60 to 65%. The  left ventricle has normal function. The left ventricle has no regional  wall motion abnormalities. Left ventricular diastolic parameters were  normal.   2. Right ventricular systolic function is normal. The right ventricular  size is normal. There is normal pulmonary artery systolic pressure. The  estimated right ventricular systolic pressure is 17.4 mmHg.   3. The mitral valve is grossly normal. No evidence of mitral valve   regurgitation. No evidence of mitral stenosis.   4. The aortic valve is tricuspid. Aortic valve regurgitation is not  visualized. No aortic stenosis is present.   5. The inferior vena cava is normal in size with greater than 50%  respiratory variability, suggesting right atrial pressure of 3 mmHg.   Conclusion(s)/Recommendation(s): Normal biventricular function without  evidence of hemodynamically significant valvular heart disease.   Past Medical History:  Diagnosis Date   Arthritis    Barrett's esophagus - RESOLVED 2008   RESOLVED ON SUBSEQUENT EXAMS   Carotid artery dissection 2011   L   Diabetes mellitus without complication (HCC)    Diverticulosis    GERD (gastroesophageal reflux disease)    Goiter    Hemorrhoids    Horner's syndrome 2012   Hyperlipidemia    Hypertension    Personal history of colonic adenomas 01/14/2008   Prostate cancer (HCC) 2010   Stroke (HCC) 1996   FROM DISSECTING R CAROID ARTERY     Past Surgical History:  Procedure Laterality Date   BIOPSY THYROID   2007   colloid  admixed with scant follicular epithelium   CAROTID ARTERY ANGIOPLASTY  2011   L   COLONOSCOPY     COLONOSCOPY W/ BIOPSIES AND POLYPECTOMY  multiple   adenomatous polyps, internal hemorrhoids, diverticulosis   CORNEAL TRANSPLANT Left 2015    corneal pannus & perforation,[facility]   DISSECTION OF CAROID ARTERY     WITH CVA   KNEE SURGERY     RIGHT..TO STRIGHTEN PATELLA   ROBOTIC PROSTATECTOMY  2011   Dr Noretta Ferrara   UPPER GASTROINTESTINAL ENDOSCOPY  multiple   w/dilation, Barrett's , esophageal ring, hiatal hernia, gastritis    MEDICATIONS:  aspirin EC 81 MG tablet   esomeprazole  (NEXIUM ) 40 MG capsule   hydrochlorothiazide  (MICROZIDE ) 12.5 MG capsule   prednisoLONE acetate (PRED FORTE) 1 % ophthalmic suspension   ramipril  (ALTACE ) 2.5 MG capsule   rosuvastatin  (CRESTOR ) 40 MG tablet   triamcinolone  cream (KENALOG ) 0.1 %   valACYclovir (VALTREX) 1000 MG tablet   verapamil   (CALAN -SR) 240 MG CR tablet   No current facility-administered medications for this encounter.      Harlene Hoots Ward, PA-C [facility] [phone]

## 2024-01-20 ENCOUNTER — Encounter (HOSPITAL_COMMUNITY)
Admission: RE | Disposition: A | Discharge status: Home / Self Care | Payer: Self-pay | Source: Ambulatory Visit | Attending: Orthopedic Surgery

## 2024-01-20 ENCOUNTER — Ambulatory Visit (HOSPITAL_COMMUNITY): Payer: Self-pay | Admitting: Physician Assistant

## 2024-01-20 ENCOUNTER — Ambulatory Visit (HOSPITAL_COMMUNITY)
Admission: RE | Admit: 2024-01-20 | Discharge: 2024-01-20 | Disposition: A | Discharge status: Home / Self Care | Source: Ambulatory Visit | Attending: Orthopedic Surgery | Admitting: Orthopedic Surgery

## 2024-01-20 ENCOUNTER — Encounter (HOSPITAL_COMMUNITY): Payer: Self-pay | Admitting: Orthopedic Surgery

## 2024-01-20 ENCOUNTER — Ambulatory Visit (HOSPITAL_BASED_OUTPATIENT_CLINIC_OR_DEPARTMENT_OTHER): Payer: Self-pay | Admitting: Anesthesiology

## 2024-01-20 ENCOUNTER — Other Ambulatory Visit: Payer: Self-pay

## 2024-01-20 DIAGNOSIS — M1711 Unilateral primary osteoarthritis, right knee: Secondary | ICD-10-CM

## 2024-01-20 DIAGNOSIS — E1159 Type 2 diabetes mellitus with other circulatory complications: Secondary | ICD-10-CM | POA: Diagnosis not present

## 2024-01-20 DIAGNOSIS — Z87891 Personal history of nicotine dependence: Secondary | ICD-10-CM | POA: Insufficient documentation

## 2024-01-20 DIAGNOSIS — E1140 Type 2 diabetes mellitus with diabetic neuropathy, unspecified: Secondary | ICD-10-CM | POA: Diagnosis not present

## 2024-01-20 DIAGNOSIS — I10 Essential (primary) hypertension: Secondary | ICD-10-CM | POA: Diagnosis not present

## 2024-01-20 DIAGNOSIS — E1169 Type 2 diabetes mellitus with other specified complication: Secondary | ICD-10-CM | POA: Insufficient documentation

## 2024-01-20 DIAGNOSIS — Z96651 Presence of right artificial knee joint: Secondary | ICD-10-CM

## 2024-01-20 DIAGNOSIS — Z79899 Other long term (current) drug therapy: Secondary | ICD-10-CM | POA: Insufficient documentation

## 2024-01-20 DIAGNOSIS — K219 Gastro-esophageal reflux disease without esophagitis: Secondary | ICD-10-CM | POA: Diagnosis not present

## 2024-01-20 DIAGNOSIS — G8918 Other acute postprocedural pain: Secondary | ICD-10-CM | POA: Diagnosis not present

## 2024-01-20 DIAGNOSIS — Z01818 Encounter for other preprocedural examination: Secondary | ICD-10-CM

## 2024-01-20 DIAGNOSIS — E785 Hyperlipidemia, unspecified: Secondary | ICD-10-CM | POA: Diagnosis not present

## 2024-01-20 DIAGNOSIS — Z8673 Personal history of transient ischemic attack (TIA), and cerebral infarction without residual deficits: Secondary | ICD-10-CM | POA: Insufficient documentation

## 2024-01-20 HISTORY — PX: TOTAL KNEE ARTHROPLASTY: SHX125

## 2024-01-20 LAB — GLUCOSE, CAPILLARY: Glucose-Capillary: 155 mg/dL — ABNORMAL HIGH (ref 70–99)

## 2024-01-20 SURGERY — ARTHROPLASTY, KNEE, TOTAL
Anesthesia: Spinal | Site: Knee | Laterality: Right

## 2024-01-20 MED ORDER — FENTANYL CITRATE (PF) 50 MCG/ML IJ SOSY
25.0000 ug | PREFILLED_SYRINGE | INTRAMUSCULAR | Status: DC | PRN
  Administered 2024-01-20 (×2): 50 ug via INTRAVENOUS

## 2024-01-20 MED ORDER — CEFAZOLIN SODIUM-DEXTROSE 2-4 GM/100ML-% IV SOLN
2.0000 g | Freq: Four times a day (QID) | INTRAVENOUS | Status: DC
  Administered 2024-01-20: 2 g via INTRAVENOUS

## 2024-01-20 MED ORDER — METOCLOPRAMIDE HCL 5 MG/ML IJ SOLN: 5.0000 mg | Freq: Three times a day (TID) | INTRAMUSCULAR | Status: DC | PRN

## 2024-01-20 MED ORDER — PROPOFOL 1000 MG/100ML IV EMUL
INTRAVENOUS | Status: AC
  Filled 2024-01-20: qty 100

## 2024-01-20 MED ORDER — ACETAMINOPHEN 500 MG PO TABS
1000.0000 mg | ORAL_TABLET | Freq: Once | ORAL | Status: AC
  Administered 2024-01-20: 1000 mg via ORAL
  Filled 2024-01-20: qty 2

## 2024-01-20 MED ORDER — OXYCODONE HCL 5 MG PO TABS: 10.0000 mg | ORAL_TABLET | ORAL | Status: DC | PRN

## 2024-01-20 MED ORDER — TRANEXAMIC ACID-NACL 1000-0.7 MG/100ML-% IV SOLN
INTRAVENOUS | Status: AC
  Filled 2024-01-20: qty 100

## 2024-01-20 MED ORDER — SODIUM CHLORIDE (PF) 0.9 % IJ SOLN
INTRAMUSCULAR | Status: DC | PRN
  Administered 2024-01-20: 61 mL

## 2024-01-20 MED ORDER — OXYCODONE HCL 5 MG PO TABS
5.0000 mg | ORAL_TABLET | ORAL | Status: DC | PRN
  Administered 2024-01-20: 5 mg via ORAL

## 2024-01-20 MED ORDER — BUPIVACAINE-EPINEPHRINE (PF) 0.5% -1:200000 IJ SOLN
INTRAMUSCULAR | Status: DC | PRN
  Administered 2024-01-20: 15 mL via PERINEURAL

## 2024-01-20 MED ORDER — CEFAZOLIN SODIUM-DEXTROSE 2-4 GM/100ML-% IV SOLN
INTRAVENOUS | Status: AC
  Filled 2024-01-20: qty 100

## 2024-01-20 MED ORDER — FENTANYL CITRATE (PF) 50 MCG/ML IJ SOSY
PREFILLED_SYRINGE | INTRAMUSCULAR | Status: AC
  Filled 2024-01-20: qty 1

## 2024-01-20 MED ORDER — CEFAZOLIN SODIUM-DEXTROSE 2-4 GM/100ML-% IV SOLN
2.0000 g | INTRAVENOUS | Status: AC
  Administered 2024-01-20: 2 g via INTRAVENOUS
  Filled 2024-01-20: qty 100

## 2024-01-20 MED ORDER — TRANEXAMIC ACID-NACL 1000-0.7 MG/100ML-% IV SOLN
1000.0000 mg | INTRAVENOUS | Status: AC
  Administered 2024-01-20: 1000 mg via INTRAVENOUS
  Filled 2024-01-20: qty 100

## 2024-01-20 MED ORDER — BUPIVACAINE-EPINEPHRINE (PF) 0.25% -1:200000 IJ SOLN
INTRAMUSCULAR | Status: AC
Start: 2024-01-20 — End: 2024-01-20
  Filled 2024-01-20: qty 30

## 2024-01-20 MED ORDER — HYDROMORPHONE HCL 1 MG/ML IJ SOLN: 0.5000 mg | INTRAMUSCULAR | Status: DC | PRN

## 2024-01-20 MED ORDER — LACTATED RINGERS IV BOLUS
500.0000 mL | Freq: Once | INTRAVENOUS | Status: AC
  Administered 2024-01-20: 500 mL via INTRAVENOUS

## 2024-01-20 MED ORDER — OXYCODONE HCL 5 MG PO TABS
ORAL_TABLET | ORAL | Status: AC
  Filled 2024-01-20: qty 1

## 2024-01-20 MED ORDER — ORAL CARE MOUTH RINSE: 15.0000 mL | Freq: Once | OROMUCOSAL | Status: AC

## 2024-01-20 MED ORDER — DEXAMETHASONE SOD PHOSPHATE PF 10 MG/ML IJ SOLN
8.0000 mg | Freq: Once | INTRAMUSCULAR | Status: AC
  Administered 2024-01-20: 8 mg via INTRAVENOUS

## 2024-01-20 MED ORDER — SODIUM CHLORIDE 0.9 % IR SOLN
Status: DC | PRN
  Administered 2024-01-20: 1000 mL

## 2024-01-20 MED ORDER — CLONIDINE HCL (ANALGESIA) 100 MCG/ML EP SOLN
EPIDURAL | Status: DC | PRN
  Administered 2024-01-20: 100 ug

## 2024-01-20 MED ORDER — METHOCARBAMOL 1000 MG/10ML IJ SOLN: 500.0000 mg | Freq: Four times a day (QID) | INTRAMUSCULAR | Status: DC | PRN

## 2024-01-20 MED ORDER — MIDAZOLAM HCL 2 MG/2ML IJ SOLN
INTRAMUSCULAR | Status: AC
  Filled 2024-01-20: qty 2

## 2024-01-20 MED ORDER — PROPOFOL 500 MG/50ML IV EMUL
INTRAVENOUS | Status: DC | PRN
  Administered 2024-01-20 (×2): 20 mg via INTRAVENOUS
  Administered 2024-01-20: 40 ug/kg/min via INTRAVENOUS

## 2024-01-20 MED ORDER — 0.9 % SODIUM CHLORIDE (POUR BTL) OPTIME
TOPICAL | Status: DC | PRN
  Administered 2024-01-20: 1000 mL

## 2024-01-20 MED ORDER — METHOCARBAMOL 500 MG PO TABS
ORAL_TABLET | ORAL | Status: AC
  Filled 2024-01-20: qty 1

## 2024-01-20 MED ORDER — METHOCARBAMOL 500 MG PO TABS
500.0000 mg | ORAL_TABLET | Freq: Four times a day (QID) | ORAL | Status: DC | PRN
  Administered 2024-01-20: 500 mg via ORAL

## 2024-01-20 MED ORDER — FENTANYL CITRATE (PF) 100 MCG/2ML IJ SOLN
INTRAMUSCULAR | Status: DC | PRN
  Administered 2024-01-20: 12.5 ug via INTRAVENOUS
  Administered 2024-01-20: 50 ug via INTRAVENOUS
  Administered 2024-01-20: 12.5 ug via INTRAVENOUS
  Administered 2024-01-20: 25 ug via INTRAVENOUS

## 2024-01-20 MED ORDER — OXYCODONE HCL 5 MG/5ML PO SOLN: 5.0000 mg | Freq: Once | ORAL | Status: AC | PRN

## 2024-01-20 MED ORDER — OXYCODONE HCL 5 MG PO TABS
5.0000 mg | ORAL_TABLET | Freq: Once | ORAL | Status: AC | PRN
  Administered 2024-01-20: 5 mg via ORAL

## 2024-01-20 MED ORDER — PROPOFOL 500 MG/50ML IV EMUL
INTRAVENOUS | Status: AC
  Filled 2024-01-20: qty 50

## 2024-01-20 MED ORDER — ONDANSETRON HCL 4 MG/2ML IJ SOLN
INTRAMUSCULAR | Status: AC
  Filled 2024-01-20: qty 2

## 2024-01-20 MED ORDER — FENTANYL CITRATE (PF) 100 MCG/2ML IJ SOLN
INTRAMUSCULAR | Status: AC
  Filled 2024-01-20: qty 2

## 2024-01-20 MED ORDER — MIDAZOLAM HCL 5 MG/5ML IJ SOLN
INTRAMUSCULAR | Status: DC | PRN
  Administered 2024-01-20: 2 mg via INTRAVENOUS

## 2024-01-20 MED ORDER — ONDANSETRON HCL 4 MG PO TABS: 4.0000 mg | ORAL_TABLET | Freq: Four times a day (QID) | ORAL | Status: DC | PRN

## 2024-01-20 MED ORDER — ACETAMINOPHEN 500 MG PO TABS
1000.0000 mg | ORAL_TABLET | Freq: Four times a day (QID) | ORAL | Status: DC
  Administered 2024-01-20: 1000 mg via ORAL

## 2024-01-20 MED ORDER — MEPIVACAINE HCL (PF) 2 % IJ SOLN
INTRAMUSCULAR | Status: AC
  Filled 2024-01-20: qty 20

## 2024-01-20 MED ORDER — ACETAMINOPHEN 500 MG PO TABS
ORAL_TABLET | ORAL | Status: AC
  Filled 2024-01-20: qty 2

## 2024-01-20 MED ORDER — ONDANSETRON HCL 4 MG/2ML IJ SOLN: 4.0000 mg | Freq: Four times a day (QID) | INTRAMUSCULAR | Status: DC | PRN

## 2024-01-20 MED ORDER — PHENYLEPHRINE HCL-NACL 20-0.9 MG/250ML-% IV SOLN
INTRAVENOUS | Status: DC | PRN
  Administered 2024-01-20: 25 ug/min via INTRAVENOUS

## 2024-01-20 MED ORDER — DROPERIDOL 2.5 MG/ML IJ SOLN: 0.6250 mg | Freq: Once | INTRAMUSCULAR | Status: DC | PRN

## 2024-01-20 MED ORDER — TRANEXAMIC ACID-NACL 1000-0.7 MG/100ML-% IV SOLN
1000.0000 mg | Freq: Once | INTRAVENOUS | Status: AC
  Administered 2024-01-20: 1000 mg via INTRAVENOUS

## 2024-01-20 MED ORDER — KETOROLAC TROMETHAMINE 30 MG/ML IJ SOLN
INTRAMUSCULAR | Status: AC
  Filled 2024-01-20: qty 1

## 2024-01-20 MED ORDER — CHLORHEXIDINE GLUCONATE 0.12 % MT SOLN
15.0000 mL | Freq: Once | OROMUCOSAL | Status: AC
  Administered 2024-01-20: 15 mL via OROMUCOSAL

## 2024-01-20 MED ORDER — ONDANSETRON HCL 4 MG/2ML IJ SOLN
INTRAMUSCULAR | Status: DC | PRN
Start: 2024-01-20 — End: 2024-01-20
  Administered 2024-01-20: 4 mg via INTRAVENOUS

## 2024-01-20 MED ORDER — LACTATED RINGERS IV SOLN: INTRAVENOUS | Status: DC

## 2024-01-20 MED ORDER — POVIDONE-IODINE 10 % EX SWAB: 2.0000 | Freq: Once | CUTANEOUS | Status: DC

## 2024-01-20 MED ORDER — METOCLOPRAMIDE HCL 5 MG PO TABS: 5.0000 mg | ORAL_TABLET | Freq: Three times a day (TID) | ORAL | Status: DC | PRN

## 2024-01-20 MED ORDER — STERILE WATER FOR IRRIGATION IR SOLN
Status: DC | PRN
  Administered 2024-01-20: 2000 mL

## 2024-01-20 MED ORDER — MEPIVACAINE HCL (PF) 2 % IJ SOLN
INTRAMUSCULAR | Status: DC | PRN
  Administered 2024-01-20: 60 mg via INTRATHECAL

## 2024-01-20 MED ORDER — SODIUM CHLORIDE (PF) 0.9 % IJ SOLN
INTRAMUSCULAR | Status: AC
  Filled 2024-01-20: qty 30

## 2024-01-20 SURGICAL SUPPLY — 46 items
ATTUNE MED ANAT PAT 38 KNEE (Knees) IMPLANT
BAG COUNTER SPONGE SURGICOUNT (BAG) IMPLANT
BAG ZIPLOCK 12X15 (MISCELLANEOUS) ×2 IMPLANT
BASEPLATE TIB CMT FB PCKT SZ5 (Knees) IMPLANT
BLADE SAW SGTL 11.0X1.19X90.0M (BLADE) IMPLANT
BLADE SAW SGTL 13.0X1.19X90.0M (BLADE) ×2 IMPLANT
BNDG ELASTIC 6INX 5YD STR LF (GAUZE/BANDAGES/DRESSINGS) ×2 IMPLANT
BOWL SMART MIX CTS (DISPOSABLE) ×2 IMPLANT
CEMENT HV SMART SET (Cement) ×4 IMPLANT
COMPONENT FEM CMT ATTN NRW 4RT (Joint) IMPLANT
COVER SURGICAL LIGHT HANDLE (MISCELLANEOUS) ×2 IMPLANT
CUFF TRNQT CYL 34X4.125X (TOURNIQUET CUFF) ×2 IMPLANT
DERMABOND ADVANCED .7 DNX12 (GAUZE/BANDAGES/DRESSINGS) ×2 IMPLANT
DRAPE U-SHAPE 47X51 STRL (DRAPES) ×2 IMPLANT
DRESSING AQUACEL AG SP 3.5X10 (GAUZE/BANDAGES/DRESSINGS) ×2 IMPLANT
DRSG AQUACEL AG ADV 3.5X10 (GAUZE/BANDAGES/DRESSINGS) IMPLANT
DURAPREP 26ML APPLICATOR (WOUND CARE) ×4 IMPLANT
ELECT REM PT RETURN 15FT ADLT (MISCELLANEOUS) ×2 IMPLANT
GLOVE BIO SURGEON STRL SZ 6 (GLOVE) ×2 IMPLANT
GLOVE BIOGEL PI IND STRL 6.5 (GLOVE) ×2 IMPLANT
GLOVE BIOGEL PI IND STRL 7.5 (GLOVE) ×2 IMPLANT
GLOVE ORTHO TXT STRL SZ7.5 (GLOVE) ×4 IMPLANT
GOWN STRL REUS W/ TWL LRG LVL3 (GOWN DISPOSABLE) ×4 IMPLANT
HOLDER FOLEY CATH W/STRAP (MISCELLANEOUS) IMPLANT
INSERT MED ATTUNE 4X6 RT (Insert) IMPLANT
KIT TURNOVER KIT A (KITS) ×2 IMPLANT
MANIFOLD NEPTUNE II (INSTRUMENTS) ×2 IMPLANT
NDL SAFETY ECLIPSE 18X1.5 (NEEDLE) IMPLANT
NS IRRIG 1000ML POUR BTL (IV SOLUTION) ×2 IMPLANT
PACK TOTAL KNEE CUSTOM (KITS) ×2 IMPLANT
PENCIL SMOKE EVACUATOR (MISCELLANEOUS) ×2 IMPLANT
PIN FIX SIGMA LCS THRD HI (PIN) IMPLANT
PROTECTOR NERVE ULNAR (MISCELLANEOUS) ×2 IMPLANT
SET HNDPC FAN SPRY TIP SCT (DISPOSABLE) ×2 IMPLANT
SET PAD KNEE POSITIONER (MISCELLANEOUS) ×2 IMPLANT
SPIKE FLUID TRANSFER (MISCELLANEOUS) ×4 IMPLANT
SUT MNCRL AB 4-0 PS2 18 (SUTURE) ×2 IMPLANT
SUT STRATAFIX PDS+ 0 24IN (SUTURE) ×2 IMPLANT
SUT VIC AB 1 CT1 36 (SUTURE) ×2 IMPLANT
SUT VIC AB 2-0 CT1 TAPERPNT 27 (SUTURE) ×4 IMPLANT
SYR 3ML LL SCALE MARK (SYRINGE) ×2 IMPLANT
TOWEL GREEN STERILE FF (TOWEL DISPOSABLE) ×2 IMPLANT
TRAY FOLEY MTR SLVR 16FR STAT (SET/KITS/TRAYS/PACK) ×2 IMPLANT
TUBE SUCTION HIGH CAP CLEAR NV (SUCTIONS) ×2 IMPLANT
WATER STERILE IRR 1000ML POUR (IV SOLUTION) ×4 IMPLANT
WRAP KNEE MAXI GEL POST OP (GAUZE/BANDAGES/DRESSINGS) ×2 IMPLANT

## 2024-01-20 NOTE — Transfer of Care (Signed)
 Immediate Anesthesia Transfer of Care Note  Patient: Eric Dillon  Procedure(s) Performed: ARTHROPLASTY, KNEE, TOTAL (Right: Knee)  Patient Location: PACU  Anesthesia Type:MAC and Spinal  Level of Consciousness: awake, alert , oriented, and patient cooperative  Airway & Oxygen Therapy: Patient Spontanous Breathing and Patient connected to face mask oxygen  Post-op Assessment: Report given to RN and Post -op Vital signs reviewed and stable  Post vital signs: Reviewed and stable  Last Vitals:  Vitals Value Taken Time  BP 110/66 01/20/24 08:52  Temp    Pulse 61 01/20/24 08:55  Resp 18 01/20/24 08:55  SpO2 100 % 01/20/24 08:55  Vitals shown include unfiled device data.  Last Pain:  Vitals:   01/20/24 0543  TempSrc:   PainSc: 0-No pain         Complications: No notable events documented.

## 2024-01-20 NOTE — Anesthesia Postprocedure Evaluation (Signed)
 Anesthesia Post Note  Patient: Eric Dillon  Procedure(s) Performed: ARTHROPLASTY, KNEE, TOTAL (Right: Knee)     Patient location during evaluation: PACU Anesthesia Type: Spinal Level of consciousness: awake and alert Pain management: pain level controlled Vital Signs Assessment: post-procedure vital signs reviewed and stable Respiratory status: spontaneous breathing, nonlabored ventilation and respiratory function stable Cardiovascular status: blood pressure returned to baseline Postop Assessment: no apparent nausea or vomiting, spinal receding, no headache and no backache Anesthetic complications: no   No notable events documented.  Last Vitals:  Vitals:   01/20/24 0945 01/20/24 1000  BP: 124/72 121/81  Pulse: (!) 53 (!) 55  Resp: 13 13  Temp:    SpO2: 94% 95%    Last Pain:  Vitals:   01/20/24 1012  TempSrc:   PainSc: 6                  Vertell Row

## 2024-01-20 NOTE — Care Plan (Signed)
 Ortho Bundle Case Management Note  Patient Details  Name: Eric Dillon MRN: [id number] Date of Birth: 25-Dec-1955  RT TKA on 01/20/24  DCP: Home with wife  DME: No needs; has RW and cane  PT: [facility]                   DME Arranged:  N/A DME Agency:  NA  HH Arranged:    HH Agency:     Additional Comments: Please contact me with any questions of if this plan should need to change.  Burnard Dross, Case Manager [facility]  [phone]  Ext. [phone]   01/20/2024, 8:19 AM

## 2024-01-20 NOTE — H&P (Signed)
 TOTAL KNEE ADMISSION H&P  Patient is being admitted for right total knee arthroplasty.  Therapy Plans: outpatient therapy at [facility] Disposition: Home with wife Planned DVT Prophylaxis: aspirin 81mg  BID DME needed: none PCP: Dr. Geofm - clearance & labs received Cardio: [facility] - (hx of carotid stenosis / cartoid artery dissection / stroke) TXA: IV Allergies: amox - hives, erythromycin - hives, sulfa - ?, codeine - hives Anesthesia Concerns: none BMI: 33.5 Last HgbA1c: 6.7%   Other: - SDD - No hx of VTE - Hx of prostate cancer - s/p resection - oxycodone, robaxin, tylenol, celebrex   Subjective:  Chief Complaint:right knee pain.  HPI: Eric Dillon, 68 y.o. male, has a history of pain and functional disability in the right knee due to arthritis and has failed non-surgical conservative treatments for greater than 12 weeks to includeNSAID's and/or analgesics, corticosteriod injections, and activity modification.  Onset of symptoms was gradual, starting 2 years ago with gradually worsening course since that time. The patient noted no past surgery on the right knee(s).  Patient currently rates pain in the right knee(s) at 8 out of 10 with activity. Patient has worsening of pain with activity and weight bearing, pain that interferes with activities of daily living, and pain with passive range of motion.  Patient has evidence of joint space narrowing by imaging studies.  There is no active infection.  Patient Active Problem List   Diagnosis Date Noted   Preop examination 01/06/2024   Bilateral primary osteoarthritis of knee 09/08/2023   Neuropathy of both feet 09/08/2023   Mid back pain on right side 05/18/2022   Obesity (BMI 30.0-34.9) 08/29/2021   Fibromuscular dysplasia 09/14/2019   Umbilical hernia without obstruction or gangrene 09/14/2019   DOE (dyspnea on exertion) 09/14/2019   Subclinical hyperthyroidism 03/10/2018   History of skin cancer 09/09/2017   Herpes zoster  without complication 01/28/2017   Type 2 diabetes mellitus with hyperlipidemia (HCC) 06/08/2015   H/O cornea transplant, left 05/30/2015   Nuclear sclerosis of both eyes 12/14/2014   Psoriasis 12/08/2014   Interstitial keratitis of left eye 06/18/2013   Corneal pannus 04/18/2013   Dissection of carotid artery (HCC) 01/19/2010   PROSTATE CANCER, HX OF 12/28/2008   History of colonic polyps 01/14/2008   Hyperlipidemia associated with type 2 diabetes mellitus (HCC) 03/03/2007   Hypertension associated with type 2 diabetes mellitus (HCC) 03/03/2007   GERD with stricture 03/03/2007   History of CVA (cerebrovascular accident) w/ resiual tinnitus 03/03/2007   Past Medical History:  Diagnosis Date   Arthritis    Barrett's esophagus - RESOLVED 2008   RESOLVED ON SUBSEQUENT EXAMS   Carotid artery dissection 2011   L   Diabetes mellitus without complication (HCC)    Diverticulosis    GERD (gastroesophageal reflux disease)    Goiter    Hemorrhoids    Horner's syndrome 2012   Hyperlipidemia    Hypertension    Personal history of colonic adenomas 01/14/2008   Prostate cancer (HCC) 2010   Stroke (HCC) 1996   FROM DISSECTING R CAROID ARTERY     Past Surgical History:  Procedure Laterality Date   BIOPSY THYROID   2007   colloid  admixed with scant follicular epithelium   CAROTID ARTERY ANGIOPLASTY  2011   L   COLONOSCOPY     COLONOSCOPY W/ BIOPSIES AND POLYPECTOMY  multiple   adenomatous polyps, internal hemorrhoids, diverticulosis   CORNEAL TRANSPLANT Left 2015    corneal pannus & perforation,[facility]   DISSECTION OF  CAROID ARTERY     WITH CVA   KNEE SURGERY     RIGHT..TO STRIGHTEN PATELLA   ROBOTIC PROSTATECTOMY  2011   Dr Noretta Ferrara   UPPER GASTROINTESTINAL ENDOSCOPY  multiple   w/dilation, Barrett's , esophageal ring, hiatal hernia, gastritis    Current Facility-Administered Medications  Medication Dose Route Frequency Provider Last Rate Last Admin   ceFAZolin (ANCEF) IVPB  2g/100 mL premix  2 g Intravenous On Call to OR Patti Rosina SAUNDERS, PA-C       dexamethasone  (DECADRON ) injection 8 mg  8 mg Intravenous Once Milena Liggett R, PA-C       lactated ringers infusion   Intravenous Continuous Epifanio Fallow, MD 10 mL/hr at 01/20/24 0604 New Bag at 01/20/24 0604   povidone-iodine 10 % swab 2 Application  2 Application Topical Once Patti Rosina SAUNDERS, PA-C       tranexamic acid (CYKLOKAPRON) IVPB 1,000 mg  1,000 mg Intravenous To OR Patti Rosina SAUNDERS, PA-C       Allergies  Allergen Reactions   Amoxicillin     REACTION: hives   Codeine     REACTION: rash   Sulfonamide Derivatives     REACTION: angioedema   Erythromycin     REACTION: ? reaction   Vitamin B50 Complex [Balanced B-100]     Itching, burning in hands    Social History   Tobacco Use   Smoking status: Former    Types: E-cigarettes    Quit date: 01/23/2010    Years since quitting: 14.0   Smokeless tobacco: Never   Tobacco comments:    smoked 1974-2011, up to 1 ppd  Substance Use Topics   Alcohol use: Yes    Alcohol/week: 21.0 - 28.0 standard drinks of alcohol    Types: 21 - 28 Cans of beer per week    Comment: 2 times per week    Family History  Problem Relation Age of Onset   Diabetes Mother    Stroke Father 53       CVA,BLOOD CLOT TO BRAIN   CAD Father        BYPASS   Stroke Paternal Grandmother 29       MULTIPLE CVA'S; onset late70s   Cancer Neg Hx    Colon cancer Neg Hx    Esophageal cancer Neg Hx    Rectal cancer Neg Hx    Stomach cancer Neg Hx      Review of Systems  Constitutional:  Negative for chills and fever.  Respiratory:  Negative for cough and shortness of breath.   Cardiovascular:  Negative for chest pain.  Gastrointestinal:  Negative for nausea and vomiting.  Musculoskeletal:  Positive for arthralgias.     Objective:  Physical Exam Right Knee: No erythema, warmth, or effusion Slight varus abnormality Tender over the medial joint line Generally  full ROM of the right knee  Vital signs in last 24 hours: Temp:  [97.9 F (36.6 C)] 97.9 F (36.6 C) (10/28 0521) Pulse Rate:  [67] 67 (10/28 0521) Resp:  [17] 17 (10/28 0521) BP: (156)/(73) 156/73 (10/28 0521) SpO2:  [97 %] 97 % (10/28 0521) Weight:  [95.3 kg] 95.3 kg (10/28 0543)  Labs:   Estimated body mass index is 32.89 kg/m as calculated from the following:   Height as of this encounter: 5' 7 (1.702 m).   Weight as of this encounter: 95.3 kg.   Imaging Review Plain radiographs demonstrate severe degenerative joint disease of the right knee(s). The overall  alignment isneutral. The bone quality appears to be adequate for age and reported activity level.      Assessment/Plan:  End stage arthritis, right knee   The patient history, physical examination, clinical judgment of the provider and imaging studies are consistent with end stage degenerative joint disease of the right knee(s) and total knee arthroplasty is deemed medically necessary. The treatment options including medical management, injection therapy arthroscopy and arthroplasty were discussed at length. The risks and benefits of total knee arthroplasty were presented and reviewed. The risks due to aseptic loosening, infection, stiffness, patella tracking problems, thromboembolic complications and other imponderables were discussed. The patient acknowledged the explanation, agreed to proceed with the plan and consent was signed. Patient is being admitted for inpatient treatment for surgery, pain control, PT, OT, prophylactic antibiotics, VTE prophylaxis, progressive ambulation and ADL's and discharge planning. The patient is planning to be discharged home.     Patient's anticipated LOS is less than 2 midnights, meeting these requirements: - Younger than 22 - Lives within 1 hour of care - Has a competent adult at home to recover with post-op recover - NO history of  - Chronic pain requiring opiods  - Diabetes  -  Coronary Artery Disease  - Heart failure  - Heart attack  - Stroke  - DVT/VTE  - Cardiac arrhythmia  - Respiratory Failure/COPD  - Renal failure  - Anemia  - Advanced Liver disease  Rosina Calin, PA-C Orthopedic Surgery [facility] [place] [phone]

## 2024-01-20 NOTE — Anesthesia Procedure Notes (Signed)
 Procedure Name: MAC Date/Time: 01/20/2024 7:16 AM  Performed by: Memory Armida LABOR, CRNAPre-anesthesia Checklist: Patient identified, Emergency Drugs available, Suction available, Patient being monitored and Timeout performed Patient Re-evaluated:Patient Re-evaluated prior to induction Oxygen Delivery Method: Simple face mask Preoxygenation: Pre-oxygenation with 100% oxygen Placement Confirmation: positive ETCO2 Dental Injury: Teeth and Oropharynx as per pre-operative assessment

## 2024-01-20 NOTE — Op Note (Signed)
 NAME:  Eric Dillon                      MEDICAL RECORD NO.:  [id number]                             FACILITY:  [facility]      PHYSICIAN:  Donnice BIRCH. Ernie, M.D.  DATE OF BIRTH:  1956-03-01      DATE OF PROCEDURE:  01/20/2024                                     OPERATIVE REPORT         PREOPERATIVE DIAGNOSIS:  Right knee osteoarthritis.      POSTOPERATIVE DIAGNOSIS:  Right knee osteoarthritis.      FINDINGS:  The patient was noted to have complete loss of cartilage and   bone-on-bone arthritis with associated osteophytes in the medial and patellofemoral compartments of   the knee with a significant synovitis and associated effusion.  The patient had failed months of conservative treatment including medications, injection therapy, activity modification.     PROCEDURE:  Right total knee replacement.      COMPONENTS USED:  DePuy Attune FB CR MS knee   system, a size 4N femur, 5 tibia, size 6 mm CR MS AOX insert, and 38 anatomic patellar   button.      SURGEON:  Donnice BIRCH. Ernie, M.D.      ASSISTANT:  Rosina Calin, PA-C.      ANESTHESIA:  Regional and Spinal.      SPECIMENS:  None.      COMPLICATION:  None.      DRAINS:  None.  EBL: <200 cc      TOURNIQUET TIME:  26 min at 225 mmHg     The patient was stable to the recovery room.      INDICATION FOR PROCEDURE:  Eric Dillon is a 68 y.o. male patient of   mine.  The patient had been seen, evaluated, and treated for months conservatively in the   office with medication, activity modification, and injections.  The patient had   radiographic changes of bone-on-bone arthritis with endplate sclerosis and osteophytes noted.  Based on the radiographic changes and failed conservative measures, the patient   decided to proceed with definitive treatment, total knee replacement.  Risks of infection, DVT, component failure, need for revision surgery, neurovascular injury were reviewed in the office setting.  The postop course was  reviewed stressing the efforts to maximize post-operative satisfaction and function.  Consent was obtained for benefit of pain   relief.      PROCEDURE IN DETAIL:  The patient was brought to the operative theater.   Once adequate anesthesia, preoperative antibiotics, 2 gm of Ancef,1 gm of Tranexamic Acid, and 10 mg of Decadron  administered, the patient was positioned supine with a right thigh tourniquet placed.  The  right lower extremity was prepped and draped in sterile fashion.  A time-   out was performed identifying the patient, planned procedure, and the appropriate extremity.      The right lower extremity was placed in the [facility] leg holder.  The leg was   exsanguinated, tourniquet elevated to 225 mmHg.  A midline incision was   made followed by median parapatellar arthrotomy.  Following initial   exposure, attention was  first directed to the patella.  Precut   measurement was noted to be 24 mm.  I resected down to 14 mm and used a   38 anatomic patellar button to restore patellar height as well as cover the cut surface.      The lug holes were drilled and a metal shim was placed to protect the   patella from retractors and saw blade during the procedure.      At this point, attention was now directed to the femur.  The femoral   canal was opened with a drill, irrigated to try to prevent fat emboli.  An   intramedullary rod was passed at 5 degrees valgus, 8 mm of bone was   resected off the distal femur.  Following this resection, the tibia was   subluxated anteriorly.  Using the extramedullary guide, 2 mm of bone was resected off   the proximal medial tibia.  We confirmed the gap would be   stable medially and laterally with a size 5 spacer block as well as confirmed that the tibial cut was perpendicular in the coronal plane, checking with an alignment rod.      Once this was done, I sized the femur to be a size 4 in the anterior-   posterior dimension, chose a narrow component  based on medial and   lateral dimension.  The size 4 rotation block was then pinned in   position anterior referenced using the C-clamp to set rotation.  The   anterior, posterior, and  chamfer cuts were made without difficulty nor   notching making certain that I was along the anterior cortex to help   with flexion gap stability.      The final box cut was made off the lateral aspect of distal femur.      At this point, the tibia was sized to be a size 5.  The size 5 tray was   then pinned in position through the medial third of the tubercle,   drilled, and keel punched.  Trial reduction was now carried with a 4 femur,  5 tibia, a size 6 mm CR MS insert, and the 38 anatomic patella botton.  The knee was brought to full extension with good flexion stability with the patella   tracking through the trochlea without application of pressure.  Given   all these findings the trial components removed.  Final components were   opened and cement was mixed.  The knee was irrigated with normal saline solution and pulse lavage.  The synovial lining was   then injected with 30 cc of 0.25% Marcaine with epinephrine, 1 cc of Toradol and 30 cc of NS for a total of 61 cc.     Final implants were then cemented onto cleaned and dried cut surfaces of bone with the knee brought to extension with a size 6 mm CR MS trial insert.      Once the cement had fully cured, excess cement was removed   throughout the knee.  I confirmed that I was satisfied with the range of   motion and stability, and the final size 6 mm CR MS AOX insert was chosen.  It was   placed into the knee.      The tourniquet had been let down at 26 minutes.  No significant   hemostasis was required.  The extensor mechanism was then reapproximated using #1 Vicryl and #1 Stratafix sutures with the knee   in flexion.  The   remaining wound was closed with 2-0 Vicryl and running 4-0 Monocryl.   The knee was cleaned, dried, dressed sterilely using  Dermabond and   Aquacel dressing.  The patient was then   brought to recovery room in stable condition, tolerating the procedure   well.   Please note that Physician Assistant, Rosina Calin, PA-C was present for the entirety of the case, and was utilized for pre-operative positioning, peri-operative retractor management, general facilitation of the procedure and for primary wound closure at the end of the case.              Donnice CORDOBA Ernie, M.D.    01/20/2024 7:21 AM

## 2024-01-20 NOTE — Interval H&P Note (Signed)
 History and Physical Interval Note:  01/20/2024 7:07 AM  Eric Dillon  has presented today for surgery, with the diagnosis of Right knee osteoarthritis.  The various methods of treatment have been discussed with the patient and family. After consideration of risks, benefits and other options for treatment, the patient has consented to  Procedure(s): ARTHROPLASTY, KNEE, TOTAL (Right) as a surgical intervention.  The patient's history has been reviewed, patient examined, no change in status, stable for surgery.  I have reviewed the patient's chart and labs.  Questions were answered to the patient's satisfaction.     [name]

## 2024-01-20 NOTE — Discharge Instructions (Signed)

## 2024-01-20 NOTE — Anesthesia Procedure Notes (Signed)
 Anesthesia Regional Block: Adductor canal block   Pre-Anesthetic Checklist: , timeout performed,  Correct Patient, Correct Site, Correct Laterality,  Correct Procedure, Correct Position, site marked,  Risks and benefits discussed,  Pre-op evaluation,  At surgeon's request and post-op pain management  Laterality: Right  Prep: Maximum Sterile Barrier Precautions used, chloraprep       Needles:  Injection technique: Single-shot  Needle Type: Echogenic Stimulator Needle     Needle Length: 9cm  Needle Gauge: 22     Additional Needles:   Procedures:,,,, ultrasound used (permanent image in chart),,    Narrative:  Start time: 01/20/2024 6:51 AM End time: 01/20/2024 6:54 AM Injection made incrementally with aspirations every 5 mL.  Performed by: Personally  Anesthesiologist: Paul Lamarr BRAVO, MD  Additional Notes: Risks, benefits, and alternative discussed. Patient gave consent for procedure. Patient prepped and draped in sterile fashion. Sedation administered, patient remains easily responsive to voice. Relevant anatomy identified with ultrasound guidance. Local anesthetic given in 5cc increments with no signs or symptoms of intravascular injection. No pain or paraesthesias with injection. Patient monitored throughout procedure with no signs of LAST or immediate complications. Tolerated well. Ultrasound image placed in chart.  LANEY Paul, MD

## 2024-01-20 NOTE — Evaluation (Signed)
 Physical Therapy Evaluation Patient Details Name: Eric Dillon MRN: [id number] DOB: [birth year] Today's Date: 01/20/2024  History of Present Illness  68 yo male S/P RTKA 01/20/24.  Clinical Impression   The patient  ambulated with Rw and practiced steps. Reviewed HEP and use of cane and rail on steps to second level. Patient has met PT goals to DC.        If plan is discharge home, recommend the following: A little help with walking and/or transfers;A little help with bathing/dressing/bathroom;Help with stairs or ramp for entrance;Assist for transportation   Can travel by private vehicle        Equipment Recommendations None recommended by PT  Recommendations for Other Services       Functional Status Assessment Patient has had a recent decline in their functional status and demonstrates the ability to make significant improvements in function in a reasonable and predictable amount of time.     Precautions / Restrictions Precautions Precautions: Fall;Knee Restrictions Weight Bearing Restrictions Per Provider Order: No      Mobility  Bed Mobility Overal bed mobility: Modified Independent                  Transfers Overall transfer level: Needs assistance Equipment used: Rolling walker (2 wheels) Transfers: Sit to/from Stand Sit to Stand: Supervision           General transfer comment: cues for hand placement    Ambulation/Gait Ambulation/Gait assistance: Contact guard assist Gait Distance (Feet): 80 Feet Assistive device: Rolling walker (2 wheels) Gait Pattern/deviations: Step-to pattern, Step-through pattern       General Gait Details: cues for position inside RW  Stairs Stairs: Yes Stairs assistance: Contact guard assist Stair Management: Two rails, Forwards, Step to pattern Number of Stairs: 2 General stair comments: reviewed to use cane and rail on upstairs, patient reports plans to stay down a few days  Wheelchair Mobility      Tilt Bed    Modified Rankin (Stroke Patients Only)       Balance Overall balance assessment: Mild deficits observed, not formally tested                                           Pertinent Vitals/Pain Pain Assessment Pain Assessment: 0-10 Pain Score: 4  Pain Location: right knee Pain Descriptors / Indicators: Grimacing Pain Intervention(s): Monitored during session, Premedicated before session, Repositioned    Home Living Family/patient expects to be discharged to:: Private residence Living Arrangements: Spouse/significant other Available Help at Discharge: Family Type of Home: House Home Access: Stairs to enter Entrance Stairs-Rails: Doctor, General Practice of Steps: 2 Alternate Level Stairs-Number of Steps: 6+6. rails split Home Layout: Multi-level;Bed/bath upstairs;1/2 bath on main level Home Equipment: Agricultural Consultant (2 wheels)      Prior Function Prior Level of Function : Independent/Modified Independent                     Extremity/Trunk Assessment        Lower Extremity Assessment Lower Extremity Assessment: RLE deficits/detail RLE Deficits / Details: + SLR. knee flex 10-70    Cervical / Trunk Assessment Cervical / Trunk Assessment: Normal  Communication        Cognition Arousal: Alert Behavior During Therapy: WFL for tasks assessed/performed   PT - Cognitive impairments: No apparent impairments  Following commands: Intact       Cueing       General Comments      Exercises Total Joint Exercises Ankle Circles/Pumps: AROM Quad Sets: AROM Straight Leg Raises: AROM Long Arc Quad: AROM Knee Flexion: AROM   Assessment/Plan    PT Assessment All further PT needs can be met in the next venue of care  PT Problem List Decreased range of motion;Decreased strength;Decreased activity tolerance       PT Treatment Interventions      PT Goals (Current goals can be found  in the Care Plan section)  Acute Rehab PT Goals Patient Stated Goal: go home PT Goal Formulation: All assessment and education complete, DC therapy    Frequency       Co-evaluation               AM-PAC PT 6 Clicks Mobility  Outcome Measure Help needed turning from your back to your side while in a flat bed without using bedrails?: None Help needed moving from lying on your back to sitting on the side of a flat bed without using bedrails?: None Help needed moving to and from a bed to a chair (including a wheelchair)?: A Little Help needed standing up from a chair using your arms (e.g., wheelchair or bedside chair)?: A Little Help needed to walk in hospital room?: A Little Help needed climbing 3-5 steps with a railing? : A Little 6 Click Score: 20    End of Session Equipment Utilized During Treatment: Gait belt Activity Tolerance: Patient tolerated treatment well Patient left: in bed;with family/visitor present Nurse Communication: Mobility status PT Visit Diagnosis: Unsteadiness on feet (R26.81);Pain Pain - Right/Left: Right Pain - part of body: Knee    Time: 8687-8662 PT Time Calculation (min) (ACUTE ONLY): 25 min   Charges:   PT Evaluation $PT Eval Low Complexity: 1 Low PT Treatments $Gait Training: 8-22 mins PT General Charges $$ ACUTE PT VISIT: 1 Visit         Darice Potters PT Acute Rehabilitation Services Office [phone]   Potters Darice Norris 01/20/2024, 2:53 PM

## 2024-01-20 NOTE — Anesthesia Procedure Notes (Signed)
 Spinal  Patient location during procedure: OR Start time: 01/20/2024 7:18 AM End time: 01/20/2024 7:21 AM Reason for block: surgical anesthesia Staffing Performed: anesthesiologist  Anesthesiologist: Paul Lamarr BRAVO, MD Performed by: Paul Lamarr BRAVO, MD Authorized by: Paul Lamarr BRAVO, MD   Preanesthetic Checklist Completed: patient identified, IV checked, risks and benefits discussed, surgical consent, monitors and equipment checked, pre-op evaluation and timeout performed Spinal Block Patient position: sitting Prep: DuraPrep and site prepped and draped Patient monitoring: continuous pulse ox, blood pressure and heart rate Approach: midline Location: L3-4 Injection technique: single-shot Needle Needle type: Pencan  Needle gauge: 24 G Needle length: 9 cm Assessment Events: CSF return Additional Notes Risks, benefits, and alternative discussed. Patient gave consent to procedure. Prepped and draped in sitting position. Patient sedated but responsive to voice. Clear CSF obtained after one needle pass. Positive terminal aspiration. No pain or paraesthesias with injection. Patient tolerated procedure well. Vital signs stable. LANEY Paul, MD

## 2024-01-21 ENCOUNTER — Encounter (HOSPITAL_COMMUNITY): Payer: Self-pay | Admitting: Orthopedic Surgery

## 2024-02-02 ENCOUNTER — Ambulatory Visit: Admitting: Cardiovascular Disease

## 2024-02-04 ENCOUNTER — Other Ambulatory Visit: Payer: Self-pay | Admitting: Internal Medicine

## 2024-02-05 ENCOUNTER — Encounter: Admitting: Neurology

## 2024-02-23 ENCOUNTER — Ambulatory Visit: Admitting: Cardiovascular Disease

## 2024-02-26 DIAGNOSIS — Z5189 Encounter for other specified aftercare: Secondary | ICD-10-CM | POA: Diagnosis not present

## 2024-03-02 DIAGNOSIS — D485 Neoplasm of uncertain behavior of skin: Secondary | ICD-10-CM | POA: Diagnosis not present

## 2024-03-04 ENCOUNTER — Encounter: Admitting: Neurology

## 2024-03-08 ENCOUNTER — Ambulatory Visit (HOSPITAL_COMMUNITY)

## 2024-03-09 ENCOUNTER — Ambulatory Visit (HOSPITAL_COMMUNITY)
Admission: RE | Admit: 2024-03-09 | Discharge: 2024-03-09 | Disposition: A | Source: Ambulatory Visit | Attending: Emergency Medicine

## 2024-03-09 DIAGNOSIS — I2510 Atherosclerotic heart disease of native coronary artery without angina pectoris: Secondary | ICD-10-CM | POA: Diagnosis not present

## 2024-03-09 DIAGNOSIS — E782 Mixed hyperlipidemia: Secondary | ICD-10-CM

## 2024-03-09 DIAGNOSIS — I773 Arterial fibromuscular dysplasia: Secondary | ICD-10-CM | POA: Insufficient documentation

## 2024-03-09 DIAGNOSIS — I6521 Occlusion and stenosis of right carotid artery: Secondary | ICD-10-CM | POA: Diagnosis not present

## 2024-03-10 ENCOUNTER — Ambulatory Visit: Payer: Self-pay | Admitting: Emergency Medicine

## 2024-03-10 ENCOUNTER — Encounter: Admitting: Internal Medicine

## 2024-03-10 DIAGNOSIS — Z947 Corneal transplant status: Secondary | ICD-10-CM | POA: Diagnosis not present

## 2024-03-10 DIAGNOSIS — H2513 Age-related nuclear cataract, bilateral: Secondary | ICD-10-CM | POA: Diagnosis not present

## 2024-03-15 DIAGNOSIS — M1712 Unilateral primary osteoarthritis, left knee: Secondary | ICD-10-CM | POA: Diagnosis not present

## 2024-03-15 DIAGNOSIS — Z96651 Presence of right artificial knee joint: Secondary | ICD-10-CM | POA: Diagnosis not present

## 2024-04-22 ENCOUNTER — Ambulatory Visit: Admitting: Neurology

## 2024-04-22 DIAGNOSIS — R202 Paresthesia of skin: Secondary | ICD-10-CM

## 2024-04-22 NOTE — Procedures (Signed)
 " [facility]  [address] [place], [address]  [place], [place] [address] Tel: [phone] Fax: [phone] Test Date:  04/22/2024  Patient: Eric Dillon DOB: [birth year] Physician: Tonita Blanch, DO  Sex: Male Height: 5' 7 Ref Phys: Tonita Blanch, DO  ID#: [id number]   Technician:    History: This is a 69 year old male referred for evaluation of bilateral feet paresthesias.  NCV & EMG Findings: Electrodiagnostic testing of the right lower extremity and additional studies of the left shows: Bilateral sural and superficial peroneal sensory responses are within normal limits. Bilateral peroneal and tibial motor responses are within normal limits. Bilateral tibial H reflex studies are within normal limits. There is no evidence of active or chronic motor axonal changes affecting any of the tested muscles.  Motor unit configuration and recruitment pattern is within normal limits.  Impression: This is a normal study of the lower extremities.  In particular, there is no evidence of a large fiber sensorimotor polyneuropathy or lumbosacral radiculopathy.    ___________________________ Tonita Blanch, DO    Nerve Conduction Studies   Stim Site NR Peak (ms) Norm Peak (ms) O-P Amp (V) Norm O-P Amp  Left Sup Peroneal Anti Sensory (Ant Lat Mall)  32 C  12 cm    2.5 <4.6 9.4 >3  Right Sup Peroneal Anti Sensory (Ant Lat Mall)  32 C  12 cm    2.6 <4.6 7.2 >3  Left Sural Anti Sensory (Lat Mall)  32 C  Calf    2.6 <4.6 13.9 >3  Right Sural Anti Sensory (Lat Mall)  32 C  Calf    2.2 <4.6 10.4 >3     Stim Site NR Onset (ms) Norm Onset (ms) O-P Amp (mV) Norm O-P Amp Site1 Site2 Delta-0 (ms) Dist (cm) Vel (m/s) Norm Vel (m/s)  Left Peroneal Motor (Ext Dig Brev)  32 C  Ankle    3.4 <6.0 5.6 >2.5 B Fib Ankle 6.8 36.0 53 >40  B Fib    10.2  5.4  Poplt B Fib 1.2 7.0 58 >40  Poplt    11.4  5.3         Right Peroneal Motor (Ext Dig Brev)  32 C  Ankle    0.9 <6.0 6.0 >2.5 B Fib Ankle  8.5 36.0 42 >40  B Fib    9.4  5.4  Poplt B Fib 1.5 8.0 53 >40  Poplt    10.9  5.5         Left Tibial Motor (Abd Hall Brev)  32 C  Ankle    3.4 <6.0 11.8 >4 Knee Ankle 7.9 40.0 51 >40  Knee    11.3  7.8         Right Tibial Motor (Abd Hall Brev)  32 C  Ankle    3.4 <6.0 12.0 >4 Knee Ankle 7.9 42.0 53 >40  Knee    11.3  8.4          Electromyography   Side Muscle Ins.Act Fibs Fasc Recrt Amp Dur Poly Activation Comment  Right AntTibialis Nml Nml Nml Nml Nml Nml Nml Nml N/A  Right Gastroc Nml Nml Nml Nml Nml Nml Nml Nml N/A  Right Flex Dig Long Nml Nml Nml Nml Nml Nml Nml Nml N/A  Right RectFemoris Nml Nml Nml Nml Nml Nml Nml Nml N/A  Right BicepsFemS Nml Nml Nml Nml Nml Nml Nml Nml N/A  Right GluteusMed Nml Nml Nml Nml Nml Nml Nml Nml N/A  Left  AntTibialis Nml Nml Nml Nml Nml Nml Nml Nml N/A  Left Gastroc Nml Nml Nml Nml Nml Nml Nml Nml N/A  Left Flex Dig Long Nml Nml Nml Nml Nml Nml Nml Nml N/A  Left RectFemoris Nml Nml Nml Nml Nml Nml Nml Nml N/A  Left GluteusMed Nml Nml Nml Nml Nml Nml Nml Nml N/A  Left BicepsFemS Nml Nml Nml Nml Nml Nml Nml Nml N/A      Waveforms:                         "

## 2024-04-26 ENCOUNTER — Ambulatory Visit: Admitting: Neurology

## 2024-07-07 ENCOUNTER — Ambulatory Visit: Admitting: Internal Medicine

## 2024-12-24 ENCOUNTER — Ambulatory Visit
